# Patient Record
Sex: Female | Born: 1990 | Race: Black or African American | Hispanic: No | Marital: Single | Smoking: Never smoker
Health system: Southern US, Community
[De-identification: ages and names within clinical notes are randomized; demographics above are authoritative.]

## PROBLEM LIST (undated history)

## (undated) ENCOUNTER — Inpatient Hospital Stay (HOSPITAL_COMMUNITY): Payer: Self-pay

## (undated) DIAGNOSIS — Z789 Other specified health status: Secondary | ICD-10-CM

## (undated) DIAGNOSIS — N390 Urinary tract infection, site not specified: Secondary | ICD-10-CM

## (undated) DIAGNOSIS — Z8619 Personal history of other infectious and parasitic diseases: Secondary | ICD-10-CM

## (undated) HISTORY — PX: NO PAST SURGERIES: SHX2092

---
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
## 2011-03-03 LAB — URINALYSIS, ROUTINE W REFLEX MICROSCOPIC
Ketones, ur: NEGATIVE mg/dL
Nitrite: NEGATIVE
Protein, ur: NEGATIVE mg/dL
Urobilinogen, UA: 0.2 mg/dL (ref 0.0–1.0)

## 2011-03-03 LAB — WET PREP, GENITAL

## 2011-03-03 LAB — CBC
HCT: 32.2 % — ABNORMAL LOW (ref 36.0–49.0)
Hemoglobin: 10.6 g/dL — ABNORMAL LOW (ref 12.0–16.0)
RBC: 3.9 MIL/uL (ref 3.80–5.70)
RDW: 13.4 % (ref 11.4–15.5)
WBC: 4.7 10*3/uL (ref 4.5–13.5)

## 2011-03-11 LAB — RAPID STREP SCREEN (MED CTR MEBANE ONLY): Streptococcus, Group A Screen (Direct): NEGATIVE

## 2011-04-02 LAB — URINE CULTURE
Colony Count: NO GROWTH
Culture: NO GROWTH

## 2011-04-02 LAB — URINALYSIS, ROUTINE W REFLEX MICROSCOPIC
Bilirubin Urine: NEGATIVE
Bilirubin Urine: NEGATIVE
Glucose, UA: NEGATIVE mg/dL
Hgb urine dipstick: NEGATIVE
Ketones, ur: NEGATIVE mg/dL
Ketones, ur: NEGATIVE mg/dL
Protein, ur: NEGATIVE mg/dL
Urobilinogen, UA: 0.2 mg/dL (ref 0.0–1.0)
pH: 6 (ref 5.0–8.0)

## 2011-04-02 LAB — WET PREP, GENITAL
Clue Cells Wet Prep HPF POC: NONE SEEN
Trich, Wet Prep: NONE SEEN

## 2011-04-02 LAB — URINE MICROSCOPIC-ADD ON

## 2011-04-02 LAB — POCT PREGNANCY, URINE: Preg Test, Ur: POSITIVE

## 2011-04-07 LAB — WET PREP, GENITAL: Trich, Wet Prep: NONE SEEN

## 2011-04-07 LAB — URINE MICROSCOPIC-ADD ON

## 2011-04-07 LAB — URINALYSIS, ROUTINE W REFLEX MICROSCOPIC
Hgb urine dipstick: NEGATIVE
Ketones, ur: 15 mg/dL — AB
Protein, ur: 100 mg/dL — AB
Urobilinogen, UA: 2 mg/dL — ABNORMAL HIGH (ref 0.0–1.0)

## 2011-04-07 LAB — URINE CULTURE: Colony Count: 7000

## 2011-04-07 LAB — RAPID STREP SCREEN (MED CTR MEBANE ONLY): Streptococcus, Group A Screen (Direct): NEGATIVE

## 2011-04-07 LAB — GC/CHLAMYDIA PROBE AMP, GENITAL: Chlamydia, DNA Probe: POSITIVE — AB

## 2011-07-18 ENCOUNTER — Emergency Department (HOSPITAL_COMMUNITY)
Admission: EM | Admit: 2011-07-18 | Discharge: 2011-07-19 | Disposition: A | Discharge status: Home / Self Care | Payer: Medicaid Other | Attending: Emergency Medicine | Admitting: Emergency Medicine

## 2011-07-18 DIAGNOSIS — R109 Unspecified abdominal pain: Secondary | ICD-10-CM | POA: Insufficient documentation

## 2011-07-18 DIAGNOSIS — R300 Dysuria: Secondary | ICD-10-CM | POA: Insufficient documentation

## 2011-07-18 DIAGNOSIS — N390 Urinary tract infection, site not specified: Secondary | ICD-10-CM | POA: Insufficient documentation

## 2011-07-18 LAB — URINE MICROSCOPIC-ADD ON

## 2011-07-18 LAB — URINALYSIS, ROUTINE W REFLEX MICROSCOPIC
Bilirubin Urine: NEGATIVE
Glucose, UA: NEGATIVE mg/dL
Ketones, ur: 15 mg/dL — AB
Protein, ur: 100 mg/dL — AB
Urobilinogen, UA: 1 mg/dL (ref 0.0–1.0)

## 2011-09-17 ENCOUNTER — Emergency Department (HOSPITAL_COMMUNITY)
Admission: EM | Admit: 2011-09-17 | Discharge: 2011-09-18 | Disposition: A | Discharge status: Home / Self Care | Payer: Medicaid Other | Attending: Emergency Medicine | Admitting: Emergency Medicine

## 2011-09-17 DIAGNOSIS — R109 Unspecified abdominal pain: Secondary | ICD-10-CM | POA: Insufficient documentation

## 2011-09-17 DIAGNOSIS — N390 Urinary tract infection, site not specified: Secondary | ICD-10-CM | POA: Insufficient documentation

## 2011-09-17 DIAGNOSIS — A499 Bacterial infection, unspecified: Secondary | ICD-10-CM | POA: Insufficient documentation

## 2011-09-17 DIAGNOSIS — O219 Vomiting of pregnancy, unspecified: Secondary | ICD-10-CM | POA: Insufficient documentation

## 2011-09-17 DIAGNOSIS — O2390 Unspecified genitourinary tract infection in pregnancy, unspecified trimester: Secondary | ICD-10-CM | POA: Insufficient documentation

## 2011-09-17 DIAGNOSIS — N760 Acute vaginitis: Secondary | ICD-10-CM | POA: Insufficient documentation

## 2011-09-17 DIAGNOSIS — B9689 Other specified bacterial agents as the cause of diseases classified elsewhere: Secondary | ICD-10-CM | POA: Insufficient documentation

## 2011-09-17 LAB — URINALYSIS, ROUTINE W REFLEX MICROSCOPIC
Bilirubin Urine: NEGATIVE
Nitrite: NEGATIVE
Specific Gravity, Urine: 1.011 (ref 1.005–1.030)
Urobilinogen, UA: 1 mg/dL (ref 0.0–1.0)
pH: 6.5 (ref 5.0–8.0)

## 2011-09-17 LAB — URINE MICROSCOPIC-ADD ON

## 2011-09-17 LAB — POCT I-STAT, CHEM 8
BUN: 5 mg/dL — ABNORMAL LOW (ref 6–23)
Chloride: 104 mEq/L (ref 96–112)
HCT: 35 % — ABNORMAL LOW (ref 36.0–46.0)
Potassium: 3.6 mEq/L (ref 3.5–5.1)
Sodium: 137 mEq/L (ref 135–145)

## 2011-09-17 LAB — POCT PREGNANCY, URINE: Preg Test, Ur: POSITIVE

## 2011-09-18 ENCOUNTER — Emergency Department (HOSPITAL_COMMUNITY): Payer: Medicaid Other

## 2011-09-18 LAB — WET PREP, GENITAL

## 2011-09-19 LAB — GC/CHLAMYDIA PROBE AMP, GENITAL
Chlamydia, DNA Probe: NEGATIVE
GC Probe Amp, Genital: NEGATIVE

## 2011-09-20 LAB — URINE CULTURE

## 2011-12-22 NOTE — L&D Delivery Note (Signed)
Delivery Note At 7:42 PM a viable female was delivered via Vaginal, Spontaneous Delivery (Presentation: ; Occiput Anterior).  APGAR: 8, 9; weight .   Placenta status: Intact, Spontaneous.  Cord:  with the following complications: None.  Cord pH: none  Anesthesia: Epidural  Episiotomy: None Lacerations: None Suture Repair: none Est. Blood Loss (mL): 350  Mom to postpartum.  Baby to nursery-stable.  HARPER,CHARLES A 09/02/2012, 7:59 PM

## 2012-02-29 ENCOUNTER — Encounter (HOSPITAL_COMMUNITY): Payer: Self-pay | Admitting: *Deleted

## 2012-02-29 ENCOUNTER — Emergency Department (HOSPITAL_COMMUNITY)
Admission: EM | Admit: 2012-02-29 | Discharge: 2012-03-01 | Disposition: A | Discharge status: Home / Self Care | Payer: Medicaid Other | Attending: Emergency Medicine | Admitting: Emergency Medicine

## 2012-02-29 DIAGNOSIS — O2390 Unspecified genitourinary tract infection in pregnancy, unspecified trimester: Secondary | ICD-10-CM | POA: Insufficient documentation

## 2012-02-29 DIAGNOSIS — O2340 Unspecified infection of urinary tract in pregnancy, unspecified trimester: Secondary | ICD-10-CM

## 2012-02-29 DIAGNOSIS — N390 Urinary tract infection, site not specified: Secondary | ICD-10-CM | POA: Insufficient documentation

## 2012-02-29 NOTE — ED Notes (Signed)
The pt has had vomiting and diarrhea for 2 weeks intermittently with abd pain.  lmp  unknown

## 2012-03-01 LAB — DIFFERENTIAL
Basophils Absolute: 0 10*3/uL (ref 0.0–0.1)
Eosinophils Relative: 1 % (ref 0–5)
Lymphocytes Relative: 27 % (ref 12–46)
Lymphs Abs: 2.6 10*3/uL (ref 0.7–4.0)
Monocytes Absolute: 0.8 10*3/uL (ref 0.1–1.0)
Neutro Abs: 6.1 10*3/uL (ref 1.7–7.7)

## 2012-03-01 LAB — CBC
HCT: 32.4 % — ABNORMAL LOW (ref 36.0–46.0)
MCV: 81 fL (ref 78.0–100.0)
Platelets: 208 10*3/uL (ref 150–400)
RBC: 4 MIL/uL (ref 3.87–5.11)
WBC: 9.7 10*3/uL (ref 4.0–10.5)

## 2012-03-01 LAB — URINALYSIS, ROUTINE W REFLEX MICROSCOPIC
Glucose, UA: NEGATIVE mg/dL
Hgb urine dipstick: NEGATIVE
Specific Gravity, Urine: 1.029 (ref 1.005–1.030)

## 2012-03-01 LAB — URINE MICROSCOPIC-ADD ON

## 2012-03-01 LAB — BASIC METABOLIC PANEL
CO2: 22 mEq/L (ref 19–32)
Calcium: 9.4 mg/dL (ref 8.4–10.5)
Chloride: 102 mEq/L (ref 96–112)
Glucose, Bld: 97 mg/dL (ref 70–99)
Sodium: 135 mEq/L (ref 135–145)

## 2012-03-01 LAB — POCT PREGNANCY, URINE: Preg Test, Ur: POSITIVE — AB

## 2012-03-01 MED ORDER — NITROFURANTOIN MONOHYD MACRO 100 MG PO CAPS: 100.0000 mg | ORAL_CAPSULE | Freq: Two times a day (BID) | ORAL | Status: AC

## 2012-03-01 NOTE — ED Provider Notes (Signed)
History     CSN: [id number]  Arrival date & time 02/29/12  2342   First MD Initiated Contact with Patient 03/01/12 0225      Chief Complaint  Patient presents with  . Abdominal Pain    HPI Patient presents with nausea and vomiting.  She notes her symptoms began gradually couple weeks ago.  Since onset there has been intermittent, with emesis.  She has mild anorexia.  There is intermittent diarrhea as well.  No fevers, no chills, no dysuria.  She notes mild intermittent lower abdominal pain, focally in the suprapubic region, dull.  No attempts at analgesia with OTC medication, no clear exacerbating, provoking, alleviating factors.  The patient notes her last menstrual period was 2 months ago.  She has one prior pregnancy. History reviewed. No pertinent past medical history.  History reviewed. No pertinent past surgical history.  History reviewed. No pertinent family history.  History  Substance Use Topics  . Smoking status: Not on file  . Smokeless tobacco: Not on file  . Alcohol Use: No    OB History    Grav Para Term Preterm Abortions TAB SAB Ect Mult Living                  Review of Systems  All other systems reviewed and are negative.    Allergies  Penicillins  Home Medications  No current outpatient prescriptions on file.  BP 115/62  Pulse 86  Temp(Src) 99.4 F (37.4 C) (Oral)  Resp 18  SpO2 100%  Physical Exam  Nursing note and vitals reviewed. Constitutional: She is oriented to person, place, and time. She appears well-developed and well-nourished. No distress.  HENT:  Head: Normocephalic and atraumatic.  Eyes: Conjunctivae and EOM are normal.  Cardiovascular: Normal rate and regular rhythm.   Pulmonary/Chest: Effort normal and breath sounds normal. No stridor. No respiratory distress.  Abdominal: She exhibits no distension.  Musculoskeletal: She exhibits no edema.  Neurological: She is alert and oriented to person, place, and time. No cranial  nerve deficit.  Skin: Skin is warm and dry.  Psychiatric: She has a normal mood and affect.    ED Course  Procedures (including critical care time)  Labs Reviewed  URINALYSIS, ROUTINE W REFLEX MICROSCOPIC - Abnormal; Notable for the following:    APPearance CLOUDY (*)    Leukocytes, UA SMALL (*)    All other components within normal limits  CBC - Abnormal; Notable for the following:    Hemoglobin 10.9 (*)    HCT 32.4 (*)    All other components within normal limits  POCT PREGNANCY, URINE - Abnormal; Notable for the following:    Preg Test, Ur POSITIVE (*)    All other components within normal limits  URINE MICROSCOPIC-ADD ON - Abnormal; Notable for the following:    Bacteria, UA FEW (*)    All other components within normal limits  DIFFERENTIAL  BASIC METABOLIC PANEL   No results found.   No diagnosis found.  Bedside ultrasound demonstrates IUP with fetus moving freely.  Fetal heart tones 180.  MDM  This generally well-appearing 21 year old female presents with nausea, vomiting, minor abdominal pain.  On exam she is in no distress with unremarkable vital signs.  The patient's labs are notable for a positive urine pregnancy results.  This was discussed with the patient, who notes that she is not surprised.  The patient also has a urinary tract infection.  She was discharged in stable condition with instructions to follow  up at [facility].  She was provided antibiotics.       Gerhard Munch, MD 03/01/12 [phone]

## 2012-03-02 ENCOUNTER — Encounter (HOSPITAL_COMMUNITY): Payer: Self-pay | Admitting: Obstetrics and Gynecology

## 2012-03-02 ENCOUNTER — Inpatient Hospital Stay (HOSPITAL_COMMUNITY): Admission: AD | Admit: 2012-03-02 | Payer: Medicaid Other | Admitting: Obstetrics & Gynecology

## 2012-04-27 ENCOUNTER — Encounter (HOSPITAL_COMMUNITY): Payer: Self-pay | Admitting: *Deleted

## 2012-04-27 ENCOUNTER — Inpatient Hospital Stay (HOSPITAL_COMMUNITY)
Admission: AD | Admit: 2012-04-27 | Discharge: 2012-04-28 | Disposition: A | Discharge status: Home / Self Care | Payer: Medicaid Other | Source: Ambulatory Visit | Attending: Obstetrics & Gynecology | Admitting: Obstetrics & Gynecology

## 2012-04-27 DIAGNOSIS — R300 Dysuria: Secondary | ICD-10-CM | POA: Insufficient documentation

## 2012-04-27 DIAGNOSIS — O99891 Other specified diseases and conditions complicating pregnancy: Secondary | ICD-10-CM | POA: Insufficient documentation

## 2012-04-27 DIAGNOSIS — O9989 Other specified diseases and conditions complicating pregnancy, childbirth and the puerperium: Secondary | ICD-10-CM

## 2012-04-27 DIAGNOSIS — N949 Unspecified condition associated with female genital organs and menstrual cycle: Secondary | ICD-10-CM

## 2012-04-27 DIAGNOSIS — R109 Unspecified abdominal pain: Secondary | ICD-10-CM | POA: Insufficient documentation

## 2012-04-27 DIAGNOSIS — R102 Pelvic and perineal pain: Secondary | ICD-10-CM

## 2012-04-27 HISTORY — DX: Other specified health status: Z78.9

## 2012-04-27 NOTE — MAU Note (Signed)
Pt states she is having back pain that started about 3wks ago. Pt states after she works she feels pressure, because she is working now. Pt states she wants to make sure everything is ok

## 2012-04-27 NOTE — MAU Note (Signed)
Pt presents with complaint of sharp pain off/on x 1 month, has not started care yet. Was seen at [facility] 2 months ago and was told she had a UTI but was unable to get meds filled .

## 2012-04-28 LAB — URINALYSIS, ROUTINE W REFLEX MICROSCOPIC
Bilirubin Urine: NEGATIVE
Ketones, ur: NEGATIVE mg/dL
Leukocytes, UA: NEGATIVE
Nitrite: NEGATIVE
Protein, ur: NEGATIVE mg/dL
Urobilinogen, UA: 0.2 mg/dL (ref 0.0–1.0)
pH: 6 (ref 5.0–8.0)

## 2012-04-28 NOTE — MAU Provider Note (Signed)
  History    CSN: [id number]  Arrival date and time: 04/27/12 2209  Chief Complaint  Patient presents with  . Abdominal Pain   HPI 21 y.o. G2P1001 at [redacted]w[redacted]d who complains of lower abdominal pain and dysuria. No fevers, hematuria, flank pain or other concerns.  Was seen in March 2013 in the ED and given Rx for a UTI even though her UA was negative. She reprots similar symptoms to that episode. Denies vaginal bleeding, discharge or other symptoms. No PNC yet, will contact [facility] soon.   Past Medical History  Diagnosis Date  . No pertinent past medical history     Past Surgical History  Procedure Date  . No past surgeries     No family history on file.  History  Substance Use Topics  . Smoking status: Former Smoker    Quit date: 08/29/2011  . Smokeless tobacco: Not on file  . Alcohol Use: No   Allergies:  Allergies  Allergen Reactions  . Penicillins Other (See Comments)    unknown    Prescriptions prior to admission  Medication Sig Dispense Refill  . Prenatal Vit-Fe Fumarate-FA (PRENATAL MULTIVITAMIN) TABS Take 1 tablet by mouth daily.        ROS Physical Exam   Blood pressure 114/59, pulse 81, temperature 98.4 F (36.9 C), temperature source Oral, resp. rate 18, SpO2 100.00%.  Reassuring FHR on doppler.  Physical Exam  Constitutional: She is oriented to person, place, and time.  GI: Soft. Bowel sounds are normal. She exhibits no distension. There is tenderness.       Moderate suprapubic pain  Genitourinary:       Normal bimanual exam. Cervix long/thick/closed  Neurological: She is alert and oriented to person, place, and time.    MAU Course  Procedures  Results for orders placed during the hospital encounter of 04/27/12 (from the past 24 hour(s))  URINALYSIS, ROUTINE W REFLEX MICROSCOPIC     Status: Normal   Collection Time   04/27/12 10:30 PM      Component Value Range   Color, Urine YELLOW  YELLOW    APPearance CLEAR  CLEAR    Specific Gravity, Urine  1.025  1.005 - 1.030    pH 6.0  5.0 - 8.0    Glucose, UA NEGATIVE  NEGATIVE (mg/dL)   Hgb urine dipstick NEGATIVE  NEGATIVE    Bilirubin Urine NEGATIVE  NEGATIVE    Ketones, ur NEGATIVE  NEGATIVE (mg/dL)   Protein, ur NEGATIVE  NEGATIVE (mg/dL)   Urobilinogen, UA 0.2  0.0 - 1.0 (mg/dL)   Nitrite NEGATIVE  NEGATIVE    Leukocytes, UA NEGATIVE  NEGATIVE     Assessment and Plan  No evidence of UTI or pretem labor.  Precautions removed. Likely round ligament pain Start prenatal care   Keylan Costabile A 04/28/2012, 12:35 AM

## 2012-04-28 NOTE — Discharge Instructions (Signed)

## 2012-06-02 ENCOUNTER — Other Ambulatory Visit: Payer: Self-pay | Admitting: Obstetrics

## 2012-06-02 DIAGNOSIS — Z3480 Encounter for supervision of other normal pregnancy, unspecified trimester: Secondary | ICD-10-CM

## 2012-06-02 LAB — OB RESULTS CONSOLE ABO/RH: RH Type: NEGATIVE

## 2012-06-02 LAB — OB RESULTS CONSOLE RPR: RPR: NONREACTIVE

## 2012-06-02 LAB — OB RESULTS CONSOLE GC/CHLAMYDIA
Chlamydia: NEGATIVE
Gonorrhea: NEGATIVE

## 2012-06-02 LAB — OB RESULTS CONSOLE HEPATITIS B SURFACE ANTIGEN: Hepatitis B Surface Ag: NEGATIVE

## 2012-06-08 ENCOUNTER — Ambulatory Visit (HOSPITAL_COMMUNITY)
Admission: RE | Admit: 2012-06-08 | Discharge: 2012-06-08 | Disposition: A | Discharge status: Home / Self Care | Payer: Medicaid Other | Source: Ambulatory Visit | Attending: Obstetrics | Admitting: Obstetrics

## 2012-06-08 DIAGNOSIS — Z1389 Encounter for screening for other disorder: Secondary | ICD-10-CM | POA: Insufficient documentation

## 2012-06-08 DIAGNOSIS — Z3480 Encounter for supervision of other normal pregnancy, unspecified trimester: Secondary | ICD-10-CM

## 2012-06-08 DIAGNOSIS — O0930 Supervision of pregnancy with insufficient antenatal care, unspecified trimester: Secondary | ICD-10-CM | POA: Insufficient documentation

## 2012-06-08 DIAGNOSIS — O358XX0 Maternal care for other (suspected) fetal abnormality and damage, not applicable or unspecified: Secondary | ICD-10-CM | POA: Insufficient documentation

## 2012-06-08 DIAGNOSIS — Z363 Encounter for antenatal screening for malformations: Secondary | ICD-10-CM | POA: Insufficient documentation

## 2012-06-25 ENCOUNTER — Inpatient Hospital Stay (HOSPITAL_COMMUNITY)
Admission: AD | Admit: 2012-06-25 | Discharge: 2012-06-25 | Disposition: A | Discharge status: Home / Self Care | Payer: Medicaid Other | Source: Ambulatory Visit | Attending: Obstetrics | Admitting: Obstetrics

## 2012-06-25 DIAGNOSIS — Z3480 Encounter for supervision of other normal pregnancy, unspecified trimester: Secondary | ICD-10-CM | POA: Insufficient documentation

## 2012-06-25 DIAGNOSIS — Z298 Encounter for other specified prophylactic measures: Secondary | ICD-10-CM | POA: Insufficient documentation

## 2012-06-25 DIAGNOSIS — Z2989 Encounter for other specified prophylactic measures: Secondary | ICD-10-CM | POA: Insufficient documentation

## 2012-06-25 MED ORDER — RHO D IMMUNE GLOBULIN 1500 UNIT/2ML IJ SOLN
300.0000 ug | Freq: Once | INTRAMUSCULAR | Status: AC
  Administered 2012-06-25: 300 ug via INTRAMUSCULAR
  Filled 2012-06-25: qty 2

## 2012-06-26 LAB — RH IG WORKUP (INCLUDES ABO/RH)

## 2012-08-07 LAB — OB RESULTS CONSOLE GBS: GBS: NEGATIVE

## 2012-08-23 ENCOUNTER — Encounter (HOSPITAL_COMMUNITY): Payer: Self-pay | Admitting: *Deleted

## 2012-08-23 ENCOUNTER — Inpatient Hospital Stay (HOSPITAL_COMMUNITY)
Admission: AD | Admit: 2012-08-23 | Discharge: 2012-08-24 | Disposition: A | Discharge status: Home / Self Care | Payer: Medicaid Other | Source: Ambulatory Visit | Attending: Obstetrics | Admitting: Obstetrics

## 2012-08-23 DIAGNOSIS — O479 False labor, unspecified: Secondary | ICD-10-CM | POA: Insufficient documentation

## 2012-08-23 NOTE — MAU Note (Signed)
Pt G2 P1 at 39.3wks reports leaking fluid x 1 hr and having contractions every 25 min.

## 2012-08-31 ENCOUNTER — Encounter (HOSPITAL_COMMUNITY): Payer: Self-pay

## 2012-08-31 ENCOUNTER — Inpatient Hospital Stay (HOSPITAL_COMMUNITY)
Admission: RE | Admit: 2012-08-31 | Discharge: 2012-09-04 | DRG: 775 | Disposition: A | Discharge status: Home / Self Care | Payer: Medicaid Other | Source: Ambulatory Visit | Attending: Obstetrics & Gynecology | Admitting: Obstetrics & Gynecology

## 2012-08-31 ENCOUNTER — Other Ambulatory Visit: Payer: Self-pay | Admitting: Obstetrics

## 2012-08-31 ENCOUNTER — Telehealth (HOSPITAL_COMMUNITY): Payer: Self-pay | Admitting: *Deleted

## 2012-08-31 ENCOUNTER — Encounter (HOSPITAL_COMMUNITY): Payer: Self-pay | Admitting: *Deleted

## 2012-08-31 DIAGNOSIS — O480 Post-term pregnancy: Principal | ICD-10-CM | POA: Diagnosis present

## 2012-08-31 LAB — CBC
HCT: 31.4 % — ABNORMAL LOW (ref 36.0–46.0)
MCHC: 32.8 g/dL (ref 30.0–36.0)
MCV: 87.5 fL (ref 78.0–100.0)
RDW: 14.1 % (ref 11.5–15.5)

## 2012-08-31 MED ORDER — LACTATED RINGERS IV SOLN: 500.0000 mL | INTRAVENOUS | Status: DC | PRN

## 2012-08-31 MED ORDER — MISOPROSTOL 25 MCG QUARTER TABLET
25.0000 ug | ORAL_TABLET | ORAL | Status: DC | PRN
  Administered 2012-08-31: 25 ug via VAGINAL
  Filled 2012-08-31: qty 1
  Filled 2012-08-31: qty 0.25

## 2012-08-31 MED ORDER — IBUPROFEN 600 MG PO TABS
600.0000 mg | ORAL_TABLET | Freq: Four times a day (QID) | ORAL | Status: DC | PRN
  Administered 2012-09-02: 600 mg via ORAL
  Filled 2012-08-31: qty 1

## 2012-08-31 MED ORDER — ONDANSETRON HCL 4 MG/2ML IJ SOLN: 4.0000 mg | Freq: Four times a day (QID) | INTRAMUSCULAR | Status: DC | PRN

## 2012-08-31 MED ORDER — CITRIC ACID-SODIUM CITRATE 334-500 MG/5ML PO SOLN: 30.0000 mL | ORAL | Status: DC | PRN

## 2012-08-31 MED ORDER — ZOLPIDEM TARTRATE 5 MG PO TABS
5.0000 mg | ORAL_TABLET | Freq: Every evening | ORAL | Status: DC | PRN
  Administered 2012-08-31: 5 mg via ORAL
  Filled 2012-08-31: qty 1

## 2012-08-31 MED ORDER — TERBUTALINE SULFATE 1 MG/ML IJ SOLN: 0.2500 mg | Freq: Once | INTRAMUSCULAR | Status: AC | PRN

## 2012-08-31 MED ORDER — OXYCODONE-ACETAMINOPHEN 5-325 MG PO TABS: 1.0000 | ORAL_TABLET | ORAL | Status: DC | PRN

## 2012-08-31 MED ORDER — OXYTOCIN BOLUS FROM INFUSION
500.0000 mL | Freq: Once | INTRAVENOUS | Status: DC
  Filled 2012-08-31: qty 500

## 2012-08-31 MED ORDER — LIDOCAINE HCL (PF) 1 % IJ SOLN
30.0000 mL | INTRAMUSCULAR | Status: AC | PRN
  Administered 2012-08-31: 30 mL via SUBCUTANEOUS
  Filled 2012-08-31 (×2): qty 30

## 2012-08-31 MED ORDER — ACETAMINOPHEN 325 MG PO TABS: 650.0000 mg | ORAL_TABLET | ORAL | Status: DC | PRN

## 2012-08-31 MED ORDER — OXYTOCIN 40 UNITS IN LACTATED RINGERS INFUSION - SIMPLE MED: 62.5000 mL/h | Freq: Once | INTRAVENOUS | Status: DC

## 2012-08-31 MED ORDER — LACTATED RINGERS IV SOLN
INTRAVENOUS | Status: DC
  Administered 2012-08-31 – 2012-09-02 (×3): via INTRAVENOUS

## 2012-08-31 NOTE — Telephone Encounter (Signed)
Preadmission screen  

## 2012-09-01 ENCOUNTER — Encounter (HOSPITAL_COMMUNITY): Payer: Self-pay

## 2012-09-01 LAB — TYPE AND SCREEN
ABO/RH(D): A NEG
Antibody Screen: POSITIVE
DAT, IgG: NEGATIVE

## 2012-09-01 MED ORDER — BUTORPHANOL TARTRATE 2 MG/ML IJ SOLN
2.0000 mg | INTRAMUSCULAR | Status: DC | PRN
  Administered 2012-09-01 – 2012-09-02 (×7): 2 mg via INTRAVENOUS
  Filled 2012-09-01 (×2): qty 2
  Filled 2012-09-01: qty 1
  Filled 2012-09-01: qty 2
  Filled 2012-09-01: qty 1
  Filled 2012-09-01 (×3): qty 2
  Filled 2012-09-01: qty 1

## 2012-09-01 NOTE — H&P (Signed)
Krystal Murray is a 21 y.o. female presenting for IOL. Maternal Medical History:  Reason for admission: 21 yo G3 P1.  EDC 08-27-12.  Presents for IOL for postdates.  Fetal activity: Perceived fetal activity is normal.   Last perceived fetal movement was within the past hour.    Prenatal complications: no prenatal complications   OB History    Grav Para Term Preterm Abortions TAB SAB Ect Mult Living   3 1 1  0 1 1 0 0 0 1     Past Medical History  Diagnosis Date  . No pertinent past medical history    Past Surgical History  Procedure Date  . No past surgeries    Family History: family history includes Cancer in her cousin, maternal aunt, and maternal grandmother; Diabetes in her maternal aunt; and Hypertension in her maternal aunt. Social History:  reports that she quit smoking about a year ago. She has never used smokeless tobacco. She reports that she does not drink alcohol or use illicit drugs.   Prenatal Transfer Tool  Maternal Diabetes: No Genetic Screening: Normal Maternal Ultrasounds/Referrals: Normal Fetal Ultrasounds or other Referrals:  None Maternal Substance Abuse:  No Significant Maternal Medications:  Meds include: Other:  PNV Significant Maternal Lab Results:  Lab values include: Group B Strep negative Other Comments:  None  Review of Systems  All other systems reviewed and are negative.    Dilation: Fingertip Effacement (%): 60 Station: -3 Exam by:: H.Norton, RN  Blood pressure 105/51, pulse 84, temperature 97.8 F (36.6 C), temperature source Oral, resp. rate 20, height 5\' 6"  (1.676 m), weight 102.513 kg (226 lb). Maternal Exam:  Abdomen: Patient reports no abdominal tenderness. Fetal presentation: vertex  Introitus: Normal vulva. Normal vagina.    Physical Exam  Nursing note and vitals reviewed. Constitutional: She is oriented to person, place, and time. She appears well-developed and well-nourished.  HENT:  Head: Normocephalic and atraumatic.   Eyes: Conjunctivae normal are normal. Pupils are equal, round, and reactive to light.  Neck: Normal range of motion. Neck supple.  Cardiovascular: Normal rate and regular rhythm.   Respiratory: Effort normal.  GI: Soft.  Genitourinary: Vagina normal and uterus normal.  Musculoskeletal: Normal range of motion.  Neurological: She is alert and oriented to person, place, and time.  Skin: Skin is warm and dry.  Psychiatric: She has a normal mood and affect. Her behavior is normal. Judgment and thought content normal.    Prenatal labs: ABO, Rh: --/--/A NEG (09/12 0400) Antibody: POS (09/12 0400) Rubella: Immune (06/13 0000) RPR: NON REACTIVE (09/11 2115)  HBsAg: Negative (06/13 0000)  HIV: Non-reactive (06/13 0000)  GBS: Negative (08/18 0000)   Assessment/Plan: 40.5 weeks.  2 stage IOL.   Krystal Murray A 09/01/2012, 8:30 AM

## 2012-09-02 ENCOUNTER — Encounter (HOSPITAL_COMMUNITY): Payer: Self-pay | Admitting: Anesthesiology

## 2012-09-02 ENCOUNTER — Inpatient Hospital Stay (HOSPITAL_COMMUNITY): Payer: Medicaid Other | Admitting: Anesthesiology

## 2012-09-02 ENCOUNTER — Encounter (HOSPITAL_COMMUNITY): Payer: Self-pay

## 2012-09-02 MED ORDER — OXYTOCIN 40 UNITS IN LACTATED RINGERS INFUSION - SIMPLE MED
1.0000 m[IU]/min | INTRAVENOUS | Status: DC
  Administered 2012-09-02: 1 m[IU]/min via INTRAVENOUS
  Filled 2012-09-02: qty 1000

## 2012-09-02 MED ORDER — BENZOCAINE-MENTHOL 20-0.5 % EX AERO
1.0000 "application " | INHALATION_SPRAY | CUTANEOUS | Status: DC | PRN
  Filled 2012-09-02: qty 56

## 2012-09-02 MED ORDER — EPHEDRINE 5 MG/ML INJ
10.0000 mg | INTRAVENOUS | Status: DC | PRN
  Filled 2012-09-02: qty 4

## 2012-09-02 MED ORDER — ZOLPIDEM TARTRATE 5 MG PO TABS: 5.0000 mg | ORAL_TABLET | Freq: Every evening | ORAL | Status: DC | PRN

## 2012-09-02 MED ORDER — DIBUCAINE 1 % RE OINT: 1.0000 "application " | TOPICAL_OINTMENT | RECTAL | Status: DC | PRN

## 2012-09-02 MED ORDER — DIPHENHYDRAMINE HCL 25 MG PO CAPS: 25.0000 mg | ORAL_CAPSULE | Freq: Four times a day (QID) | ORAL | Status: DC | PRN

## 2012-09-02 MED ORDER — TETANUS-DIPHTH-ACELL PERTUSSIS 5-2.5-18.5 LF-MCG/0.5 IM SUSP: 0.5000 mL | Freq: Once | INTRAMUSCULAR | Status: DC

## 2012-09-02 MED ORDER — PRENATAL MULTIVITAMIN CH
1.0000 | ORAL_TABLET | Freq: Every day | ORAL | Status: DC
  Administered 2012-09-03 – 2012-09-04 (×2): 1 via ORAL
  Filled 2012-09-02 (×2): qty 1

## 2012-09-02 MED ORDER — FENTANYL 2.5 MCG/ML BUPIVACAINE 1/10 % EPIDURAL INFUSION (WH - ANES)
14.0000 mL/h | INTRAMUSCULAR | Status: DC
  Administered 2012-09-02: 14 mL/h via EPIDURAL
  Filled 2012-09-02 (×3): qty 60

## 2012-09-02 MED ORDER — ONDANSETRON HCL 4 MG PO TABS: 4.0000 mg | ORAL_TABLET | ORAL | Status: DC | PRN

## 2012-09-02 MED ORDER — SIMETHICONE 80 MG PO CHEW: 80.0000 mg | CHEWABLE_TABLET | ORAL | Status: DC | PRN

## 2012-09-02 MED ORDER — LANOLIN HYDROUS EX OINT: TOPICAL_OINTMENT | CUTANEOUS | Status: DC | PRN

## 2012-09-02 MED ORDER — DIPHENHYDRAMINE HCL 50 MG/ML IJ SOLN: 12.5000 mg | INTRAMUSCULAR | Status: DC | PRN

## 2012-09-02 MED ORDER — LACTATED RINGERS IV SOLN: 500.0000 mL | Freq: Once | INTRAVENOUS | Status: DC

## 2012-09-02 MED ORDER — LIDOCAINE HCL (PF) 1 % IJ SOLN
INTRAMUSCULAR | Status: DC | PRN
  Administered 2012-09-02 (×2): 4 mL

## 2012-09-02 MED ORDER — IBUPROFEN 600 MG PO TABS
600.0000 mg | ORAL_TABLET | Freq: Four times a day (QID) | ORAL | Status: DC
  Administered 2012-09-03 – 2012-09-04 (×6): 600 mg via ORAL
  Filled 2012-09-02 (×6): qty 1

## 2012-09-02 MED ORDER — ONDANSETRON HCL 4 MG/2ML IJ SOLN: 4.0000 mg | INTRAMUSCULAR | Status: DC | PRN

## 2012-09-02 MED ORDER — PHENYLEPHRINE 40 MCG/ML (10ML) SYRINGE FOR IV PUSH (FOR BLOOD PRESSURE SUPPORT)
80.0000 ug | PREFILLED_SYRINGE | INTRAVENOUS | Status: DC | PRN
  Filled 2012-09-02: qty 5

## 2012-09-02 MED ORDER — MEDROXYPROGESTERONE ACETATE 150 MG/ML IM SUSP: 150.0000 mg | INTRAMUSCULAR | Status: DC | PRN

## 2012-09-02 MED ORDER — PHENYLEPHRINE 40 MCG/ML (10ML) SYRINGE FOR IV PUSH (FOR BLOOD PRESSURE SUPPORT): 80.0000 ug | PREFILLED_SYRINGE | INTRAVENOUS | Status: DC | PRN

## 2012-09-02 MED ORDER — FENTANYL 2.5 MCG/ML BUPIVACAINE 1/10 % EPIDURAL INFUSION (WH - ANES)
INTRAMUSCULAR | Status: DC | PRN
  Administered 2012-09-02: 14 mL/h via EPIDURAL

## 2012-09-02 MED ORDER — OXYTOCIN 40 UNITS IN LACTATED RINGERS INFUSION - SIMPLE MED: 62.5000 mL/h | INTRAVENOUS | Status: DC | PRN

## 2012-09-02 MED ORDER — WITCH HAZEL-GLYCERIN EX PADS: 1.0000 "application " | MEDICATED_PAD | CUTANEOUS | Status: DC | PRN

## 2012-09-02 MED ORDER — SENNOSIDES-DOCUSATE SODIUM 8.6-50 MG PO TABS
2.0000 | ORAL_TABLET | Freq: Every day | ORAL | Status: DC
  Administered 2012-09-03: 2 via ORAL

## 2012-09-02 MED ORDER — OXYCODONE-ACETAMINOPHEN 5-325 MG PO TABS
1.0000 | ORAL_TABLET | ORAL | Status: DC | PRN
  Administered 2012-09-03: 1 via ORAL
  Filled 2012-09-02: qty 1

## 2012-09-02 MED ORDER — EPHEDRINE 5 MG/ML INJ: 10.0000 mg | INTRAVENOUS | Status: DC | PRN

## 2012-09-02 NOTE — Anesthesia Procedure Notes (Signed)
Epidural Patient location during procedure: OB Start time: 09/02/2012 11:31 AM  Staffing Anesthesiologist: Reylene Stauder A. Performed by: anesthesiologist   Preanesthetic Checklist Completed: patient identified, site marked, surgical consent, pre-op evaluation, timeout performed, IV checked, risks and benefits discussed and monitors and equipment checked  Epidural Patient position: sitting Prep: site prepped and draped and DuraPrep Patient monitoring: continuous pulse ox and blood pressure Approach: midline Injection technique: LOR air  Needle:  Needle type: Tuohy  Needle gauge: 17 G Needle length: 9 cm and 9 Needle insertion depth: 6 cm Catheter type: closed end flexible Catheter size: 19 Gauge Catheter at skin depth: 11 cm Test dose: negative and Other  Assessment Events: blood not aspirated, injection not painful, no injection resistance, negative IV test and no paresthesia  Additional Notes Patient identified. Risks and benefits discussed including failed block, incomplete  Pain control, post dural puncture headache, nerve damage, paralysis, blood pressure Changes, nausea, vomiting, reactions to medications-both toxic and allergic and post Partum back pain. All questions were answered. Patient expressed understanding and wished to proceed. Sterile technique was used throughout procedure. Epidural site was Dressed with sterile barrier dressing. No paresthesias, signs of intravascular injection Or signs of intrathecal spread were encountered.  Patient was more comfortable after the epidural was dosed. Please see RN's note for documentation of vital signs and FHR which are stable.

## 2012-09-02 NOTE — Progress Notes (Signed)
Krystal Murray is a 21 y.o. G3P1011 at [redacted]w[redacted]d by LMP admitted for induction of labor due to Post dates. Due date 08-27-12.  Subjective:   Objective: BP 101/88  Pulse 87  Temp 98.2 F (36.8 C) (Oral)  Resp 20  Ht 5\' 6"  (1.676 m)  Wt 102.513 kg (226 lb)  BMI 36.48 kg/m2  SpO2 98%      FHT:  FHR: 150 bpm, variability: moderate,  accelerations:  Present,  decelerations:  Absent UC:   regular, every 2-3 minutes SVE:   Dilation: 5 Effacement (%): 80 Station: -2 Exam by:: Erline Hau RNC  Labs: Lab Results  Component Value Date   WBC 12.3* 08/31/2012   HGB 10.3* 08/31/2012   HCT 31.4* 08/31/2012   MCV 87.5 08/31/2012   PLT 179 08/31/2012    Assessment / Plan: Induction of labor due to postterm,  progressing well on pitocin  Labor: Progressing on Pitocin, will continue to increase then AROM Preeclampsia:  n/a Fetal Wellbeing:  Category I Pain Control:  Epidural I/D:  n/a Anticipated MOD:  NSVD  Gracin Mcpartland A 09/02/2012, 12:26 PM

## 2012-09-02 NOTE — Anesthesia Preprocedure Evaluation (Signed)
Anesthesia Evaluation  Patient identified by MRN, date of birth, ID band Patient awake    Reviewed: Allergy & Precautions, H&P , Patient's Chart, lab work & pertinent test results  Airway Mallampati: III TM Distance: >3 FB Neck ROM: full    Dental No notable dental hx. (+) Teeth Intact   Pulmonary neg pulmonary ROS,  breath sounds clear to auscultation  Pulmonary exam normal       Cardiovascular negative cardio ROS  Rhythm:regular Rate:Normal     Neuro/Psych negative neurological ROS  negative psych ROS   GI/Hepatic negative GI ROS, Neg liver ROS,   Endo/Other  Obesity  Renal/GU negative Renal ROS  negative genitourinary   Musculoskeletal   Abdominal   Peds  Hematology negative hematology ROS (+)   Anesthesia Other Findings   Reproductive/Obstetrics (+) Pregnancy                           Anesthesia Physical Anesthesia Plan  ASA: II  Anesthesia Plan: Epidural   Post-op Pain Management:    Induction:   Airway Management Planned:   Additional Equipment:   Intra-op Plan:   Post-operative Plan:   Informed Consent: I have reviewed the patients History and Physical, chart, labs and discussed the procedure including the risks, benefits and alternatives for the proposed anesthesia with the patient or authorized representative who has indicated his/her understanding and acceptance.     Plan Discussed with: Anesthesiologist  Anesthesia Plan Comments:         Anesthesia Quick Evaluation

## 2012-09-03 ENCOUNTER — Encounter (HOSPITAL_COMMUNITY): Payer: Self-pay

## 2012-09-03 LAB — CBC
HCT: 29.6 % — ABNORMAL LOW (ref 36.0–46.0)
MCH: 28.2 pg (ref 26.0–34.0)
MCV: 87.1 fL (ref 78.0–100.0)
Platelets: 165 10*3/uL (ref 150–400)
RBC: 3.4 MIL/uL — ABNORMAL LOW (ref 3.87–5.11)

## 2012-09-03 MED ORDER — RHO D IMMUNE GLOBULIN 1500 UNIT/2ML IJ SOLN
300.0000 ug | Freq: Once | INTRAMUSCULAR | Status: AC
  Administered 2012-09-03: 300 ug via INTRAMUSCULAR
  Filled 2012-09-03: qty 2

## 2012-09-03 NOTE — Anesthesia Postprocedure Evaluation (Signed)
  Anesthesia Post-op Note  Patient: Krystal Murray  Procedure(s) Performed: * No procedures listed *  Patient Location: Mother/Baby  Anesthesia Type: Epidural  Level of Consciousness: awake, alert  and oriented  Airway and Oxygen Therapy: Patient Spontanous Breathing  Post-op Pain: mild  Post-op Assessment: Patient's Cardiovascular Status Stable and Respiratory Function Stable  Post-op Vital Signs: stable  Complications: No apparent anesthesia complications

## 2012-09-03 NOTE — Progress Notes (Signed)
Post Partum Day 1 Subjective: no complaints, up ad lib, voiding and tolerating PO  Objective: Blood pressure 103/62, pulse 82, temperature 98 F (36.7 C), temperature source Axillary, resp. rate 18, height 5\' 6"  (1.676 m), weight 102.513 kg (226 lb), SpO2 98.00%, unknown if currently breastfeeding.  Physical Exam:  General: alert and no distress Lochia: appropriate Uterine Fundus: firm Incision: healing well DVT Evaluation: No evidence of DVT seen on physical exam.   Basename 09/03/12 0550 08/31/12 2115  HGB 9.6* 10.3*  HCT 29.6* 31.4*    Assessment/Plan: Plan for discharge tomorrow   LOS: 3 days   HARPER,CHARLES A 09/03/2012, 10:31 AM

## 2012-09-03 NOTE — Clinical Social Work Note (Signed)
Clinical Social Work Department PSYCHOSOCIAL ASSESSMENT - MATERNAL/CHILD Name:  Krystal Murray Age: 20 day Referral Date:  09/02/12 Referral source:  MD Referral reason: [employer]  I. FAMILY/HOME ENVIRONMENT  Child's legal guardian: Parent(s)  Krystal Murray parent    DSS  Name:  Krystal Murray Age:  21 y/o Address: [address], [place], [place] [address] Name:  FOB is incarcerated  Other Household Members/Support Persons Name  Krystal Murray Relationship  Aunt DOB  45 y/o        Name  Krystal Murray                   Relationship  Dtr                   DOB  21 y/o  C.   Other Support   II. PSYCHOSOCIAL DATA A. Information Source  Patient Interview X  Family Interview           Other B. Event organiser Employment   [employer]  Yes   [employer]                                  Self Pay  Food Stamps     Yes WIC  Yes Work Scientist, physiological Housing      Section 8    Maternity Care Coordination/Child Service Coordination/Early Intervention   School Grade      Other Cultural and Environment Information Cultural Issues Impacting Care  III. STRENGTHS  Supportive family/friends Yes  Adequate Resources   Yes Compliance with medical plan  Home prepared for Child (including basic supplies)  Yes Understanding of illness           Other  IV. RISK FACTORS AND CURRENT PROBLEMS V. No Problems Noted VI. Substance Abuse                                           Pt Family VII. Mental Illness     Pt Family               Family/Relationship Issues   Pt Family      Abuse/Neglect/Domestic Violence   Pt Family   Financial Resources     Pt Family  Transportation     Pt Family  DSS Involvement    Pt Family  Adjustment to Illness    Pt Family   Knowledge/Cognitive Deficit   Pt Family   Compliance with Treatment   Pt Family   Basic Needs (food, housing, etc)  Pt Family  Housing Concerns    Pt Family  Other             VIII. SOCIAL  WORK ASSESSMENT SW received referral due to limited prenatal care.  Pt stated she had to wait 45 days after she found out she was pregnant to get Medicaid and receive PNC.  She stated she began taking prenatal vitamins when she found out she was pregnant.  Pt lives with aunt and two year old dtr.  The FOB is currently incarcerated.  Pt answered questions, but did not expound on any of her answers.  She displayed an appropriate affect.  Pt had the car seat in the room and stated she  has the supplies she needs at home.  Pt also reported having friends and family to help her.  SW informed pt of drug testing policy and she stated she understood.  Told her the baby's UDS was negative and the [facility] was pending.  Pt expressed no other needs or questions.  IX. SOCIAL WORK PLAN (In [place]) No Further Intervention Required/No Barriers to Discharge Psychosocial Support and Ongoing Assessment of Needs Patient/Family  Education Child Protective Services Report  [place]        Date Information/Referral to [facility]   Other

## 2012-09-04 LAB — RH IG WORKUP (INCLUDES ABO/RH): Unit division: 0

## 2012-09-04 MED ORDER — IBUPROFEN 600 MG PO TABS: 600.0000 mg | ORAL_TABLET | Freq: Four times a day (QID) | ORAL | Status: AC

## 2012-09-04 MED ORDER — OXYCODONE-ACETAMINOPHEN 5-325 MG PO TABS: 1.0000 | ORAL_TABLET | ORAL | Status: AC | PRN

## 2012-09-04 NOTE — Progress Notes (Signed)
Post Partum Day 2 Subjective: no complaints  Objective: Blood pressure 117/63, pulse 79, temperature 97.6 F (36.4 C), temperature source Oral, resp. rate 18, height 5\' 6"  (1.676 m), weight 102.513 kg (226 lb), SpO2 98.00%, unknown if currently breastfeeding.  Physical Exam:  General: alert and no distress Lochia: appropriate Uterine Fundus: firm Incision: healing well DVT Evaluation: No evidence of DVT seen on physical exam.   Basename 09/03/12 0550  HGB 9.6*  HCT 29.6*    Assessment/Plan: Discharge home   LOS: 4 days   HARPER,CHARLES A 09/04/2012, 7:20 AM

## 2012-09-04 NOTE — Discharge Summary (Signed)
Obstetric Discharge Summary Reason for Admission: induction of labor Prenatal Procedures: ultrasound Intrapartum Procedures: spontaneous vaginal delivery Postpartum Procedures: none Complications-Operative and Postpartum: none Hemoglobin  Date Value Range Status  09/03/2012 9.6* 12.0 - 15.0 g/dL Final     HCT  Date Value Range Status  09/03/2012 29.6* 36.0 - 46.0 % Final    Physical Exam:  General: alert and no distress Lochia: appropriate Uterine Fundus: firm Incision: none DVT Evaluation: No evidence of DVT seen on physical exam.  Discharge Diagnoses: Term Pregnancy-delivered  Discharge Information: Date: 09/04/2012 Activity: pelvic rest Diet: routine Medications: PNV, Ibuprofen, Colace and Iron Condition: stable Instructions: refer to practice specific booklet Discharge to: home Follow-up Information    Follow up with HARPER,CHARLES A, MD. Schedule an appointment as soon as possible for a visit in 6 weeks.   Contact information:   [address] [place] [address] [phone]          Newborn Data: Live born female  Birth Weight: 6 lb 0.5 oz (2735 g) APGAR: 8, 9  Home with mother.  HARPER,CHARLES A 09/04/2012, 7:28 AM

## 2012-09-05 NOTE — Progress Notes (Signed)
Post discharge chart review completed.  

## 2013-04-20 ENCOUNTER — Emergency Department (HOSPITAL_COMMUNITY)
Admission: EM | Admit: 2013-04-20 | Discharge: 2013-04-21 | Disposition: A | Discharge status: Home / Self Care | Payer: Self-pay | Attending: Emergency Medicine | Admitting: Emergency Medicine

## 2013-04-20 ENCOUNTER — Encounter (HOSPITAL_COMMUNITY): Payer: Self-pay | Admitting: Emergency Medicine

## 2013-04-20 DIAGNOSIS — R509 Fever, unspecified: Secondary | ICD-10-CM | POA: Insufficient documentation

## 2013-04-20 DIAGNOSIS — K029 Dental caries, unspecified: Secondary | ICD-10-CM | POA: Insufficient documentation

## 2013-04-20 DIAGNOSIS — R51 Headache: Secondary | ICD-10-CM | POA: Insufficient documentation

## 2013-04-20 DIAGNOSIS — R1310 Dysphagia, unspecified: Secondary | ICD-10-CM | POA: Insufficient documentation

## 2013-04-20 DIAGNOSIS — R059 Cough, unspecified: Secondary | ICD-10-CM | POA: Insufficient documentation

## 2013-04-20 DIAGNOSIS — Z87891 Personal history of nicotine dependence: Secondary | ICD-10-CM | POA: Insufficient documentation

## 2013-04-20 DIAGNOSIS — J020 Streptococcal pharyngitis: Secondary | ICD-10-CM | POA: Insufficient documentation

## 2013-04-20 DIAGNOSIS — R05 Cough: Secondary | ICD-10-CM | POA: Insufficient documentation

## 2013-04-20 LAB — RAPID STREP SCREEN (MED CTR MEBANE ONLY): Streptococcus, Group A Screen (Direct): POSITIVE — AB

## 2013-04-20 MED ORDER — CLINDAMYCIN HCL 150 MG PO CAPS: 150.0000 mg | ORAL_CAPSULE | Freq: Four times a day (QID) | ORAL | Status: DC

## 2013-04-20 MED ORDER — CLINDAMYCIN HCL 150 MG PO CAPS
300.0000 mg | ORAL_CAPSULE | Freq: Once | ORAL | Status: AC
  Administered 2013-04-20: 300 mg via ORAL
  Filled 2013-04-20: qty 2

## 2013-04-20 MED ORDER — HYDROCODONE-ACETAMINOPHEN 7.5-325 MG/15ML PO SOLN
10.0000 mL | Freq: Once | ORAL | Status: AC
  Administered 2013-04-20: 10 mL via ORAL
  Filled 2013-04-20: qty 15

## 2013-04-20 NOTE — ED Provider Notes (Signed)
History    This chart was scribed for non-physician practitioner working with Doug Sou, MD by Sofie Rower, ED Scribe. This patient was seen in room TR11C/TR11C and the patient's care was started at 10:35PM.   CSN: [id number]  Arrival date & time 04/20/13  2101   First MD Initiated Contact with Patient 04/20/13 2235      Chief Complaint  Patient presents with  . Sore Throat    (Consider location/radiation/quality/duration/timing/severity/associated sxs/prior treatment) The history is provided by the patient. No language interpreter was used.   Krystal Murray is a 22 y.o. female , with a hx of strep throat (1-2 times per year at baseline), who presents to the Emergency Department complaining of gradual, progressively worsening, sore throat, onset five days ago (04/15/13).  Associated symptoms include productive cough, difficulty swallowing and diffuse headache. The pt reports she has been experiencing a headache and sore throat for the past five days, which has become progressively worse, prompting her concern and desire to seek medical evaluation at [facility] this evening (04/20/13).  The pt denies experiencing any episodes of fever.   The pt does not smoke (quit on 08/29/2011) nor drink alcohol.   Pt does not have a PCP.    Past Medical History  Diagnosis Date  . No pertinent past medical history     Past Surgical History  Procedure Laterality Date  . No past surgeries      Family History  Problem Relation Age of Onset  . Cancer Maternal Aunt     uterine  . Diabetes Maternal Aunt   . Hypertension Maternal Aunt   . Cancer Maternal Grandmother     breastt  . Cancer Cousin     leaukemia    History  Substance Use Topics  . Smoking status: Former Smoker    Quit date: 08/29/2011  . Smokeless tobacco: Never Used  . Alcohol Use: No    OB History   Grav Para Term Preterm Abortions TAB SAB Ect Mult Living   3 2 2  0 1 1 0 0 0 2      Review of Systems  Constitutional:  Negative for fever.  HENT: Positive for sore throat and trouble swallowing.   Respiratory: Positive for cough.   Neurological: Positive for headaches.  All other systems reviewed and are negative.    Allergies  Penicillins  Home Medications   Current Outpatient Rx  Name  Route  Sig  Dispense  Refill  . OVER THE COUNTER MEDICATION      OTC fever reducer         . clindamycin (CLEOCIN) 150 MG capsule   Oral   Take 1 capsule (150 mg total) by mouth every 6 (six) hours.   28 capsule   0     BP 115/71  Pulse 82  Temp(Src) 99.5 F (37.5 C) (Oral)  Resp 16  SpO2 100%  LMP 03/24/2013  Physical Exam  Nursing note and vitals reviewed. Constitutional: She is oriented to person, place, and time. She appears well-developed and well-nourished. No distress.  HENT:  Head: Normocephalic and atraumatic.  Mouth/Throat: Uvula is midline, oropharynx is clear and moist and mucous membranes are normal. Normal dentition. Dental caries (Pts tooth shows no obvious abscess but moderate to severe tenderness to palpation of marked tooth) present. No edematous.  Eyes: EOM are normal. Pupils are equal, round, and reactive to light.  Neck: Trachea normal, normal range of motion and full passive range of motion without pain. Neck  supple. No tracheal deviation present.  Cardiovascular: Normal rate, regular rhythm, normal heart sounds and normal pulses.   Pulmonary/Chest: Effort normal and breath sounds normal. No respiratory distress. Chest wall is not dull to percussion. She exhibits no tenderness, no crepitus, no edema, no deformity and no retraction.  Abdominal: Normal appearance.  Musculoskeletal: Normal range of motion.  Neurological: She is alert and oriented to person, place, and time. She has normal strength.  Skin: Skin is warm, dry and intact. She is not diaphoretic.  Psychiatric: She has a normal mood and affect. Her speech is normal and behavior is normal. Cognition and memory are  normal.    ED Course  Procedures (including critical care time)  DIAGNOSTIC STUDIES: Oxygen Saturation is 100% on room air, normal by my interpretation.    COORDINATION OF CARE:  11:02 PM- Treatment plan discussed with patient. Pt agrees with treatment.      Results for orders placed during the hospital encounter of 04/20/13  RAPID STREP SCREEN      Result Value Range   Streptococcus, Group A Screen (Direct) POSITIVE (*) NEGATIVE    clindamycin (CLEOCIN) 150 MG capsule Take 1 capsule (150 mg total) by mouth every 6 (six) hours. 28 capsule Dorthula Matas, PA-C    No results found.   1. Strep pharyngitis       MDM  Pt has been advised of the symptoms that warrant their return to the ED. Patient has voiced understanding and has agreed to follow-up with the PCP or specialist.  I personally performed the services described in this documentation, which was scribed in my presence. The recorded information has been reviewed and is accurate.    Dorthula Matas, PA-C 04/20/13 2320

## 2013-04-20 NOTE — ED Notes (Signed)
PT. REPORTS SORE THROAT / HARD TO SWALLOW FOR 4 DAYS  WITH OCCASIONAL PRODUCTIVE COUGH . RESPIRATIONS UNLABORED / AIRWAY INTACT.

## 2013-04-20 NOTE — ED Notes (Signed)
Pt discharged.Vital signs stable and GCS 15 

## 2013-04-21 NOTE — ED Provider Notes (Signed)
Medical screening examination/treatment/procedure(s) were performed by non-physician practitioner and as supervising physician I was immediately available for consultation/collaboration.  Doug Sou, MD 04/21/13 1610

## 2013-11-02 ENCOUNTER — Encounter (HOSPITAL_COMMUNITY): Payer: Self-pay | Admitting: Emergency Medicine

## 2013-11-02 ENCOUNTER — Emergency Department (HOSPITAL_COMMUNITY): Payer: Medicaid Other

## 2013-11-02 ENCOUNTER — Emergency Department (HOSPITAL_COMMUNITY)
Admission: EM | Admit: 2013-11-02 | Discharge: 2013-11-03 | Disposition: A | Discharge status: Home / Self Care | Payer: Medicaid Other | Attending: Emergency Medicine | Admitting: Emergency Medicine

## 2013-11-02 ENCOUNTER — Emergency Department (INDEPENDENT_AMBULATORY_CARE_PROVIDER_SITE_OTHER)
Admission: EM | Admit: 2013-11-02 | Discharge: 2013-11-02 | Disposition: A | Discharge status: Home / Self Care | Payer: Self-pay | Source: Home / Self Care | Attending: Family Medicine | Admitting: Family Medicine

## 2013-11-02 DIAGNOSIS — A5901 Trichomonal vulvovaginitis: Secondary | ICD-10-CM | POA: Insufficient documentation

## 2013-11-02 DIAGNOSIS — R102 Pelvic and perineal pain: Secondary | ICD-10-CM

## 2013-11-02 DIAGNOSIS — Z87891 Personal history of nicotine dependence: Secondary | ICD-10-CM | POA: Insufficient documentation

## 2013-11-02 DIAGNOSIS — R109 Unspecified abdominal pain: Secondary | ICD-10-CM

## 2013-11-02 DIAGNOSIS — Z880 Allergy status to penicillin: Secondary | ICD-10-CM | POA: Insufficient documentation

## 2013-11-02 DIAGNOSIS — A599 Trichomoniasis, unspecified: Secondary | ICD-10-CM

## 2013-11-02 DIAGNOSIS — N949 Unspecified condition associated with female genital organs and menstrual cycle: Secondary | ICD-10-CM | POA: Insufficient documentation

## 2013-11-02 LAB — CBC WITH DIFFERENTIAL/PLATELET
Basophils Relative: 0 % (ref 0–1)
HCT: 35.6 % — ABNORMAL LOW (ref 36.0–46.0)
Hemoglobin: 11.9 g/dL — ABNORMAL LOW (ref 12.0–15.0)
Lymphs Abs: 1.7 10*3/uL (ref 0.7–4.0)
MCH: 27.5 pg (ref 26.0–34.0)
MCHC: 33.4 g/dL (ref 30.0–36.0)
Monocytes Absolute: 1.4 10*3/uL — ABNORMAL HIGH (ref 0.1–1.0)
Monocytes Relative: 13 % — ABNORMAL HIGH (ref 3–12)
Neutro Abs: 7.9 10*3/uL — ABNORMAL HIGH (ref 1.7–7.7)
Neutrophils Relative %: 71 % (ref 43–77)
RBC: 4.32 MIL/uL (ref 3.87–5.11)

## 2013-11-02 LAB — WET PREP, GENITAL
WBC, Wet Prep HPF POC: NONE SEEN
Yeast Wet Prep HPF POC: NONE SEEN

## 2013-11-02 LAB — BASIC METABOLIC PANEL
BUN: 9 mg/dL (ref 6–23)
Chloride: 102 mEq/L (ref 96–112)
Creatinine, Ser: 0.76 mg/dL (ref 0.50–1.10)
Glucose, Bld: 96 mg/dL (ref 70–99)
Potassium: 3.4 mEq/L — ABNORMAL LOW (ref 3.5–5.1)

## 2013-11-02 LAB — POCT PREGNANCY, URINE: Preg Test, Ur: NEGATIVE

## 2013-11-02 LAB — URINALYSIS, ROUTINE W REFLEX MICROSCOPIC
Glucose, UA: NEGATIVE mg/dL
Ketones, ur: 15 mg/dL — AB
Specific Gravity, Urine: 1.026 (ref 1.005–1.030)
pH: 6 (ref 5.0–8.0)

## 2013-11-02 LAB — URINE MICROSCOPIC-ADD ON

## 2013-11-02 MED ORDER — IOHEXOL 300 MG/ML  SOLN
25.0000 mL | INTRAMUSCULAR | Status: AC
  Administered 2013-11-02: 25 mL via ORAL

## 2013-11-02 MED ORDER — HYDROMORPHONE HCL PF 1 MG/ML IJ SOLN
1.0000 mg | Freq: Once | INTRAMUSCULAR | Status: AC
  Administered 2013-11-02: 1 mg via INTRAVENOUS
  Filled 2013-11-02: qty 1

## 2013-11-02 MED ORDER — SODIUM CHLORIDE 0.9 % IV BOLUS (SEPSIS)
1000.0000 mL | Freq: Once | INTRAVENOUS | Status: AC
  Administered 2013-11-02: 1000 mL via INTRAVENOUS

## 2013-11-02 MED ORDER — IOHEXOL 300 MG/ML  SOLN
100.0000 mL | Freq: Once | INTRAMUSCULAR | Status: AC | PRN
  Administered 2013-11-02: 100 mL via INTRAVENOUS

## 2013-11-02 MED ORDER — ONDANSETRON HCL 4 MG/2ML IJ SOLN
4.0000 mg | Freq: Once | INTRAMUSCULAR | Status: AC
  Administered 2013-11-02: 4 mg via INTRAVENOUS
  Filled 2013-11-02: qty 2

## 2013-11-02 NOTE — ED Provider Notes (Signed)
CSN: [id number]     Arrival date & time 11/02/13  1954 History   First MD Initiated Contact with Patient 11/02/13 2036     Chief Complaint  Patient presents with  . Abdominal Pain   (Consider location/radiation/quality/duration/timing/severity/associated sxs/prior Treatment) Patient is a 22 y.o. female presenting with abdominal pain.  Abdominal Pain  Pt reports onset of severe sharp/cramping bilateral lower abdominal pain 4 days ago, initially improved some but then worsened 2 days ago after her daughter jumped on her stomach. Pain worse with movement, riding in car. Associated with nausea, but no vomiting or fever. She reports some mild vaginal spotting since 2 days ago, LMP was end of last month. No dysuria or hematuria, no history of similar. Seen at [facility], preg neg, sent to the ED for further evaluation.   Past Medical History  Diagnosis Date  . No pertinent past medical history    Past Surgical History  Procedure Laterality Date  . No past surgeries     Family History  Problem Relation Age of Onset  . Cancer Maternal Aunt     uterine  . Diabetes Maternal Aunt   . Hypertension Maternal Aunt   . Cancer Maternal Grandmother     breastt  . Cancer Cousin     leaukemia   History  Substance Use Topics  . Smoking status: Former Smoker    Quit date: 08/29/2011  . Smokeless tobacco: Never Used  . Alcohol Use: No   OB History   Grav Para Term Preterm Abortions TAB SAB Ect Mult Living   3 2 2  0 1 1 0 0 0 2     Review of Systems  Gastrointestinal: Positive for abdominal pain.   All other systems reviewed and are negative except as noted in HPI.   Allergies  Penicillins  Home Medications   Current Outpatient Rx  Name  Route  Sig  Dispense  Refill  . ibuprofen (ADVIL,MOTRIN) 200 MG tablet   Oral   Take 600 mg by mouth every 6 (six) hours as needed for fever, headache, mild pain, moderate pain or cramping.          BP 90/45  Pulse 88  Temp(Src) 98.5 F (36.9 C)  (Oral)  Resp 22  SpO2 99%  LMP 10/17/2013  Breastfeeding? No Physical Exam  Nursing note and vitals reviewed. Constitutional: She is oriented to person, place, and time. She appears well-developed and well-nourished.  HENT:  Head: Normocephalic and atraumatic.  Eyes: EOM are normal. Pupils are equal, round, and reactive to light.  Neck: Normal range of motion. Neck supple.  Cardiovascular: Normal rate, normal heart sounds and intact distal pulses.   Pulmonary/Chest: Effort normal and breath sounds normal.  Abdominal: Bowel sounds are normal. She exhibits no distension. There is tenderness (bilateral lower abdominal tenderness, worse on the right). There is guarding. There is no rebound.  Genitourinary:  Vaginal bleeding obscures cervix, no discharge, diffuse tenderness, ?CMT, no adnexal masses, bilateral adnexal tenderness  Musculoskeletal: Normal range of motion. She exhibits no edema and no tenderness.  Neurological: She is alert and oriented to person, place, and time. She has normal strength. No cranial nerve deficit or sensory deficit.  Skin: Skin is warm and dry. No rash noted.  Psychiatric: She has a normal mood and affect.    ED Course  Procedures (including critical care time) Labs Review Labs Reviewed  CBC WITH DIFFERENTIAL - Abnormal; Notable for the following:    WBC 11.2 (*)  Hemoglobin 11.9 (*)    HCT 35.6 (*)    Neutro Abs 7.9 (*)    Monocytes Relative 13 (*)    Monocytes Absolute 1.4 (*)    All other components within normal limits  BASIC METABOLIC PANEL - Abnormal; Notable for the following:    Potassium 3.4 (*)    All other components within normal limits  URINALYSIS, ROUTINE W REFLEX MICROSCOPIC - Abnormal; Notable for the following:    APPearance CLOUDY (*)    Hgb urine dipstick LARGE (*)    Bilirubin Urine SMALL (*)    Ketones, ur 15 (*)    Protein, ur 30 (*)    Urobilinogen, UA 2.0 (*)    Leukocytes, UA SMALL (*)    All other components within  normal limits  URINE MICROSCOPIC-ADD ON - Abnormal; Notable for the following:    Squamous Epithelial / LPF FEW (*)    All other components within normal limits  GC/CHLAMYDIA PROBE AMP  WET PREP, GENITAL  URINE CULTURE   Imaging Review No results found.  EKG Interpretation   None       MDM  No diagnosis found.  Pelvic pain for 4 days, bilateral doubt this is ovarian torsion but could be appendicitis as pain is more pronounced on the R. Suspect PID if CT is neg. Pain meds. Care signed out at the change of shift.     Charles B. Bernette Mayers, MD 11/02/13 [phone]

## 2013-11-02 NOTE — ED Notes (Addendum)
Presents with lower abdominal pain described as constant and described as "severe pain" radiation down right leg and into lower back. Pain began 4-5 hours ago associated with spotting from vagina. Pain began after daughter jumped on her and she felt a  "pop" and " spotting blood started coming out of my vagina" pt is tearful and unable to sit still.  Denies nausea.  Urine preg at [facility] negative

## 2013-11-02 NOTE — ED Notes (Signed)
Pt c/o pain in abdomen and back, was at urgent care,

## 2013-11-02 NOTE — ED Provider Notes (Signed)
Krystal Murray is a 22 y.o. female who presents to Urgent Care today for severe lower abdominal pain. Patient has had 4-5 days of moderate to worsening abdominal pain. She notes the pain became particularly intense today. She states, "I feel like something popped in my stomach". She denies any injury nausea vomiting or diarrhea. She notes that she is currently having a menstrual cycle which started yesterday. Her last menstrual cycle before this was at the end of October. She's tried over-the-counter pain medications which has not helped. The pain radiates to her low back. She notes particular pain with walking and with riding in the car. She states this is the worse pain she has ever experienced and is similar to labor.    Past Medical History  Diagnosis Date  . No pertinent past medical history    History  Substance Use Topics  . Smoking status: Former Smoker    Quit date: 08/29/2011  . Smokeless tobacco: Never Used  . Alcohol Use: No   ROS as above Medications reviewed. No current facility-administered medications for this encounter.   Current Outpatient Prescriptions  Medication Sig Dispense Refill  . clindamycin (CLEOCIN) 150 MG capsule Take 1 capsule (150 mg total) by mouth every 6 (six) hours.  28 capsule  0  . OVER THE COUNTER MEDICATION OTC fever reducer        Exam:  BP 118/79  Pulse 114  Temp(Src) 99 F (37.2 C) (Oral)  SpO2 99%  LMP 11/01/2013 Gen: Crying and in pain appearing HEENT: EOMI,  MMM Lungs: CTABL Nl WOB Heart: RRR no MRG Abd: Hypoactive bowel sounds. Nondistended appearing. Tender to palpation bilateral lower abdomen. Patient displays voluntary and involuntary guarding, and rebound tenderness. She is quite tender over the right lower quadrant.  Exts: Non edematous BL  LE, warm and well perfused.   Results for orders placed during the hospital encounter of 11/02/13 (from the past 24 hour(s))  POCT PREGNANCY, URINE     Status: None   Collection Time   11/02/13  7:39 PM      Result Value Range   Preg Test, Ur NEGATIVE  NEGATIVE   No results found.  Assessment and Plan: 22 y.o. female with severe abdominal pain.  This is obviously concerning for appendicitis or ovarian torsion or pelvic inflammatory disease.  I feel at this point patient will likely need advanced imaging and do not want to delay her diagnosis by initiated a workup at [facility] urgent care. We'll transfer him immediately to the [facility] emergency room for further evaluation and management.      Rodolph Bong, MD 11/02/13 [phone]

## 2013-11-02 NOTE — ED Notes (Signed)
C/o abd pain which started four five days ago. Patient states it feels as something popped in her stomach. States the pain does radiate to her back Pain medication has been taking but no relief.   Never had this pain before.

## 2013-11-03 LAB — URINE CULTURE

## 2013-11-03 MED ORDER — OXYCODONE-ACETAMINOPHEN 5-325 MG PO TABS
1.0000 | ORAL_TABLET | Freq: Once | ORAL | Status: AC
  Administered 2013-11-03: 1 via ORAL
  Filled 2013-11-03: qty 1

## 2013-11-03 MED ORDER — TRAMADOL HCL 50 MG PO TABS: 50.0000 mg | ORAL_TABLET | Freq: Four times a day (QID) | ORAL | Status: DC | PRN

## 2013-11-03 MED ORDER — AZITHROMYCIN 250 MG PO TABS
1000.0000 mg | ORAL_TABLET | Freq: Once | ORAL | Status: AC
  Administered 2013-11-03: 1000 mg via ORAL
  Filled 2013-11-03: qty 4

## 2013-11-03 MED ORDER — LIDOCAINE HCL (PF) 1 % IJ SOLN
INTRAMUSCULAR | Status: AC
  Administered 2013-11-03: 5 mL
  Filled 2013-11-03: qty 5

## 2013-11-03 MED ORDER — CEFTRIAXONE SODIUM 250 MG IJ SOLR
250.0000 mg | Freq: Once | INTRAMUSCULAR | Status: AC
  Administered 2013-11-03: 250 mg via INTRAMUSCULAR
  Filled 2013-11-03: qty 250

## 2013-11-03 MED ORDER — DOXYCYCLINE HYCLATE 100 MG PO CAPS: 100.0000 mg | ORAL_CAPSULE | Freq: Two times a day (BID) | ORAL | Status: DC

## 2013-11-03 MED ORDER — METRONIDAZOLE 500 MG PO TABS
2000.0000 mg | ORAL_TABLET | Freq: Once | ORAL | Status: AC
  Administered 2013-11-03: 2000 mg via ORAL
  Filled 2013-11-03: qty 4

## 2013-11-03 NOTE — ED Provider Notes (Signed)
Pt received in sign out from Dr. Bernette Mayers.  Pt with lower abdominal pain x 4 days, worsened past 2 days after daughter jumped on abdomen.  Has also had some vaginal spotting.  Labs and pelvic obtained-- u/a with some trich, suspected PID.  Plan:  CT pending to r/o appendicitis.  If negative, tx for PID and d/c home.  Results for orders placed during the hospital encounter of 11/02/13  WET PREP, GENITAL      Result Value Range   Yeast Wet Prep HPF POC NONE SEEN  NONE SEEN   Trich, Wet Prep FEW (*) NONE SEEN   Clue Cells Wet Prep HPF POC FEW (*) NONE SEEN   WBC, Wet Prep HPF POC NONE SEEN  NONE SEEN  CBC WITH DIFFERENTIAL      Result Value Range   WBC 11.2 (*) 4.0 - 10.5 K/uL   RBC 4.32  3.87 - 5.11 MIL/uL   Hemoglobin 11.9 (*) 12.0 - 15.0 g/dL   HCT 47.8 (*) 29.5 - 62.1 %   MCV 82.4  78.0 - 100.0 fL   MCH 27.5  26.0 - 34.0 pg   MCHC 33.4  30.0 - 36.0 g/dL   RDW 30.8  65.7 - 84.6 %   Platelets 218  150 - 400 K/uL   Neutrophils Relative % 71  43 - 77 %   Neutro Abs 7.9 (*) 1.7 - 7.7 K/uL   Lymphocytes Relative 15  12 - 46 %   Lymphs Abs 1.7  0.7 - 4.0 K/uL   Monocytes Relative 13 (*) 3 - 12 %   Monocytes Absolute 1.4 (*) 0.1 - 1.0 K/uL   Eosinophils Relative 1  0 - 5 %   Eosinophils Absolute 0.1  0.0 - 0.7 K/uL   Basophils Relative 0  0 - 1 %   Basophils Absolute 0.0  0.0 - 0.1 K/uL  BASIC METABOLIC PANEL      Result Value Range   Sodium 138  135 - 145 mEq/L   Potassium 3.4 (*) 3.5 - 5.1 mEq/L   Chloride 102  96 - 112 mEq/L   CO2 24  19 - 32 mEq/L   Glucose, Bld 96  70 - 99 mg/dL   BUN 9  6 - 23 mg/dL   Creatinine, Ser 9.62  0.50 - 1.10 mg/dL   Calcium 9.5  8.4 - 95.2 mg/dL   GFR calc non Af Amer >90  >90 mL/min   GFR calc Af Amer >90  >90 mL/min  URINALYSIS, ROUTINE W REFLEX MICROSCOPIC      Result Value Range   Color, Urine YELLOW  YELLOW   APPearance CLOUDY (*) CLEAR   Specific Gravity, Urine 1.026  1.005 - 1.030   pH 6.0  5.0 - 8.0   Glucose, UA NEGATIVE  NEGATIVE  mg/dL   Hgb urine dipstick LARGE (*) NEGATIVE   Bilirubin Urine SMALL (*) NEGATIVE   Ketones, ur 15 (*) NEGATIVE mg/dL   Protein, ur 30 (*) NEGATIVE mg/dL   Urobilinogen, UA 2.0 (*) 0.0 - 1.0 mg/dL   Nitrite NEGATIVE  NEGATIVE   Leukocytes, UA SMALL (*) NEGATIVE  URINE MICROSCOPIC-ADD ON      Result Value Range   Squamous Epithelial / LPF FEW (*) RARE   WBC, UA 3-6  <3 WBC/hpf   RBC / HPF 3-6  <3 RBC/hpf   Bacteria, UA RARE  RARE   Ct Abdomen Pelvis W Contrast  11/03/2013   CLINICAL DATA:  Right lower quadrant  abdomen pain and low back pain. Clinical concern for appendicitis.  EXAM: CT ABDOMEN AND PELVIS WITH CONTRAST  TECHNIQUE: Multidetector CT imaging of the abdomen and pelvis was performed using the standard protocol following bolus administration of intravenous contrast.  CONTRAST:  OMNIPAQUE IOHEXOL 300 MG/ML  SOLN  COMPARISON:  None.  FINDINGS: Normal appearing liver, spleen, pancreas, gallbladder, adrenal glands, kidneys, urinary bladder, uterus and right ovary. Mild soft tissue stranding and fluid at the superior aspect of the left ovary. The left ovary is minimally larger than the right ovary. No gastrointestinal abnormalities or enlarged lymph nodes. Normal appearing appendix. Clear lung bases. Normal appearing bones.  IMPRESSION: 1. The left ovary appears minimally larger than the right ovary and there is a small amount of edema at the superior aspect of the left ovary. This could be due to recent cyst rupture. Ovarian torsion and mild pelvic inflammatory disease are less likely considerations, especially since the patient's symptoms are on the right. 2. Normal appearing right ovary and appendix.   Electronically Signed   By: Gordan Payment M.D.   On: 11/03/2013 00:18    CT negative for acute findings, specifically normal appearance of appendix.  Pt tx with azithromycin, rocephin, and flagyl in the ED.  Will start on doxycycline and tramadol for pain. [facility]U [facility] if  no improvement in the next few days.  Discussed plan with pt, she agreed.  Return precautions advised.  Garlon Hatchet, PA-C 11/03/13 [phone]

## 2013-11-03 NOTE — Progress Notes (Signed)
   CARE MANAGEMENT ED NOTE 11/03/2013  Patient:  Krystal Murray, Krystal Murray   Account Number:  [id number]  Date Initiated:  11/03/2013  Documentation initiated by:  Edd Arbour  Subjective/Objective Assessment:   22 yr old female self pay pt seen at [facility] on 11/02/13 to 13/14/14 d/c with ultram and doxycycline Pt has f/u appt with [facility] on 11/27/13 at 3:15 pm Self pay [facility] resident     Subjective/Objective Assessment Detail:   Reports informed by EDP to go to [employer] for free antibotic promotion.  Pt's contact # [phone]     Action/Plan:   CM received voice message from pt, reviewed epic notes d/c instructions Spoke with pt's aunt, Lamar Laundry Discussed differences in [facility] & [employer] (no longer has free antibiotics promotion) Discussed $4 med list Referred to [facility]   Action/Plan Detail:   Encouraged to call CM back if continued with issues with getting meds Left CM office number Cm called pt back for f/u at 1800 she has obtained medicines but had emesis Cm discussed OTC nausea med & take the d/c meds only after she has eaten   Anticipated DC Date:  11/02/2013     Status Recommendation to Physician:   Result of Recommendation:    Other ED Services  Consult Working Plan    DC Planning Services  PCP issues  Other  Outpatient Services - Pt will follow up  Medication Assistance    Choice offered to / List presented to:            Status of service:  Completed, signed off  ED Comments:   ED Comments Detail:  Answered all pt's questions about her meds Encouraged to seek assist at urgent care or emergency room if issues persists Encouraged pcp services also Pt voiced understanding and appreciation of information/resources discussed

## 2013-11-04 LAB — GC/CHLAMYDIA PROBE AMP: GC Probe RNA: POSITIVE — AB

## 2013-11-05 ENCOUNTER — Telehealth (HOSPITAL_COMMUNITY): Payer: Self-pay | Admitting: Emergency Medicine

## 2013-11-05 NOTE — ED Notes (Signed)
+  Chlamydia. +Gonnorrhea. Patient treated with Rocephin and Zithromax. [employer] faxed.

## 2013-11-05 NOTE — ED Notes (Signed)
Patient has +Chlamydia & +Gonorrhea. °

## 2013-11-07 NOTE — ED Provider Notes (Signed)
Medical screening examination/treatment/procedure(s) were performed by non-physician practitioner and as supervising physician I was immediately available for consultation/collaboration.  EKG Interpretation   None         Charles B. Sheldon, MD 11/07/13 0700 

## 2013-11-07 NOTE — ED Notes (Signed)
Letter sent to [employer] address 

## 2013-11-27 ENCOUNTER — Encounter: Payer: Self-pay | Admitting: Obstetrics and Gynecology

## 2013-12-07 IMAGING — US US OB DETAIL+14 WK
1 series · 12 of 28 positions shown · non-contrast
Comparison: none

[Series 1: us ob detail +14 wk · 86 acquisitions, 12 frames shown]
[im 4/86]
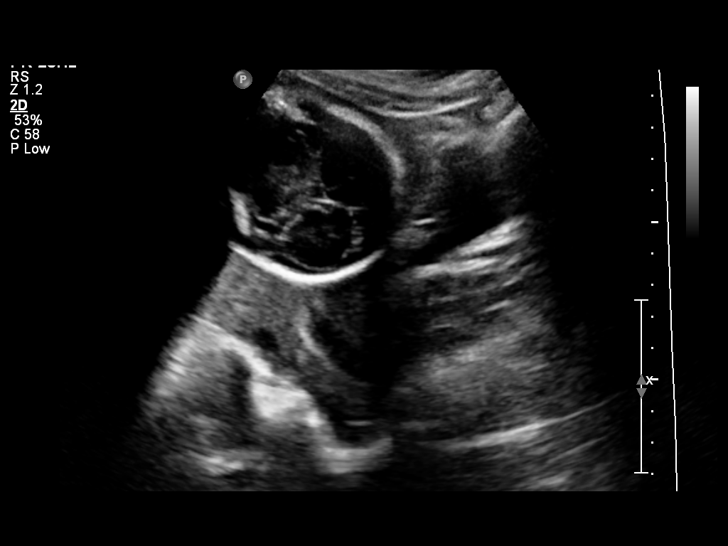
[im 10/86]
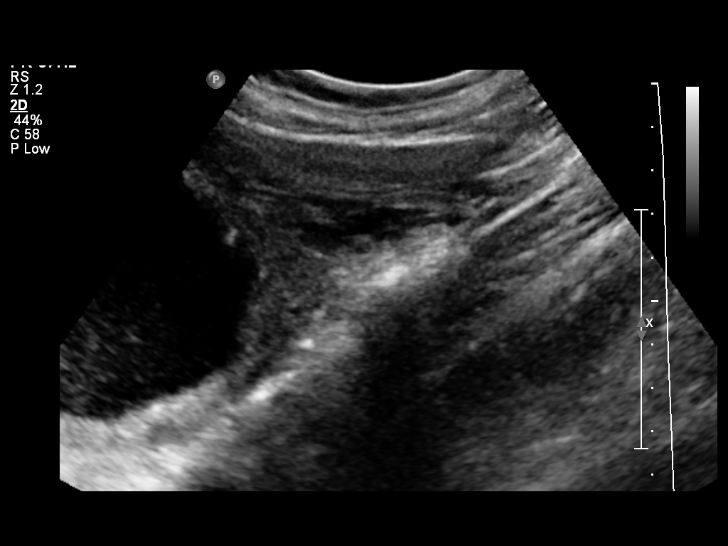
[im 16/86]
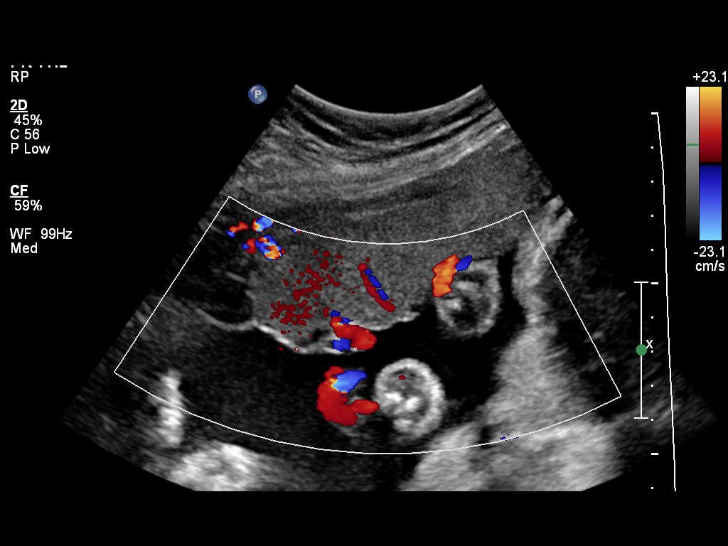
[im 26/86]
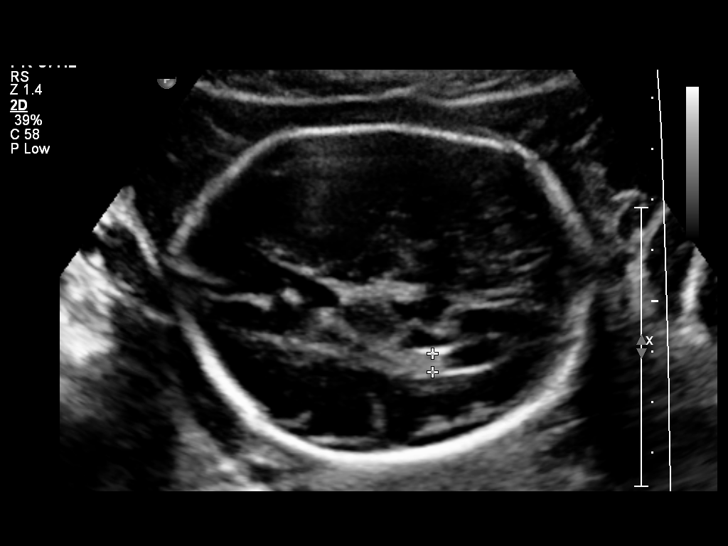
[im 32/86]
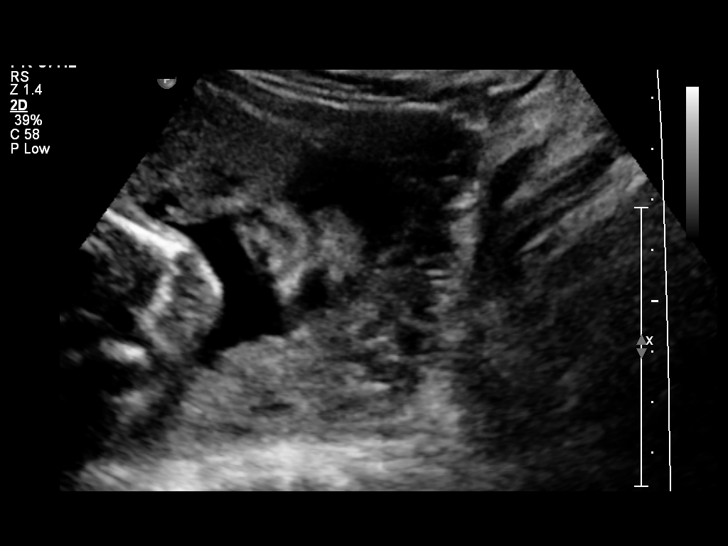
[im 38/86]
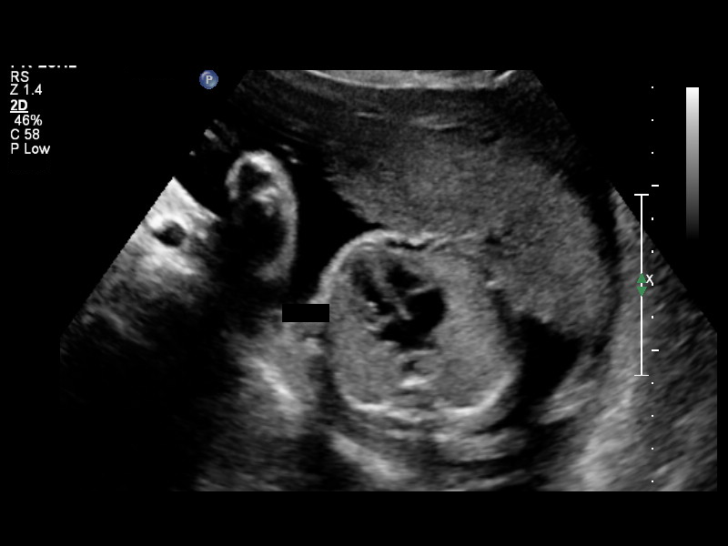
[im 48/86]
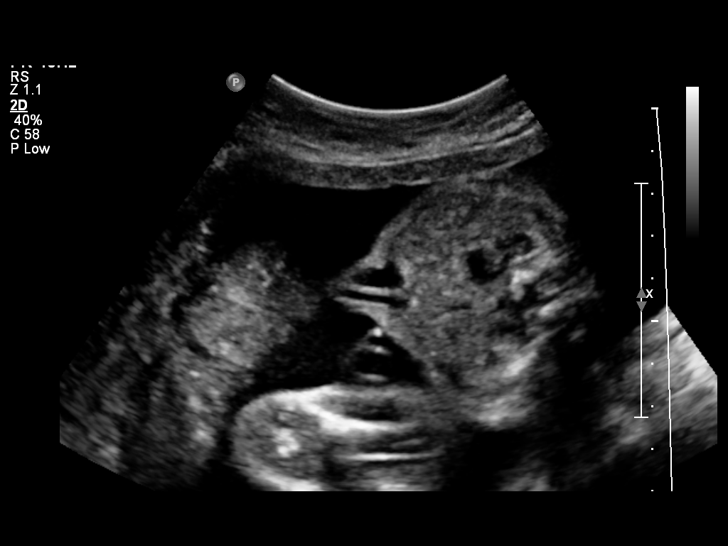
[im 54/86]
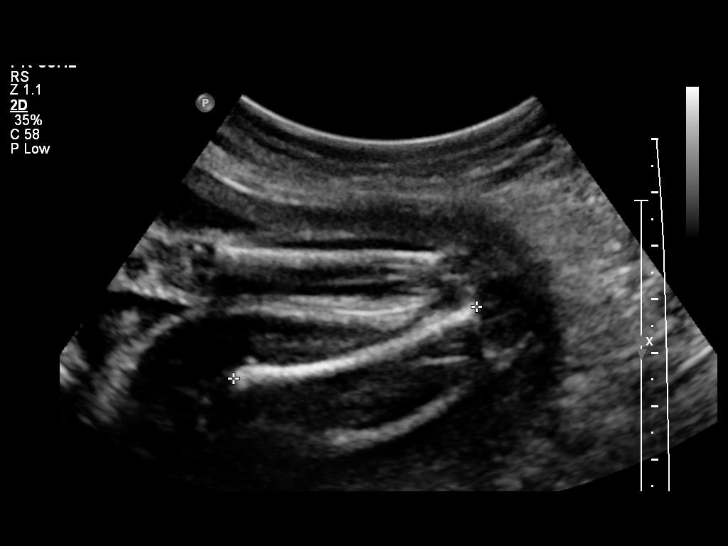
[im 60/86]
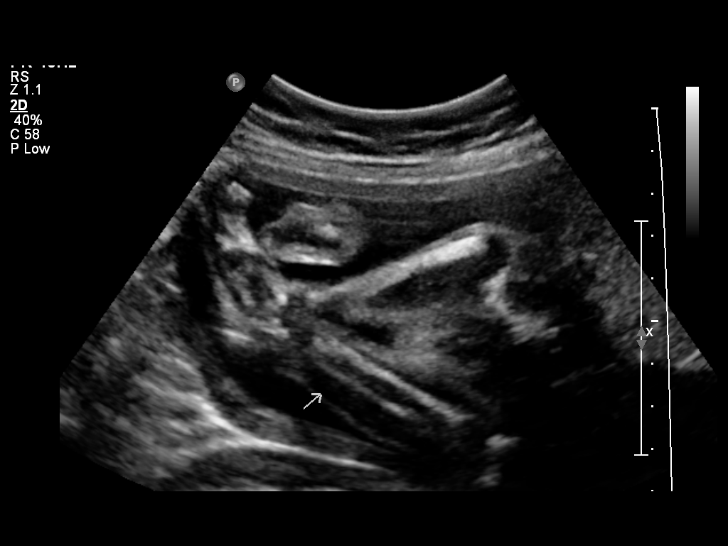
[im 70/86]
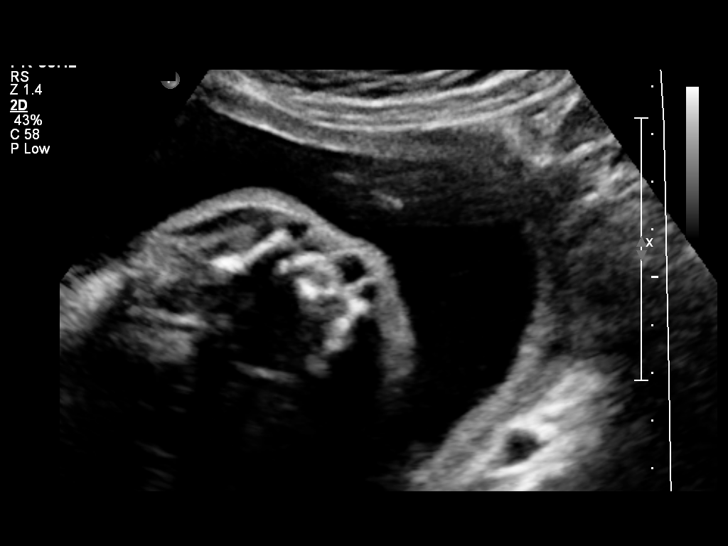
[im 76/86]
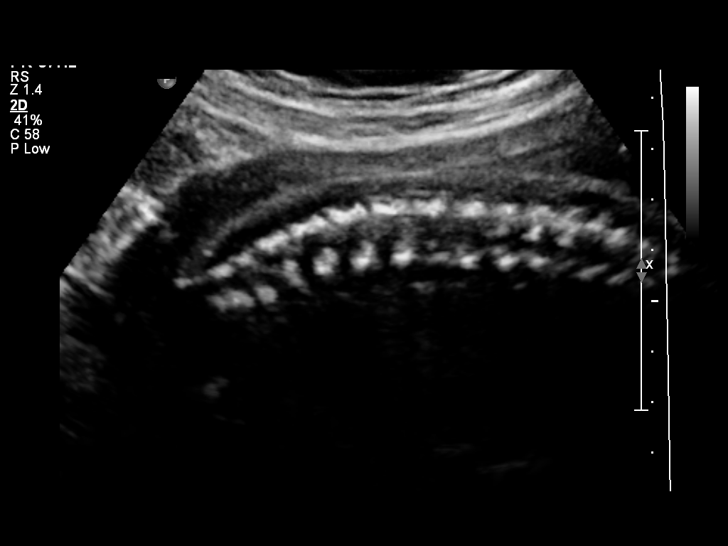
[im 82/86]
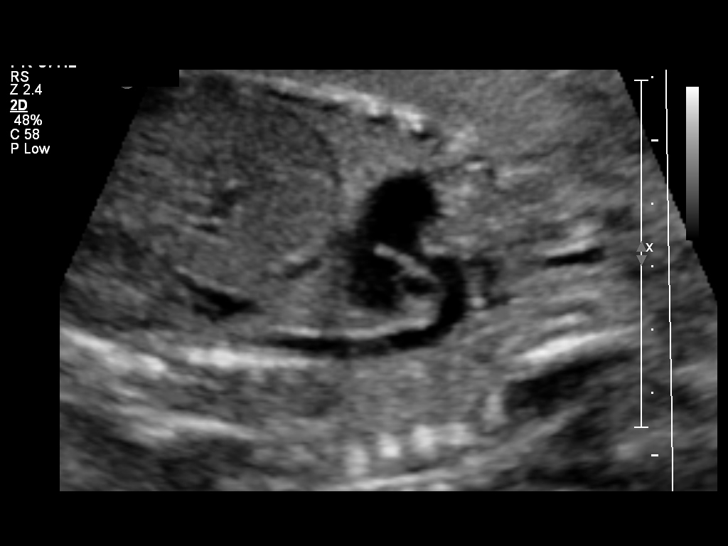

[12 of 28 positions shown; findings below may reference images not displayed]

OBSTETRICS REPORT
                      (Signed Final 06/08/2012 [DATE])

 Order#:         [id number]
Procedures

 US OB DETAIL + 14 WK                                  76811.0
Indications

 Detailed fetal anatomic survey
 No or Little Prenatal Care
Fetal Evaluation

 Fetal Heart Rate:  143                         bpm
 Cardiac Activity:  Observed
 Presentation:      Cephalic
 Placenta:          Anterior, above cervical os
 P. Cord            Visualized
 Insertion:

 Amniotic Fluid
 AFI FV:      Subjectively within normal limits
                                             Larg Pckt:     5.0  cm
Biometry

 BPD:     63.3  mm    G. Age:   25w 4d                CI:        69.29   70 - 86
                                                      FL/HC:      19.9   18.6 -

 HC:     242.8  mm    G. Age:   26w 3d       33  %    HC/AC:      1.12   1.04 -

 AC:     217.4  mm    G. Age:   26w 2d       44  %    FL/BPD:     76.5   71 - 87
 FL:      48.4  mm    G. Age:   26w 2d       39  %    FL/AC:      22.3   20 - 24
 HUM:     44.9  mm    G. Age:   26w 4d       57  %
 CER:     31.2  mm    G. Age:   27w 1d       72  %

 Est. FW:     905  gm          2 lb      55  %
Gestational Age

 LMP:           28w 5d       Date:   11/20/11                 EDD:   08/26/12
 U/S Today:     26w 1d                                        EDD:   09/13/12
 Best:          26w 1d    Det. By:   U/S (06/08/12)           EDD:   09/13/12
Anatomy

 Cranium:           Appears normal      Aortic Arch:       Appears normal
 Fetal Cavum:       Appears normal      Ductal Arch:       Appears normal
 Ventricles:        Appears normal      Diaphragm:         Appears normal
 Choroid Plexus:    Appears normal      Stomach:           Appears normal
 Cerebellum:        Appears normal      Abdomen:           Appears normal
 Posterior Fossa:   Appears normal      Abdominal Wall:    Appears nml
                                                           (cord insert,
                                                           abd wall)
 Nuchal Fold:       Not applicable      Cord Vessels:      Appears normal
                    (>20 wks GA)                           (3 vessel cord)
 Face:              Appears normal      Kidneys:           Appear normal
                    (lips/profile/orbit
                    s)
 Heart:             Appears normal      Bladder:           Appears normal
                    (4 chamber &
                    axis)
 RVOT:              Appears normal      Spine:             Appears normal
 LVOT:              Appears normal      Limbs:             Four extremities
                                                           seen

 Other:     Heels and 5th digit appears normal. Fetus appears to be
            a female. Nasal bone visualized.
Cervix Uterus Adnexa

 Cervical Length:   3.66      cm

 Cervix:       Normal appearance by transabdominal scan.
 Uterus:       No abnormality visualized.
 Cul De Sac:   No free fluid seen.

 Left Ovary:   Not visualized.
 Right Ovary:  Size(cm) L: 3.53 x W: 1.66 x H: 2.1  Volume(cc):
               Within normal limits.

 Adnexa:     No abnormality visualized.
Impression

 Single live IUP in cephalic presentation.  Measuring 2 weeks
 behind dating by LMP, which may suggest inaccuracy of the
 LMP. Follow up to assess appropriate growth is
 recommended.
 No anatomic abnormality seen with a good quality survey
 possible.

## 2014-01-10 ENCOUNTER — Encounter (HOSPITAL_COMMUNITY): Payer: Self-pay | Admitting: Emergency Medicine

## 2014-01-10 ENCOUNTER — Emergency Department (HOSPITAL_COMMUNITY)
Admission: EM | Admit: 2014-01-10 | Discharge: 2014-01-10 | Disposition: A | Discharge status: Home / Self Care | Payer: Medicaid Other | Attending: Emergency Medicine | Admitting: Emergency Medicine

## 2014-01-10 DIAGNOSIS — L258 Unspecified contact dermatitis due to other agents: Secondary | ICD-10-CM | POA: Insufficient documentation

## 2014-01-10 DIAGNOSIS — N730 Acute parametritis and pelvic cellulitis: Secondary | ICD-10-CM

## 2014-01-10 DIAGNOSIS — N739 Female pelvic inflammatory disease, unspecified: Secondary | ICD-10-CM | POA: Insufficient documentation

## 2014-01-10 DIAGNOSIS — Z87891 Personal history of nicotine dependence: Secondary | ICD-10-CM | POA: Insufficient documentation

## 2014-01-10 DIAGNOSIS — A5901 Trichomonal vulvovaginitis: Secondary | ICD-10-CM | POA: Insufficient documentation

## 2014-01-10 DIAGNOSIS — L309 Dermatitis, unspecified: Secondary | ICD-10-CM

## 2014-01-10 DIAGNOSIS — Z880 Allergy status to penicillin: Secondary | ICD-10-CM | POA: Insufficient documentation

## 2014-01-10 DIAGNOSIS — Z3202 Encounter for pregnancy test, result negative: Secondary | ICD-10-CM | POA: Insufficient documentation

## 2014-01-10 DIAGNOSIS — A599 Trichomoniasis, unspecified: Secondary | ICD-10-CM

## 2014-01-10 LAB — URINALYSIS, ROUTINE W REFLEX MICROSCOPIC
Bilirubin Urine: NEGATIVE
Glucose, UA: NEGATIVE mg/dL
Ketones, ur: NEGATIVE mg/dL
NITRITE: NEGATIVE
PH: 6 (ref 5.0–8.0)
Protein, ur: NEGATIVE mg/dL
Specific Gravity, Urine: 1.028 (ref 1.005–1.030)
UROBILINOGEN UA: 0.2 mg/dL (ref 0.0–1.0)

## 2014-01-10 LAB — POCT PREGNANCY, URINE: Preg Test, Ur: NEGATIVE

## 2014-01-10 LAB — WET PREP, GENITAL
CLUE CELLS WET PREP: NONE SEEN
Yeast Wet Prep HPF POC: NONE SEEN

## 2014-01-10 LAB — URINE MICROSCOPIC-ADD ON

## 2014-01-10 MED ORDER — METRONIDAZOLE 500 MG PO TABS
2000.0000 mg | ORAL_TABLET | Freq: Once | ORAL | Status: AC
  Administered 2014-01-10: 2000 mg via ORAL
  Filled 2014-01-10: qty 4

## 2014-01-10 MED ORDER — AZITHROMYCIN 1 G PO PACK
1.0000 g | PACK | Freq: Once | ORAL | Status: AC
  Administered 2014-01-10: 1 g via ORAL
  Filled 2014-01-10: qty 1

## 2014-01-10 MED ORDER — LIDOCAINE HCL (PF) 1 % IJ SOLN
INTRAMUSCULAR | Status: AC
  Administered 2014-01-10: 5 mL
  Filled 2014-01-10: qty 5

## 2014-01-10 MED ORDER — HYDROCORTISONE 1 % EX CREA
TOPICAL_CREAM | Freq: Four times a day (QID) | CUTANEOUS | Status: DC
  Administered 2014-01-10 (×2): via TOPICAL
  Filled 2014-01-10: qty 28

## 2014-01-10 MED ORDER — CEFTRIAXONE SODIUM 250 MG IJ SOLR
250.0000 mg | Freq: Once | INTRAMUSCULAR | Status: AC
  Administered 2014-01-10: 250 mg via INTRAMUSCULAR
  Filled 2014-01-10: qty 250

## 2014-01-10 NOTE — ED Notes (Signed)
Pt c/o bug bites, (face) (arm) (buttocks) (legs) (toes), pt states that they equally hurt and itch,

## 2014-01-10 NOTE — Progress Notes (Signed)
Met Patient at bedside.Role of Case Manager explained.Patient reports today's ED visit is secondary to a body rash.Patient provided with a resource sheet for the [facility] and wellness clinic.Patient will call to set up her PCP appointment.Patient educated once established with the clinic she can get her urgent prescriptions  Filled for free at the clinic.No further CM needs identified today.

## 2014-01-10 NOTE — ED Notes (Signed)
Pt. reports itchy insect bites at left hand and face , headache and dizziness .

## 2014-01-10 NOTE — ED Provider Notes (Signed)
CSN: [id number]     Arrival date & time 01/10/14  0600 History   First MD Initiated Contact with Patient 01/10/14 [phone]     Chief Complaint  Patient presents with  . Insect Bite   (Consider location/radiation/quality/duration/timing/severity/associated sxs/prior Treatment) HPI Comments: Patient is otherwise healthy 23 year old female who presents to the ED with intially a complaint of itchy rash to bilateral arms and legs.  She reports that she thinks she has been bit by an insect.  She reports no other family members with same rash.  She also notes during this visit that she has been having lower abdominal pain similar to the last time she had PID.  She states that she was placed on antibiotics but could not afford them so she did not take them.  She denies fever, chills, nausea, vomiting, but does report vaginal discharge but no bleeding.  Patient is a 23 y.o. female presenting with rash and abdominal pain. The history is provided by the patient. No language interpreter was used.  Rash Location:  Shoulder/arm and leg Shoulder/arm rash location:  L arm and R arm Leg rash location:  L leg and R leg Quality: itchiness and redness   Quality: not painful   Severity:  Mild Onset quality:  Gradual Duration:  1 day Timing:  Constant Progression:  Worsening Chronicity:  New Context: insect bite/sting   Context: not animal contact, not eggs, not exposure to similar rash, not new detergent/soap, not plant contact, not pollen, not pregnancy and not sun exposure   Relieved by:  Nothing Worsened by:  Nothing tried Ineffective treatments:  None tried Associated symptoms: abdominal pain   Associated symptoms: no diarrhea, no fever, no headaches, no hoarse voice, no induration, no joint pain, no myalgias, no nausea, no sore throat, no throat swelling, no tongue swelling, no URI and not vomiting   Abdominal Pain Pain radiates to:  Suprapubic region Pain severity:  Moderate Onset quality:   Gradual Duration:  2 days Timing:  Constant Progression:  Worsening Chronicity:  Recurrent Context: not recent sexual activity   Relieved by:  Nothing Worsened by:  Nothing tried Ineffective treatments:  None tried Associated symptoms: no anorexia, no chills, no constipation, no diarrhea, no fever, no nausea, no sore throat and no vomiting     Past Medical History  Diagnosis Date  . No pertinent past medical history    Past Surgical History  Procedure Laterality Date  . No past surgeries     Family History  Problem Relation Age of Onset  . Cancer Maternal Aunt     uterine  . Diabetes Maternal Aunt   . Hypertension Maternal Aunt   . Cancer Maternal Grandmother     breastt  . Cancer Cousin     leaukemia   History  Substance Use Topics  . Smoking status: Former Smoker    Quit date: 08/29/2011  . Smokeless tobacco: Never Used  . Alcohol Use: No   OB History   Grav Para Term Preterm Abortions TAB SAB Ect Mult Living   3 2 2  0 1 1 0 0 0 2     Review of Systems  Constitutional: Negative for fever and chills.  HENT: Negative for hoarse voice and sore throat.   Gastrointestinal: Positive for abdominal pain. Negative for nausea, vomiting, diarrhea, constipation and anorexia.  Musculoskeletal: Negative for arthralgias and myalgias.  Skin: Positive for rash.  Neurological: Negative for headaches.  All other systems reviewed and are negative.  Allergies  Penicillins  Home Medications   Current Outpatient Rx  Name  Route  Sig  Dispense  Refill  . Chlorphen-Pseudoephed-APAP (THERAFLU FLU/COLD PO)   Oral   Take 1 Package by mouth daily as needed (cold).          BP 106/59  Pulse 64  Temp(Src) 98 F (36.7 C) (Oral)  Resp 14  SpO2 100%  LMP 01/05/2014 Physical Exam  Nursing note and vitals reviewed. Constitutional: She is oriented to person, place, and time. She appears well-developed and well-nourished. No distress.  HENT:  Head: Normocephalic and  atraumatic.  Right Ear: External ear normal.  Left Ear: External ear normal.  Nose: Nose normal.  Mouth/Throat: Oropharynx is clear and moist. No oropharyngeal exudate.  Eyes: Conjunctivae are normal. Pupils are equal, round, and reactive to light. No scleral icterus.  Neck: Normal range of motion. Neck supple.  Cardiovascular: Normal rate, regular rhythm and normal heart sounds.  Exam reveals no gallop and no friction rub.   No murmur heard. Pulmonary/Chest: Effort normal and breath sounds normal. No respiratory distress. She has no wheezes. She has no rales. She exhibits no tenderness.  Abdominal: Soft. Bowel sounds are normal. She exhibits no distension and no mass. There is tenderness in the suprapubic area. There is no rebound, no guarding and no CVA tenderness.    Genitourinary: There is no rash, tenderness or lesion on the right labia. There is no rash, tenderness or lesion on the left labia. Uterus is not enlarged and not tender. Cervix exhibits discharge. Cervix exhibits no motion tenderness and no friability. Right adnexum displays no mass, no tenderness and no fullness. Left adnexum displays no mass, no tenderness and no fullness. No erythema or bleeding around the vagina. Vaginal discharge found.  Musculoskeletal: Normal range of motion. She exhibits no edema and no tenderness.  Lymphadenopathy:    She has no cervical adenopathy.  Neurological: She is alert and oriented to person, place, and time. She exhibits normal muscle tone. Coordination normal.  Skin: Skin is warm and dry. Rash noted. No erythema. No pallor.  Diffuse macular rash noted to bilateral arms and legs, no erythema, no pustules, no vesicles  Psychiatric: She has a normal mood and affect. Her behavior is normal. Judgment and thought content normal.    ED Course  Procedures (including critical care time) Labs Review Labs Reviewed  WET PREP, GENITAL - Abnormal; Notable for the following:    Trich, Wet Prep  MODERATE (*)    WBC, Wet Prep HPF POC FEW (*)    All other components within normal limits  URINALYSIS, ROUTINE W REFLEX MICROSCOPIC - Abnormal; Notable for the following:    APPearance CLOUDY (*)    Hgb urine dipstick SMALL (*)    Leukocytes, UA MODERATE (*)    All other components within normal limits  URINE MICROSCOPIC-ADD ON - Abnormal; Notable for the following:    Bacteria, UA FEW (*)    All other components within normal limits  GC/CHLAMYDIA PROBE AMP  URINE CULTURE  POCT PREGNANCY, URINE   Imaging Review No results found.  EKG Interpretation   None      Results for orders placed during the hospital encounter of 01/10/14  WET PREP, GENITAL      Result Value Range   Yeast Wet Prep HPF POC NONE SEEN  NONE SEEN   Trich, Wet Prep MODERATE (*) NONE SEEN   Clue Cells Wet Prep HPF POC NONE SEEN  NONE SEEN  WBC, Wet Prep HPF POC FEW (*) NONE SEEN  URINALYSIS, ROUTINE W REFLEX MICROSCOPIC      Result Value Range   Color, Urine YELLOW  YELLOW   APPearance CLOUDY (*) CLEAR   Specific Gravity, Urine 1.028  1.005 - 1.030   pH 6.0  5.0 - 8.0   Glucose, UA NEGATIVE  NEGATIVE mg/dL   Hgb urine dipstick SMALL (*) NEGATIVE   Bilirubin Urine NEGATIVE  NEGATIVE   Ketones, ur NEGATIVE  NEGATIVE mg/dL   Protein, ur NEGATIVE  NEGATIVE mg/dL   Urobilinogen, UA 0.2  0.0 - 1.0 mg/dL   Nitrite NEGATIVE  NEGATIVE   Leukocytes, UA MODERATE (*) NEGATIVE  URINE MICROSCOPIC-ADD ON      Result Value Range   Squamous Epithelial / LPF RARE  RARE   WBC, UA 21-50  <3 WBC/hpf   RBC / HPF 3-6  <3 RBC/hpf   Bacteria, UA FEW (*) RARE   Urine-Other MUCOUS PRESENT    POCT PREGNANCY, URINE      Result Value Range   Preg Test, Ur NEGATIVE  NEGATIVE   No results found.  Medications  hydrocortisone cream 1 % ( Topical Given 01/10/14 0929)  azithromycin (ZITHROMAX) powder 1 g (1 g Oral Given 01/10/14 0933)  metroNIDAZOLE (FLAGYL) tablet 2,000 mg (2,000 mg Oral Given 01/10/14 0933)  cefTRIAXone  (ROCEPHIN) injection 250 mg (250 mg Intramuscular Given 01/10/14 0934)  lidocaine (PF) (XYLOCAINE) 1 % injection (5 mLs  Given 01/10/14 0934)     MDM  Dermatitis Trich PID  Patient here with rash, likely related to insect bites, doubt scabies, bedbugs as no one else with similar rash.  Also with trich on wet prep and symptoms similar to previous PID.  Treated here for same.     Izola Price Marisue Hum[place], [place] 01/10/14 [phone]

## 2014-01-10 NOTE — Discharge Instructions (Signed)
Pelvic Inflammatory Disease °Pelvic inflammatory disease (PID) refers to an infection in some or all of the female organs. The infection can be in the uterus, ovaries, fallopian tubes, or the surrounding tissues in the pelvis. PID can cause abdominal or pelvic pain that comes on suddenly (acute pelvic pain). PID is a serious infection because it can lead to lasting (chronic) pelvic pain or the inability to have children (infertile).  °CAUSES  °The infection is often caused by the normal bacteria found in the vaginal tissues. PID may also be caused by an infection that is spread during sexual contact. PID can also occur following:  °· The birth of a baby.   °· A miscarriage.   °· An abortion.   °· Major pelvic surgery.   °· The use of an intrauterine device (IUD).   °· A sexual assault.   °RISK FACTORS °Certain factors can put a person at higher risk for PID, such as: °· Being younger than 25 years. °· Being sexually active at a young age. °· Using nonbarrier contraception. °· Having multiple sexual partners. °· Having sex with someone who has symptoms of a genital infection. °· Using oral contraception. °Other times, certain behaviors can increase the possibility of getting PID, such as: °· Having sex during your period. °· Using a vaginal douche. °· Having an intrauterine device (IUD) in place. °SYMPTOMS  °· Abdominal or pelvic pain.   °· Fever.   °· Chills.   °· Abnormal vaginal discharge. °· Abnormal uterine bleeding.   °· Unusual pain shortly after finishing your period. °DIAGNOSIS  °Your caregiver will choose some of the following methods to make a diagnosis, such as:  °· Performing a physical exam and history. A pelvic exam typically reveals a very tender uterus and surrounding pelvis.   °· Ordering laboratory tests including a pregnancy test, blood tests, and urine test.  °· Ordering cultures of the vagina and cervix to check for a sexually transmitted infection (STI). °· Performing an ultrasound.    °· Performing a laparoscopic procedure to look inside the pelvis.   °TREATMENT  °· Antibiotic medicines may be prescribed and taken by mouth.   °· Sexual partners may be treated when the infection is caused by a sexually transmitted disease (STD).   °· Hospitalization may be needed to give antibiotics intravenously. °· Surgery may be needed, but this is rare. °It may take weeks until you are completely well. If you are diagnosed with PID, you should also be checked for human immunodeficiency virus (HIV).   °HOME CARE INSTRUCTIONS  °· If given, take your antibiotics as directed. Finish the medicine even if you start to feel better.   °· Only take over-the-counter or prescription medicines for pain, discomfort, or fever as directed by your caregiver.   °· Do not have sexual intercourse until treatment is completed or as directed by your caregiver. If PID is confirmed, your recent sexual partner(s) will need treatment.   °· Keep your follow-up appointments. °SEEK MEDICAL CARE IF:  °· You have increased or abnormal vaginal discharge.   °· You need prescription medicine for your pain.   °· You vomit.   °· You cannot take your medicines.   °· Your partner has an STD.   °SEEK IMMEDIATE MEDICAL CARE IF:  °· You have a fever.   °· You have increased abdominal or pelvic pain.   °· You have chills.   °· You have pain when you urinate.   °· You are not better after 72 hours following treatment.   °MAKE SURE YOU:  °· Understand these instructions. °· Will watch your condition. °· Will get help right away if you are not doing well or get worse. °  Document Released: 12/07/2005 Document Revised: 04/03/2013 Document Reviewed: 12/03/2011 [facility] Patient Information 2014 [place], [place].  Rash A rash is a change in the color or texture of your skin. There are many different types of rashes. You may have other problems that accompany your rash. CAUSES   Infections.  Allergic reactions. This can include allergies to pets or  foods.  Certain medicines.  Exposure to certain chemicals, soaps, or cosmetics.  Heat.  Exposure to poisonous plants.  Tumors, both cancerous and noncancerous. SYMPTOMS   Redness.  Scaly skin.  Itchy skin.  Dry or cracked skin.  Bumps.  Blisters.  Pain. DIAGNOSIS  Your caregiver may do a physical exam to determine what type of rash you have. A skin sample (biopsy) may be taken and examined under a microscope. TREATMENT  Treatment depends on the type of rash you have. Your caregiver may prescribe certain medicines. For serious conditions, you may need to see a skin doctor (dermatologist). HOME CARE INSTRUCTIONS   Avoid the substance that caused your rash.  Do not scratch your rash. This can cause infection.  You may take cool baths to help stop itching.  Only take over-the-counter or prescription medicines as directed by your caregiver.  Keep all follow-up appointments as directed by your caregiver. SEEK IMMEDIATE MEDICAL CARE IF:  You have increasing pain, swelling, or redness.  You have a fever.  You have new or severe symptoms.  You have body aches, diarrhea, or vomiting.  Your rash is not better after 3 days. MAKE SURE YOU:  Understand these instructions.  Will watch your condition.  Will get help right away if you are not doing well or get worse. Document Released: 11/27/2002 Document Revised: 02/29/2012 Document Reviewed: 09/21/2011 [facility] Patient Information 2014 [place], [place].  Trichomoniasis Trichomoniasis is an infection, caused by the Trichomonas organism, that affects both women and men. In women, the outer female genitalia and the vagina are affected. In men, the penis is mainly affected, but the prostate and other reproductive organs can also be involved. Trichomoniasis is a sexually transmitted disease (STD) and is most often passed to another person through sexual contact. The majority of people who get trichomoniasis do so from a  sexual encounter and are also at risk for other STDs. CAUSES   Sexual intercourse with an infected partner.  It can be present in swimming pools or hot tubs. SYMPTOMS   Abnormal gray-green frothy vaginal discharge in women.  Vaginal itching and irritation in women.  Itching and irritation of the area outside the vagina in women.  Penile discharge with or without pain in males.  Inflammation of the urethra (urethritis), causing painful urination.  Bleeding after sexual intercourse. RELATED COMPLICATIONS  Pelvic inflammatory disease.  Infection of the uterus (endometritis).  Infertility.  Tubal (ectopic) pregnancy.  It can be associated with other STDs, including gonorrhea and chlamydia, hepatitis B, and HIV. COMPLICATIONS DURING PREGNANCY  Early (premature) delivery.  Premature rupture of the membranes (PROM).  Low birth weight. DIAGNOSIS   Visualization of Trichomonas under the microscope from the vagina discharge.  Ph of the vagina greater than 4.5, tested with a test tape.  Trich Rapid Test.  Culture of the organism, but this is not usually needed.  It may be found on a Pap test.  Having a "strawberry cervix,"which means the cervix looks very red like a strawberry. TREATMENT   You may be given medication to fight the infection. Inform your caregiver if you could be or are pregnant. Some  medications used to treat the infection should not be taken during pregnancy.  Over-the-counter medications or creams to decrease itching or irritation may be recommended.  Your sexual partner will need to be treated if infected. HOME CARE INSTRUCTIONS   Take all medication prescribed by your caregiver.  Take over-the-counter medication for itching or irritation as directed by your caregiver.  Do not have sexual intercourse while you have the infection.  Do not douche or wear tampons.  Discuss your infection with your partner, as your partner may have acquired the  infection from you. Or, your partner may have been the person who transmitted the infection to you.  Have your sex partner examined and treated if necessary.  Practice safe, informed, and protected sex.  See your caregiver for other STD testing. SEEK MEDICAL CARE IF:   You still have symptoms after you finish the medication.  You have an oral temperature above 102 F (38.9 C).  You develop belly (abdominal) pain.  You have pain when you urinate.  You have bleeding after sexual intercourse.  You develop a rash.  The medication makes you sick or makes you throw up (vomit). Document Released: 06/02/2001 Document Revised: 02/29/2012 Document Reviewed: 06/28/2009 [facility] Patient Information 2014 [place]

## 2014-01-10 NOTE — ED Notes (Signed)
Pelvic cart set up in patient room.   Patient is gowned and ready for pelvic exam.

## 2014-01-11 LAB — URINE CULTURE: Colony Count: 10000

## 2014-01-11 LAB — GC/CHLAMYDIA PROBE AMP
CT PROBE, AMP APTIMA: NEGATIVE
GC Probe RNA: NEGATIVE

## 2014-01-12 NOTE — ED Provider Notes (Signed)
Medical screening examination/treatment/procedure(s) were performed by non-physician practitioner and as supervising physician I was immediately available for consultation/collaboration.  EKG Interpretation   None         [name], MD 01/12/14 [phone]

## 2014-02-28 ENCOUNTER — Emergency Department (HOSPITAL_COMMUNITY)
Admission: EM | Admit: 2014-02-28 | Discharge: 2014-03-01 | Disposition: A | Discharge status: Home / Self Care | Payer: Medicaid Other | Attending: Emergency Medicine | Admitting: Emergency Medicine

## 2014-02-28 ENCOUNTER — Encounter (HOSPITAL_COMMUNITY): Payer: Self-pay | Admitting: Emergency Medicine

## 2014-02-28 DIAGNOSIS — Z87891 Personal history of nicotine dependence: Secondary | ICD-10-CM | POA: Insufficient documentation

## 2014-02-28 DIAGNOSIS — N898 Other specified noninflammatory disorders of vagina: Secondary | ICD-10-CM | POA: Insufficient documentation

## 2014-02-28 DIAGNOSIS — R52 Pain, unspecified: Secondary | ICD-10-CM | POA: Insufficient documentation

## 2014-02-28 DIAGNOSIS — R109 Unspecified abdominal pain: Secondary | ICD-10-CM | POA: Insufficient documentation

## 2014-02-28 DIAGNOSIS — M549 Dorsalgia, unspecified: Secondary | ICD-10-CM | POA: Insufficient documentation

## 2014-02-28 DIAGNOSIS — O9989 Other specified diseases and conditions complicating pregnancy, childbirth and the puerperium: Secondary | ICD-10-CM | POA: Insufficient documentation

## 2014-02-28 DIAGNOSIS — O210 Mild hyperemesis gravidarum: Secondary | ICD-10-CM | POA: Insufficient documentation

## 2014-02-28 DIAGNOSIS — O219 Vomiting of pregnancy, unspecified: Secondary | ICD-10-CM

## 2014-02-28 DIAGNOSIS — Z880 Allergy status to penicillin: Secondary | ICD-10-CM | POA: Insufficient documentation

## 2014-02-28 LAB — COMPREHENSIVE METABOLIC PANEL
ALT: 6 U/L (ref 0–35)
AST: 12 U/L (ref 0–37)
Albumin: 3.8 g/dL (ref 3.5–5.2)
Alkaline Phosphatase: 35 U/L — ABNORMAL LOW (ref 39–117)
BUN: 8 mg/dL (ref 6–23)
CHLORIDE: 98 meq/L (ref 96–112)
CO2: 22 meq/L (ref 19–32)
Calcium: 9.2 mg/dL (ref 8.4–10.5)
Creatinine, Ser: 0.6 mg/dL (ref 0.50–1.10)
GFR calc Af Amer: >90 mL/min (ref >90)
GFR calc non Af Amer: >90 mL/min (ref >90)
Glucose, Bld: 86 mg/dL (ref 70–99)
POTASSIUM: 3.4 meq/L — AB (ref 3.7–5.3)
SODIUM: 135 meq/L — AB (ref 137–147)
Total Bilirubin: 0.9 mg/dL (ref 0.3–1.2)
Total Protein: 7.3 g/dL (ref 6.0–8.3)

## 2014-02-28 LAB — CBC WITH DIFFERENTIAL/PLATELET
BASOS ABS: 0 10*3/uL (ref 0.0–0.1)
Basophils Relative: 0 % (ref 0–1)
Eosinophils Absolute: 0 10*3/uL (ref 0.0–0.7)
Eosinophils Relative: 0 % (ref 0–5)
HCT: 33.4 % — ABNORMAL LOW (ref 36.0–46.0)
Hemoglobin: 11.1 g/dL — ABNORMAL LOW (ref 12.0–15.0)
LYMPHS PCT: 17 % (ref 12–46)
Lymphs Abs: 0.7 10*3/uL (ref 0.7–4.0)
MCH: 27.1 pg (ref 26.0–34.0)
MCHC: 33.2 g/dL (ref 30.0–36.0)
MCV: 81.7 fL (ref 78.0–100.0)
Monocytes Absolute: 0.6 10*3/uL (ref 0.1–1.0)
Monocytes Relative: 15 % — ABNORMAL HIGH (ref 3–12)
NEUTROS ABS: 2.5 10*3/uL (ref 1.7–7.7)
NEUTROS PCT: 67 % (ref 43–77)
PLATELETS: 165 10*3/uL (ref 150–400)
RBC: 4.09 MIL/uL (ref 3.87–5.11)
RDW: 14.8 % (ref 11.5–15.5)
WBC: 3.8 10*3/uL — AB (ref 4.0–10.5)

## 2014-02-28 LAB — WET PREP, GENITAL
Trich, Wet Prep: NONE SEEN
YEAST WET PREP: NONE SEEN

## 2014-02-28 LAB — LIPASE, BLOOD: Lipase: 11 U/L (ref 11–59)

## 2014-02-28 LAB — POC URINE PREG, ED: PREG TEST UR: POSITIVE — AB

## 2014-02-28 MED ORDER — ONDANSETRON HCL 4 MG/2ML IJ SOLN
4.0000 mg | Freq: Once | INTRAMUSCULAR | Status: AC
  Administered 2014-02-28: 4 mg via INTRAVENOUS
  Filled 2014-02-28: qty 2

## 2014-02-28 MED ORDER — SODIUM CHLORIDE 0.9 % IV BOLUS (SEPSIS)
1000.0000 mL | Freq: Once | INTRAVENOUS | Status: AC
  Administered 2014-02-28: 1000 mL via INTRAVENOUS

## 2014-02-28 MED ORDER — ONDANSETRON 4 MG PO TBDP
4.0000 mg | ORAL_TABLET | Freq: Once | ORAL | Status: AC
  Administered 2014-02-28: 4 mg via ORAL
  Filled 2014-02-28: qty 1

## 2014-02-28 MED ORDER — ACETAMINOPHEN 325 MG PO TABS
650.0000 mg | ORAL_TABLET | Freq: Once | ORAL | Status: AC
  Administered 2014-03-01: 650 mg via ORAL
  Filled 2014-02-28: qty 2

## 2014-02-28 MED ORDER — KETOROLAC TROMETHAMINE 30 MG/ML IJ SOLN
30.0000 mg | Freq: Once | INTRAMUSCULAR | Status: DC
  Filled 2014-02-28: qty 1

## 2014-02-28 NOTE — ED Notes (Signed)
Nurse First Rounds : Nurse explained delay / process and wait time to pt. VSS / no distress/ no nausea.

## 2014-02-28 NOTE — ED Notes (Signed)
Pt states that she has been vomiting for the past 3 days. Pt states HA, generalized abdominal pain and back pain. Pt states that she has tried to drink fluids and they have not stayed down.

## 2014-02-28 NOTE — ED Provider Notes (Signed)
CSN: [id number]     Arrival date & time 02/28/14  1847 History   First MD Initiated Contact with Patient 02/28/14 2304     Chief Complaint  Patient presents with  . Emesis  . Generalized Body Aches   HPI  History provided by the patient. Patient is a 23 year old female with no significant PMH who presents with complaints of nausea and vomiting. Patient reports first having some nausea beginning last week. She had some occasional episodes of vomiting but states her symptoms worsened over the last 3 days. She states she has not been nearly any solid foods and is only been drinking small amounts of liquids. She denies any associated diarrhea. She does have some lower abdomen and occasional back pain and cramps. She also reports small amount of vaginal discharge. Last normal menstrual period was in February. Denies any vaginal bleeding. She is sexually active with a prior history of gonorrhea and Chlamydia last November. She was treated for this. Denies any associated fever, chills or sweats. Denies any urinary complaints.    Past Medical History  Diagnosis Date  . No pertinent past medical history    Past Surgical History  Procedure Laterality Date  . No past surgeries     Family History  Problem Relation Age of Onset  . Cancer Maternal Aunt     uterine  . Diabetes Maternal Aunt   . Hypertension Maternal Aunt   . Cancer Maternal Grandmother     breastt  . Cancer Cousin     leaukemia   History  Substance Use Topics  . Smoking status: Former Smoker    Quit date: 08/29/2011  . Smokeless tobacco: Never Used  . Alcohol Use: No   OB History   Grav Para Term Preterm Abortions TAB SAB Ect Mult Living   3 2 2  0 1 1 0 0 0 2     Review of Systems  Constitutional: Negative for fever, chills and diaphoresis.  Gastrointestinal: Positive for nausea, vomiting and abdominal pain. Negative for diarrhea, constipation and blood in stool.  Genitourinary: Positive for vaginal discharge.  Negative for dysuria, frequency, hematuria, flank pain, vaginal bleeding, menstrual problem and pelvic pain.  Musculoskeletal: Positive for back pain.  All other systems reviewed and are negative.      Allergies  Penicillins  Home Medications  No current outpatient prescriptions on file. BP 111/60  Pulse 85  Temp(Src) 99 F (37.2 C) (Oral)  Resp 20  SpO2 95% Physical Exam  Nursing note and vitals reviewed. Constitutional: She is oriented to person, place, and time. She appears well-developed and well-nourished. No distress.  HENT:  Head: Normocephalic.  Mouth/Throat: Oropharynx is clear and moist.  Cardiovascular: Normal rate and regular rhythm.   Pulmonary/Chest: Effort normal and breath sounds normal. No respiratory distress. She has no wheezes.  Abdominal: Soft. Bowel sounds are normal. There is no tenderness. There is no rebound and no guarding.  Genitourinary:  Chaperone was present.  Large amount of white vaginal discharge. No significant pain in the adnexa bilaterally. No masses. No CMT. No friability or other bleeding. Cervix closed  Neurological: She is alert and oriented to person, place, and time.  Skin: Skin is warm and dry. No rash noted.  Psychiatric: She has a normal mood and affect. Her behavior is normal.    ED Course  Procedures   DIAGNOSTIC STUDIES: Oxygen Saturation is 100% on room air.    COORDINATION OF CARE:  Nursing notes reviewed. Vital signs reviewed. Initial pt  interview and examination performed.   11:20 PM-patient seen and evaluated. She appears well no acute distress. Does not appear severely dehydrated. Primarily with symptoms of nausea and vomiting some lower abdomen and back pain and discomfort. Also reports small amounts of vaginal discharge with previous history of gonorrhea and Chlamydia last November. Discussed work up plan with pt at bedside, which includes lab testing, urine studies and pelvic exam. Pt agrees with plan.  Patient  with positive pregnancy. This is her third pregnancy. She has 2 live children and one elective abortion previously. Findings discussed with the patient. She does report some similar nausea vomiting symptoms with her second pregnancy. Her abdomen is soft without any pain at this time. No significant pains on pelvic exam. Cervix closed. No vaginal bleeding. At this time discussed need for followup with OB/GYN. Patient agrees with plan.   Treatment plan initiated: Medications  sodium chloride 0.9 % bolus 1,000 mL (not administered)  ondansetron (ZOFRAN) injection 4 mg (not administered)  ketorolac (TORADOL) 30 MG/ML injection 30 mg (not administered)  ondansetron (ZOFRAN-ODT) disintegrating tablet 4 mg (4 mg Oral Given 02/28/14 1926)      Results for orders placed during the hospital encounter of 02/28/14  WET PREP, GENITAL      Result Value Ref Range   Yeast Wet Prep HPF POC NONE SEEN  NONE SEEN   Trich, Wet Prep NONE SEEN  NONE SEEN   Clue Cells Wet Prep HPF POC FEW (*) NONE SEEN   WBC, Wet Prep HPF POC FEW (*) NONE SEEN  CBC WITH DIFFERENTIAL      Result Value Ref Range   WBC 3.8 (*) 4.0 - 10.5 K/uL   RBC 4.09  3.87 - 5.11 MIL/uL   Hemoglobin 11.1 (*) 12.0 - 15.0 g/dL   HCT 16.1 (*) 09.6 - 04.5 %   MCV 81.7  78.0 - 100.0 fL   MCH 27.1  26.0 - 34.0 pg   MCHC 33.2  30.0 - 36.0 g/dL   RDW 40.9  81.1 - 91.4 %   Platelets 165  150 - 400 K/uL   Neutrophils Relative % 67  43 - 77 %   Neutro Abs 2.5  1.7 - 7.7 K/uL   Lymphocytes Relative 17  12 - 46 %   Lymphs Abs 0.7  0.7 - 4.0 K/uL   Monocytes Relative 15 (*) 3 - 12 %   Monocytes Absolute 0.6  0.1 - 1.0 K/uL   Eosinophils Relative 0  0 - 5 %   Eosinophils Absolute 0.0  0.0 - 0.7 K/uL   Basophils Relative 0  0 - 1 %   Basophils Absolute 0.0  0.0 - 0.1 K/uL  COMPREHENSIVE METABOLIC PANEL      Result Value Ref Range   Sodium 135 (*) 137 - 147 mEq/L   Potassium 3.4 (*) 3.7 - 5.3 mEq/L   Chloride 98  96 - 112 mEq/L   CO2 22  19 - 32  mEq/L   Glucose, Bld 86  70 - 99 mg/dL   BUN 8  6 - 23 mg/dL   Creatinine, Ser 7.82  0.50 - 1.10 mg/dL   Calcium 9.2  8.4 - 95.6 mg/dL   Total Protein 7.3  6.0 - 8.3 g/dL   Albumin 3.8  3.5 - 5.2 g/dL   AST 12  0 - 37 U/L   ALT 6  0 - 35 U/L   Alkaline Phosphatase 35 (*) 39 - 117 U/L   Total Bilirubin  0.9  0.3 - 1.2 mg/dL   GFR calc non Af Amer >90  >90 mL/min   GFR calc Af Amer >90  >90 mL/min  LIPASE, BLOOD      Result Value Ref Range   Lipase 11  11 - 59 U/L  URINALYSIS, ROUTINE W REFLEX MICROSCOPIC      Result Value Ref Range   Color, Urine AMBER (*) YELLOW   APPearance CLOUDY (*) CLEAR   Specific Gravity, Urine 1.029  1.005 - 1.030   pH 6.0  5.0 - 8.0   Glucose, UA NEGATIVE  NEGATIVE mg/dL   Hgb urine dipstick NEGATIVE  NEGATIVE   Bilirubin Urine SMALL (*) NEGATIVE   Ketones, ur >80 (*) NEGATIVE mg/dL   Protein, ur 30 (*) NEGATIVE mg/dL   Urobilinogen, UA 1.0  0.0 - 1.0 mg/dL   Nitrite NEGATIVE  NEGATIVE   Leukocytes, UA NEGATIVE  NEGATIVE  URINE MICROSCOPIC-ADD ON      Result Value Ref Range   Squamous Epithelial / LPF MANY (*) RARE   WBC, UA 0-2  <3 WBC/hpf   RBC / HPF 3-6  <3 RBC/hpf   Bacteria, UA RARE  RARE   Urine-Other MUCOUS PRESENT    POC URINE PREG, ED      Result Value Ref Range   Preg Test, Ur POSITIVE (*) NEGATIVE         MDM   Final diagnoses:  Nausea/vomiting in pregnancy        Angus Sellereter S Kaan Tosh, PA-C 03/01/14 [phone]

## 2014-03-01 LAB — URINALYSIS, ROUTINE W REFLEX MICROSCOPIC
GLUCOSE, UA: NEGATIVE mg/dL
HGB URINE DIPSTICK: NEGATIVE
Ketones, ur: >80 mg/dL — AB
Leukocytes, UA: NEGATIVE
Nitrite: NEGATIVE
Protein, ur: 30 mg/dL — AB
Specific Gravity, Urine: 1.029 (ref 1.005–1.030)
Urobilinogen, UA: 1 mg/dL (ref 0.0–1.0)
pH: 6 (ref 5.0–8.0)

## 2014-03-01 LAB — URINE MICROSCOPIC-ADD ON

## 2014-03-01 LAB — HIV ANTIBODY (ROUTINE TESTING W REFLEX): HIV: NONREACTIVE

## 2014-03-01 MED ORDER — PRENATAL COMPLETE 14-0.4 MG PO TABS: 1.0000 | ORAL_TABLET | Freq: Every day | ORAL | Status: DC

## 2014-03-01 MED ORDER — ONDANSETRON 8 MG PO TBDP: ORAL_TABLET | ORAL | Status: DC

## 2014-03-01 NOTE — Discharge Instructions (Signed)
Please followup with a primary care provider and OB/GYN specialist for continued evaluation and treatment of your symptoms. Return anytime for changing or worsening symptoms including persistent nausea and vomiting, continuous abdominal pain, vaginal bleeding, fever, chills or sweats.     Morning Sickness Morning sickness is when you feel sick to your stomach (nauseous) during pregnancy. This nauseous feeling may or may not come with vomiting. It often occurs in the morning but can be a problem any time of day. Morning sickness is most common during the first trimester, but it may continue throughout pregnancy. While morning sickness is unpleasant, it is usually harmless unless you develop severe and continual vomiting (hyperemesis gravidarum). This condition requires more intense treatment.  CAUSES  The cause of morning sickness is not completely known but seems to be related to normal hormonal changes that occur in pregnancy. RISK FACTORS You are at greater risk if you:  Experienced nausea or vomiting before your pregnancy.  Had morning sickness during a previous pregnancy.  Are pregnant with more than one baby, such as twins. TREATMENT  Do not use any medicines (prescription, over-the-counter, or herbal) for morning sickness without first talking to your health care provider. Your health care provider may prescribe or recommend:  Vitamin B6 supplements.  Anti-nausea medicines.  The herbal medicine ginger. HOME CARE INSTRUCTIONS   Only take over-the-counter or prescription medicines as directed by your health care provider.  Taking multivitamins before getting pregnant can prevent or decrease the severity of morning sickness in most women.   Eat a piece of dry toast or unsalted crackers before getting out of bed in the morning.   Eat five or six small meals a day.   Eat dry and bland foods (rice, baked potato). Foods high in carbohydrates are often helpful.  Do not drink  liquids with your meals. Drink liquids between meals.   Avoid greasy, fatty, and spicy foods.   Get someone to cook for you if the smell of any food causes nausea and vomiting.   If you feel nauseous after taking prenatal vitamins, take the vitamins at night or with a snack.  Snack on protein foods (nuts, yogurt, cheese) between meals if you are hungry.   Eat unsweetened gelatins for desserts.   Wearing an acupressure wristband (worn for sea sickness) may be helpful.   Acupuncture may be helpful.   Do not smoke.   Get a humidifier to keep the air in your house free of odors.   Get plenty of fresh air. SEEK MEDICAL CARE IF:   Your home remedies are not working, and you need medicine.  You feel dizzy or lightheaded.  You are losing weight. SEEK IMMEDIATE MEDICAL CARE IF:   You have persistent and uncontrolled nausea and vomiting.  You pass out (faint). Document Released: 01/28/2007 Document Revised: 08/09/2013 Document Reviewed: 05/24/2013 [facility] Patient Information 2014 [place], [place].

## 2014-03-01 NOTE — ED Provider Notes (Signed)
Medical screening examination/treatment/procedure(s) were performed by non-physician practitioner and as supervising physician I was immediately available for consultation/collaboration.   EKG Interpretation None        Courtney F Horton, MD 03/01/14 0525 

## 2014-03-02 LAB — GC/CHLAMYDIA PROBE AMP
CT PROBE, AMP APTIMA: NEGATIVE
GC Probe RNA: NEGATIVE

## 2014-03-26 ENCOUNTER — Inpatient Hospital Stay (HOSPITAL_COMMUNITY)
Admission: AD | Admit: 2014-03-26 | Discharge: 2014-03-27 | Disposition: A | Discharge status: Home / Self Care | Payer: Medicaid Other | Source: Ambulatory Visit | Attending: Obstetrics & Gynecology | Admitting: Obstetrics & Gynecology

## 2014-03-26 ENCOUNTER — Encounter (HOSPITAL_COMMUNITY): Payer: Self-pay | Admitting: *Deleted

## 2014-03-26 ENCOUNTER — Inpatient Hospital Stay (EMERGENCY_DEPARTMENT_HOSPITAL)
Admission: AD | Admit: 2014-03-26 | Discharge: 2014-03-26 | Disposition: A | Discharge status: Home / Self Care | Payer: Medicaid Other | Source: Ambulatory Visit | Attending: Obstetrics and Gynecology | Admitting: Obstetrics and Gynecology

## 2014-03-26 ENCOUNTER — Encounter (HOSPITAL_COMMUNITY): Payer: Self-pay

## 2014-03-26 ENCOUNTER — Inpatient Hospital Stay (HOSPITAL_COMMUNITY)
Admission: AD | Admit: 2014-03-26 | Discharge: 2014-03-26 | Disposition: A | Discharge status: Home / Self Care | Payer: Medicaid Other | Source: Ambulatory Visit | Attending: Obstetrics & Gynecology | Admitting: Obstetrics & Gynecology

## 2014-03-26 ENCOUNTER — Inpatient Hospital Stay (HOSPITAL_COMMUNITY): Payer: Medicaid Other

## 2014-03-26 DIAGNOSIS — R51 Headache: Secondary | ICD-10-CM | POA: Insufficient documentation

## 2014-03-26 DIAGNOSIS — R109 Unspecified abdominal pain: Secondary | ICD-10-CM

## 2014-03-26 DIAGNOSIS — O209 Hemorrhage in early pregnancy, unspecified: Secondary | ICD-10-CM

## 2014-03-26 DIAGNOSIS — O039 Complete or unspecified spontaneous abortion without complication: Secondary | ICD-10-CM | POA: Insufficient documentation

## 2014-03-26 DIAGNOSIS — Z87891 Personal history of nicotine dependence: Secondary | ICD-10-CM | POA: Insufficient documentation

## 2014-03-26 DIAGNOSIS — O208 Other hemorrhage in early pregnancy: Secondary | ICD-10-CM | POA: Insufficient documentation

## 2014-03-26 DIAGNOSIS — O468X1 Other antepartum hemorrhage, first trimester: Secondary | ICD-10-CM

## 2014-03-26 DIAGNOSIS — Z880 Allergy status to penicillin: Secondary | ICD-10-CM | POA: Insufficient documentation

## 2014-03-26 DIAGNOSIS — O219 Vomiting of pregnancy, unspecified: Secondary | ICD-10-CM

## 2014-03-26 DIAGNOSIS — O210 Mild hyperemesis gravidarum: Secondary | ICD-10-CM

## 2014-03-26 DIAGNOSIS — O26899 Other specified pregnancy related conditions, unspecified trimester: Secondary | ICD-10-CM

## 2014-03-26 DIAGNOSIS — O3671X0 Maternal care for viable fetus in abdominal pregnancy, first trimester, not applicable or unspecified: Secondary | ICD-10-CM

## 2014-03-26 DIAGNOSIS — Z6791 Unspecified blood type, Rh negative: Secondary | ICD-10-CM

## 2014-03-26 DIAGNOSIS — O418X10 Other specified disorders of amniotic fluid and membranes, first trimester, not applicable or unspecified: Secondary | ICD-10-CM

## 2014-03-26 HISTORY — DX: Other specified health status: Z78.9

## 2014-03-26 LAB — CBC
HCT: 31.3 % — ABNORMAL LOW (ref 36.0–46.0)
Hemoglobin: 10.1 g/dL — ABNORMAL LOW (ref 12.0–15.0)
MCH: 26.8 pg (ref 26.0–34.0)
MCHC: 32.3 g/dL (ref 30.0–36.0)
MCV: 83 fL (ref 78.0–100.0)
PLATELETS: 185 10*3/uL (ref 150–400)
RBC: 3.77 MIL/uL — AB (ref 3.87–5.11)
RDW: 14.9 % (ref 11.5–15.5)
WBC: 7.5 10*3/uL (ref 4.0–10.5)

## 2014-03-26 LAB — URINALYSIS, ROUTINE W REFLEX MICROSCOPIC
Bilirubin Urine: NEGATIVE
GLUCOSE, UA: NEGATIVE mg/dL
Ketones, ur: NEGATIVE mg/dL
Leukocytes, UA: NEGATIVE
Nitrite: NEGATIVE
PROTEIN: NEGATIVE mg/dL
Urobilinogen, UA: 0.2 mg/dL (ref 0.0–1.0)
pH: 6 (ref 5.0–8.0)

## 2014-03-26 LAB — HCG, QUANTITATIVE, PREGNANCY: hCG, Beta Chain, Quant, S: 42163 m[IU]/mL — ABNORMAL HIGH (ref <5)

## 2014-03-26 LAB — URINE MICROSCOPIC-ADD ON

## 2014-03-26 MED ORDER — HYDROMORPHONE HCL PF 1 MG/ML IJ SOLN
1.0000 mg | INTRAMUSCULAR | Status: AC
  Administered 2014-03-26: 1 mg via INTRAVENOUS
  Filled 2014-03-26: qty 1

## 2014-03-26 MED ORDER — RHO D IMMUNE GLOBULIN 1500 UNIT/2ML IJ SOLN
300.0000 ug | Freq: Once | INTRAMUSCULAR | Status: AC
  Administered 2014-03-26: 300 ug via INTRAMUSCULAR
  Filled 2014-03-26: qty 2

## 2014-03-26 MED ORDER — LACTATED RINGERS IV BOLUS (SEPSIS)
1000.0000 mL | Freq: Once | INTRAVENOUS | Status: AC
  Administered 2014-03-26: 1000 mL via INTRAVENOUS

## 2014-03-26 MED ORDER — OXYCODONE-ACETAMINOPHEN 5-325 MG PO TABS
1.0000 | ORAL_TABLET | Freq: Four times a day (QID) | ORAL | Status: DC | PRN
  Administered 2014-03-26: 1 via ORAL
  Filled 2014-03-26: qty 1

## 2014-03-26 MED ORDER — PROMETHAZINE HCL 25 MG PO TABS
25.0000 mg | ORAL_TABLET | Freq: Once | ORAL | Status: AC
  Administered 2014-03-26: 25 mg via ORAL
  Filled 2014-03-26: qty 1

## 2014-03-26 NOTE — MAU Note (Signed)
C/o cramping and bleeding that has increased since she was here last night;

## 2014-03-26 NOTE — Discharge Instructions (Signed)
Abdominal Pain During Pregnancy Abdominal pain is common in pregnancy. Most of the time, it does not cause harm. There are many causes of abdominal pain. Some causes are more serious than others. Some of the causes of abdominal pain in pregnancy are easily diagnosed. Occasionally, the diagnosis takes time to understand. Other times, the cause is not determined. Abdominal pain can be a sign that something is very wrong with the pregnancy, or the pain may have nothing to do with the pregnancy at all. For this reason, always tell your health care provider if you have any abdominal discomfort. HOME CARE INSTRUCTIONS  Monitor your abdominal pain for any changes. The following actions may help to alleviate any discomfort you are experiencing:  Do not have sexual intercourse or put anything in your vagina until your symptoms go away completely.  Get plenty of rest until your pain improves.  Drink clear fluids if you feel nauseous. Avoid solid food as long as you are uncomfortable or nauseous.  Only take over-the-counter or prescription medicine as directed by your health care provider.  Keep all follow-up appointments with your health care provider. SEEK IMMEDIATE MEDICAL CARE IF:  You are bleeding, leaking fluid, or passing tissue from the vagina.  You have increasing pain or cramping.  You have persistent vomiting.  You have painful or bloody urination.  You have a fever.  You notice a decrease in your baby's movements.  You have extreme weakness or feel faint.  You have shortness of breath, with or without abdominal pain.  You develop a severe headache with abdominal pain.  You have abnormal vaginal discharge with abdominal pain.  You have persistent diarrhea.  You have abdominal pain that continues even after rest, or gets worse. MAKE SURE YOU:   Understand these instructions.  Will watch your condition.  Will get help right away if you are not doing well or get  worse. Document Released: 12/07/2005 Document Revised: 09/27/2013 Document Reviewed: 07/06/2013 [facility] Patient Information 2014 [place], [place]. Morning Sickness Morning sickness is when you feel sick to your stomach (nauseous) during pregnancy. You may feel sick to your stomach and throw up (vomit). You may feel sick in the morning, but you can feel this way any time of day. Some women feel very sick to their stomach and cannot stop throwing up (hyperemesis gravidarum). HOME CARE  Only take medicines as told by your doctor.  Take multivitamins as told by your doctor. Taking multivitamins before getting pregnant can stop or lessen the harshness of morning sickness.  Eat dry toast or unsalted crackers before getting out of bed.  Eat 5 to 6 small meals a day.  Eat dry and bland foods like rice and baked potatoes.  Do not drink liquids with meals. Drink between meals.  Do not eat greasy, fatty, or spicy foods.  Have someone cook for you if the smell of food causes you to feel sick or throw up.  If you feel sick to your stomach after taking prenatal vitamins, take them at night or with a snack.  Eat protein when you need a snack (nuts, yogurt, cheese).  Eat unsweetened gelatins for dessert.  Wear a bracelet used for sea sickness (acupressure wristband).  Go to a doctor that puts thin needles into certain body points (acupuncture) to improve how you feel.  Do not smoke.  Use a humidifier to keep the air in your house free of odors.  Get lots of fresh air. GET HELP IF:  You need  medicine to feel better.  You feel dizzy or lightheaded.  You are losing weight. GET HELP RIGHT AWAY IF:   You feel very sick to your stomach and cannot stop throwing up.  You pass out (faint). Document Released: 01/14/2005 Document Revised: 08/09/2013 Document Reviewed: 05/24/2013 [facility] Patient Information 2014 [place], [place]. Subchorionic Hematoma A subchorionic hematoma is a gathering  of blood between the outer wall of the placenta and the inner wall of the womb (uterus). The placenta is the organ that connects the fetus to the wall of the uterus. The placenta performs the feeding, breathing (oxygen to the fetus), and waste removal (excretory work) of the fetus.  Subchorionic hematoma is the most common abnormality found on a result from ultrasonography done during the first trimester or early second trimester of pregnancy. If there has been little or no vaginal bleeding, early small hematomas usually shrink on their own and do not affect your baby or pregnancy. The blood is gradually absorbed over 1 2 weeks. When bleeding starts later in pregnancy or the hematoma is larger or occurs in an older pregnant woman, the outcome may not be as good. Larger hematomas may get bigger, which increases the chances for miscarriage. Subchorionic hematoma also increases the risk of premature detachment of the placenta from the uterus, preterm (premature) labor, and stillbirth. HOME CARE INSTRUCTIONS   Stay on bed rest if your health care provider recommends this. Although bed rest will not prevent more bleeding or prevent a miscarriage, your health care provider may recommend bed rest until you are advised otherwise.  Avoid heavy lifting (more than 10 lb [4.5 kg]), exercise, sexual intercourse, or douching as directed by your health care provider.  Keep track of the number of pads you use each day and how soaked (saturated) they are. Write down this information.  Do not use tampons.  Keep all follow-up appointments as directed by your health care provider. Your health care provider may ask you to have follow-up blood tests or ultrasound tests or both. SEEK IMMEDIATE MEDICAL CARE IF:   You have severe cramps in your stomach, back, abdomen, or pelvis.  You have a fever.  You pass large clots or tissue. Save any tissue for your health care provider to look at.  Your bleeding increases or you  become lightheaded, feel weak, or have fainting episodes. Document Released: 03/24/2007 Document Revised: 09/27/2013 Document Reviewed: 07/06/2013 [facility] Patient Information 2014 [place], [place]. Vaginal Bleeding During Pregnancy, First Trimester A small amount of bleeding (spotting) from the vagina is relatively common in early pregnancy. It usually stops on its own. Various things may cause bleeding or spotting in early pregnancy. Some bleeding may be related to the pregnancy, and some may not. In most cases, the bleeding is normal and is not a problem. However, bleeding can also be a sign of something serious. Be sure to tell your health care provider about any vaginal bleeding right away. Some possible causes of vaginal bleeding during the first trimester include:  Infection or inflammation of the cervix.  Growths (polyps) on the cervix.  Miscarriage or threatened miscarriage.  Pregnancy tissue has developed outside of the uterus and in a fallopian tube (tubal pregnancy).  Tiny cysts have developed in the uterus instead of pregnancy tissue (molar pregnancy). HOME CARE INSTRUCTIONS  Watch your condition for any changes. The following actions may help to lessen any discomfort you are feeling:  Follow your health care provider's instructions for limiting your activity. If your health care provider  orders bed rest, you may need to stay in bed and only get up to use the bathroom. However, your health care provider may allow you to continue light activity.  If needed, make plans for someone to help with your regular activities and responsibilities while you are on bed rest.  Keep track of the number of pads you use each day, how often you change pads, and how soaked (saturated) they are. Write this down.  Do not use tampons. Do not douche.  Do not have sexual intercourse or orgasms until approved by your health care provider.  If you pass any tissue from your vagina, save the tissue so  you can show it to your health care provider.  Only take over-the-counter or prescription medicines as directed by your health care provider.  Do not take aspirin because it can make you bleed.  Keep all follow-up appointments as directed by your health care provider. SEEK MEDICAL CARE IF:  You have any vaginal bleeding during any part of your pregnancy.  You have cramps or labor pains. SEEK IMMEDIATE MEDICAL CARE IF:   You have severe cramps in your back or belly (abdomen).  You have a fever, not controlled by medicine.  You pass large clots or tissue from your vagina.  Your bleeding increases.  You feel lightheaded or weak, or you have fainting episodes.  You have chills.  You are leaking fluid or have a gush of fluid from your vagina.  You pass out while having a bowel movement. MAKE SURE YOU:  Understand these instructions.  Will watch your condition.  Will get help right away if you are not doing well or get worse. Document Released: 09/16/2005 Document Revised: 09/27/2013 Document Reviewed: 08/14/2013 [facility]Exit[facility] Patient Information 2014 [place]Exit[place], [place].

## 2014-03-26 NOTE — MAU Provider Note (Signed)
History     CSN: [id number]  Arrival date and time: 03/26/14 1605 [id number]  First Provider Initiated Contact with Patient 03/26/14 1710      Chief Complaint  Patient presents with  . Vaginal Bleeding  . Back Pain  . Abdominal Pain   HPI [name] is a 23 y.o., [id number], 3852w1d presenting for ongoing pain and N/V. Patient was initially seen at midnight today for the same complaints; U/S was reassuring although it did reveal a subchorionic hemorrhage. Patient was d/c home with IM Rhogam in MAU and Zofran prescription which the patient reports she was unable to pick up. Patient states she has continued to feel pain despite taking 1 Tylenol and as well as nausea but denies vomiting. She also states that she has been unable eat since yesterday d/t her nausea.  Bleeding is lighter.   OB History   Grav Para Term Preterm Abortions TAB SAB Ect Mult Living   4 2 2  0 1 1 0 0 0 2      Past Medical History  Diagnosis Date  . No pertinent past medical history   . Medical history non-contributory     Past Surgical History  Procedure Laterality Date  . No past surgeries      Family History  Problem Relation Age of Onset  . Cancer Maternal Aunt     uterine  . Diabetes Maternal Aunt   . Hypertension Maternal Aunt   . Cancer Maternal Grandmother     breastt  . Cancer Cousin     leaukemia    History  Substance Use Topics  . Smoking status: Former Smoker    Quit date: 08/29/2011  . Smokeless tobacco: Never Used  . Alcohol Use: No    Allergies:  Allergies  Allergen Reactions  . Penicillins Other (See Comments)    Unknown, childhood allergy    Prescriptions prior to admission  Medication Sig Dispense Refill  . ondansetron (ZOFRAN ODT) 8 MG disintegrating tablet 8mg  ODT q4 hours prn nausea  20 tablet  0  . Prenatal Vit-Fe Fumarate-FA (PRENATAL COMPLETE) 14-0.4 MG TABS Take 1 tablet by mouth daily.  60 each  0    Review of Systems  Constitutional: Negative for fever and chills.   HENT: Negative for hearing loss and sore throat.   Eyes: Negative for blurred vision.  Respiratory: Negative for cough, shortness of breath and stridor.   Cardiovascular: Negative for chest pain and leg swelling.  Gastrointestinal: Positive for nausea and vomiting. Negative for abdominal pain, diarrhea, constipation and blood in stool.  Genitourinary: Negative for dysuria, urgency, frequency and hematuria.  Musculoskeletal: Negative for myalgias.  Neurological: Positive for headaches (mild headache x 1 day). Negative for dizziness.  Endo/Heme/Allergies: Does not bruise/bleed easily.   Physical Exam   Blood pressure 109/52, pulse 76, temperature 98.1 F (36.7 C), resp. rate 18, last menstrual period 01/05/2014, SpO2 100.00%.  Physical Exam  Vitals reviewed. Constitutional: She is oriented to person, place, and time. She appears well-developed and well-nourished.  HENT:  Head: Normocephalic.  Eyes: EOM are normal. Pupils are equal, round, and reactive to light.  Neck: Normal range of motion. No tracheal deviation present.  Cardiovascular: Normal rate, regular rhythm, normal heart sounds and intact distal pulses.   Respiratory: Effort normal and breath sounds normal. No stridor. No respiratory distress.  GI: Soft. Bowel sounds are normal. She exhibits no distension. There is no tenderness. There is no guarding.  Genitourinary: Pelvic exam was performed with patient supine.  There is no rash, tenderness or lesion on the right labia. There is no rash, tenderness or lesion on the left labia. Uterus is tender. Uterus is not deviated and not enlarged. Cervix exhibits no motion tenderness, no discharge and no friability. Right adnexum displays no mass, no tenderness and no fullness. Left adnexum displays no mass, no tenderness and no fullness. No erythema, tenderness or bleeding around the vagina. No foreign body around the vagina. No signs of injury around the vagina. No vaginal discharge found.   Blood clot observed and cleared without active hemorrhage.   Musculoskeletal: Normal range of motion. She exhibits no edema and no tenderness.  Neurological: She is alert and oriented to person, place, and time.  Skin: Skin is warm and dry.  Psychiatric: She has a normal mood and affect. Her behavior is normal.   Fetal heart rate by Doppler 163 MAU Course  Procedures  MDM  Phenergan 25mg  po given in MAU.  Patient wanted oral med.  Has someone to drive her home-discussed sedative effect of med.  Assessment and Plan  A:  Viable pregnancy at [redacted]w[redacted]d      Known subchorionic hemorrhagia      Lower abdominal pain       Nausea in pregnancy  P:  advised to pick up Zofran Rx.        Patient counseled on pelvic rest and diet including avoiding spicy foods to prevent recurrent N/V.      Begin prenatal care with Md of her choice      Advised to return to MAU for heavier bleeding or more pain   Wallis Bamberg 03/26/2014, 5:30 PM  I reviewed the student's note, observed his exam and reviewed findings and agree with plan of care .

## 2014-03-26 NOTE — MAU Provider Note (Signed)
Chief Complaint: Abdominal Pain and Vaginal Bleeding   First Provider Initiated Contact with Patient 03/26/14 2312     SUBJECTIVE HPI: Krystal Murray is a 23 y.o. [id number] at [redacted]w[redacted]d by U/S who presents to maternity admissions via EMS reporting severe abdominal pain and vaginal bleeding.  She has been to MAU x3 in 24 hours for this same complaint, with ultrasound 4/6 showing IUP [redacted]w[redacted]d with small SCH.  She is writhing in bed, crying, reporting she feels like she is in labor. She continues to have the same amount of light bleeding, without change in amount today.  She denies vaginal itching/burning, urinary symptoms, h/a, dizziness, n/v, or fever/chills.    Addendum to HPI:  While in MAU, pt bleeding increased and she passed several large clots.  Pt given bedpan and passed 12 week fetus while in MAU room.   Past Medical History  Diagnosis Date  . No pertinent past medical history   . Medical history non-contributory    Past Surgical History  Procedure Laterality Date  . No past surgeries     History   Social History  . Marital Status: Single    Spouse Name: N/A    Number of Children: N/A  . Years of Education: N/A   Occupational History  . Not on file.   Social History Main Topics  . Smoking status: Former Smoker    Quit date: 08/29/2011  . Smokeless tobacco: Never Used  . Alcohol Use: No  . Drug Use: No  . Sexual Activity: Yes     Comment: last sex two weeks ago   Other Topics Concern  . Not on file   Social History Narrative  . No narrative on file   No current facility-administered medications on file prior to encounter.   Current Outpatient Prescriptions on File Prior to Encounter  Medication Sig Dispense Refill  . ondansetron (ZOFRAN ODT) 8 MG disintegrating tablet 8mg  ODT q4 hours prn nausea  20 tablet  0  . Prenatal Vit-Fe Fumarate-FA (PRENATAL COMPLETE) 14-0.4 MG TABS Take 1 tablet by mouth daily.  60 each  0   Allergies  Allergen Reactions  . Penicillins  Other (See Comments)    Unknown, childhood allergy    ROS: Pertinent items in HPI  OBJECTIVE Blood pressure 106/63, pulse 68, temperature 98.8 F (37.1 C), temperature source Oral, resp. rate 18, last menstrual period 01/05/2014.  GENERAL: Well-developed, well-nourished female in moderate distress.  HEENT: Normocephalic HEART: normal rate RESP: normal effort ABDOMEN: Soft, non-tender EXTREMITIES: Nontender, no edema NEURO: Alert and oriented Pelvic exam: Cervix pink, visually closed, without lesion, large amount of dark red bleeding with clots, vaginal walls and external genitalia normal Bimanual exam: Cervix 0/long/high, firm, anterior, neg CMT, uterus tender, slightly enlarged, adnexa without tenderness, enlargement, or mass  LAB RESULTS  Blood Type A Neg, Rhophylac administered 03/26/14  Results for orders placed during the hospital encounter of 03/26/14 (from the past 168 hour(s))  CBC   Collection Time    03/26/14 11:35 PM      Result Value Ref Range   WBC 8.3  4.0 - 10.5 K/uL   RBC 4.05  3.87 - 5.11 MIL/uL   Hemoglobin 11.1 (*) 12.0 - 15.0 g/dL   HCT 30.8 (*) 65.7 - 84.6 %   MCV 83.2  78.0 - 100.0 fL   MCH 27.4  26.0 - 34.0 pg   MCHC 32.9  30.0 - 36.0 g/dL   RDW 96.2  95.2 - 84.1 %  Platelets 183  150 - 400 K/uL  CBC   Collection Time    03/27/14  4:00 AM      Result Value Ref Range   WBC 10.5  4.0 - 10.5 K/uL   RBC 3.30 (*) 3.87 - 5.11 MIL/uL   Hemoglobin 8.9 (*) 12.0 - 15.0 g/dL   HCT 40.9 (*) 81.1 - 91.4 %   MCV 83.0  78.0 - 100.0 fL   MCH 27.0  26.0 - 34.0 pg   MCHC 32.5  30.0 - 36.0 g/dL   RDW 78.2  95.6 - 21.3 %   Platelets 176  150 - 400 K/uL  Results for orders placed during the hospital encounter of 03/26/14 (from the past 168 hour(s))  CBC   Collection Time    03/26/14 12:16 AM      Result Value Ref Range   WBC 7.5  4.0 - 10.5 K/uL   RBC 3.77 (*) 3.87 - 5.11 MIL/uL   Hemoglobin 10.1 (*) 12.0 - 15.0 g/dL   HCT 08.6 (*) 57.8 - 46.9 %   MCV 83.0   78.0 - 100.0 fL   MCH 26.8  26.0 - 34.0 pg   MCHC 32.3  30.0 - 36.0 g/dL   RDW 62.9  52.8 - 41.3 %   Platelets 185  150 - 400 K/uL     IMAGING  US Transvaginal OB  03/27/14   CLINICAL DATA: Passed fetus in MAU with heavy bleeding. Evaluate  for retained products of conception.  EXAM:  OBSTETRIC <14 WK ULTRASOUND  TECHNIQUE:  Transabdominal ultrasound was performed for evaluation of the  gestation as well as the maternal uterus and adnexal regions.  COMPARISON: 03/26/2014  FINDINGS:  Intrauterine gestational sac: Not visualized. The previously noted  intrauterine pregnancy is no longer seen. There is moderate  thickening of the endometrium measuring 34 mm. Endometrium is mildly  heterogeneous. There is only very minimal vascularity along the  periphery of the endometrium.  Maternal uterus/adnexae: Left ovary not visualized. Right ovary is  within normal. There is no free fluid.  IMPRESSION:  Thickened somewhat heterogeneous endometrium measuring 34 mm. This  likely represents retained clot/debris, although cannot completely  exclude retained products of conception.  Electronically Signed  By: Elberta Fortis M.D.  On: 03/27/2014 01:14   US Ob Comp Less 14 Wks  03/26/2014   CLINICAL DATA:  Abdominal pain and vaginal bleeding. LMP per history 11 weeks and 3 days.  EXAM: OBSTETRIC <14 WK ULTRASOUND  TECHNIQUE: Transabdominal ultrasound was performed for evaluation of the gestation as well as the maternal uterus and adnexal regions.  COMPARISON:  None.  FINDINGS: Intrauterine gestational sac: Visualized/normal in shape and single.  Yolk sac:  Visualized.  Embryo:  Visualized.  Cardiac Activity: Visualized.  Heart Rate: 170 bpm  CRL:   5.36  cm   12 w 1 d                  [place] EDC: 10/07/2014  Suggestion of a thin curvilinear subchorionic hemorrhage measuring approximately 1.1 x 6.3 x 8.9 cm.  Maternal uterus/adnexae: Right ovary within normal. Left ovary not visualized. No free pelvic  fluid.  IMPRESSION: Single live IUP with estimated gestational age [redacted] weeks 1 day. Subchorionic hemorrhage present.   Electronically Signed   By: Elberta Fortis M.D.   On: 03/26/2014 01:02   Management in MAU: Consult Dr Erin Fulling.  Plan to D/C home with bleeding precautions and Rx for Cytotec. Pt bleeding increased, soaking 1  pad in 30 minutes.   Cytotec 800 mcg placed vaginally in MAU.  Pt observed for 3 hours.  Bleeding reduced, with 1/4 pad in 1 hour.  CBC repeated.  Hgb 10.1 on 4/6, then 11.1 later on 4/6, and down to 8.9 following bleeding.  VS stable, pt asymptomatic with blood loss.  ASSESSMENT 1. Miscarriage     PLAN  D/C home with bleeding precautions [facility]--message sent  Return to MAU as needed for emergencies    Medication List    ASK your doctor about these medications       ondansetron 8 MG disintegrating tablet  Commonly known as:  ZOFRAN ODT  8mg  ODT q4 hours prn nausea     PRENATAL COMPLETE 14-0.4 MG Tabs  Take 1 tablet by mouth daily.         Sharen CounterLisa Leftwich-Kirby Certified Nurse-Midwife 03/27/2014  4:55 AM

## 2014-03-26 NOTE — MAU Note (Signed)
Pt's abdomen is tender with palpation;

## 2014-03-26 NOTE — MAU Note (Signed)
Patient arrived via EMS with complaints of lower abdominal pain and vaginal pain. States she feels like she is in labor. The pain is same as earlier today and yesterday but more intense

## 2014-03-26 NOTE — Discharge Instructions (Signed)
Vaginal Bleeding During Pregnancy, First Trimester A small amount of bleeding (spotting) from the vagina is relatively common in early pregnancy. It usually stops on its own. Various things may cause bleeding or spotting in early pregnancy. Some bleeding may be related to the pregnancy, and some may not. In most cases, the bleeding is normal and is not a problem. However, bleeding can also be a sign of something serious. Be sure to tell your health care provider about any vaginal bleeding right away. Some possible causes of vaginal bleeding during the first trimester include:  Infection or inflammation of the cervix.  Growths (polyps) on the cervix.  Miscarriage or threatened miscarriage.  Pregnancy tissue has developed outside of the uterus and in a fallopian tube (tubal pregnancy).  Tiny cysts have developed in the uterus instead of pregnancy tissue (molar pregnancy). HOME CARE INSTRUCTIONS  Watch your condition for any changes. The following actions may help to lessen any discomfort you are feeling:  Follow your health care provider's instructions for limiting your activity. If your health care provider orders bed rest, you may need to stay in bed and only get up to use the bathroom. However, your health care provider may allow you to continue light activity.  If needed, make plans for someone to help with your regular activities and responsibilities while you are on bed rest.  Keep track of the number of pads you use each day, how often you change pads, and how soaked (saturated) they are. Write this down.  Do not use tampons. Do not douche.  Do not have sexual intercourse or orgasms until approved by your health care provider.  If you pass any tissue from your vagina, save the tissue so you can show it to your health care provider.  Only take over-the-counter or prescription medicines as directed by your health care provider.  Do not take aspirin because it can make you  bleed.  Keep all follow-up appointments as directed by your health care provider. SEEK MEDICAL CARE IF:  You have any vaginal bleeding during any part of your pregnancy.  You have cramps or labor pains. SEEK IMMEDIATE MEDICAL CARE IF:   You have severe cramps in your back or belly (abdomen).  You have a fever, not controlled by medicine.  You pass large clots or tissue from your vagina.  Your bleeding increases.  You feel lightheaded or weak, or you have fainting episodes.  You have chills.  You are leaking fluid or have a gush of fluid from your vagina.  You pass out while having a bowel movement. MAKE SURE YOU:  Understand these instructions.  Will watch your condition.  Will get help right away if you are not doing well or get worse. Document Released: 09/16/2005 Document Revised: 09/27/2013 Document Reviewed: 08/14/2013 [facility] Patient Information 2014 [place], [place]. Prenatal Care [facility]    [facility]  [phone]     Phone: [phone]          [facility]                      [facility] [place]  @[place]     Phone: [phone]  Phone: [phone]         [facility]     Phone: [phone]  Phone: [phone]           [facility] [place]                hone: [phone]  Phone: [phone]         [facility] Dr. Francoise Ceo      Phone: [phone]  Phone: [phone]         [facility]                Phone: [phone]  [facility]   [address] [place])          Phone: [phone] [facility]   Phone: [phone]

## 2014-03-26 NOTE — MAU Provider Note (Signed)
Attestation of Attending Supervision of Advanced Practitioner (CNM/NP): Evaluation and management procedures were performed by the Advanced Practitioner under my supervision and collaboration.  I have reviewed the Advanced Practitioner's note and chart, and I agree with the management and plan.  HARRAWAY-SMITH, Hesper Venturella 6:13 PM     

## 2014-03-26 NOTE — MAU Note (Signed)
Heavy bright red vaginal bleeding x 7 hours, no clots. Lower abdominal pain started "recently". Denies any other symptoms. No prenatal care yet.

## 2014-03-26 NOTE — MAU Note (Signed)
Patient states she was seen in MAU early in the am. States she continues to have a little bleeding but is having more abdominal and back pain. Nausea and vomiting.

## 2014-03-26 NOTE — MAU Provider Note (Signed)
Chief Complaint: Vaginal Bleeding and Abdominal Pain   First Provider Initiated Contact with Patient 03/26/14 0106     SUBJECTIVE HPI: Krystal Murray is a 23 y.o. [id number] at [redacted]w[redacted]d by [place] today who presents with vaginal bleeding and lower abdominal constant cramping pain. Pain has been ongoing all day. Wants pain med. Bleeding BRB, changing pad every 1-2 hr. No dysuria, urgency, hematuria. Denies constipation or diarrhea. Has been on Zofran for N/V of pregnancy. Had neg WP, G/C on 02/28/14.  Past Medical History  Diagnosis Date  . No pertinent past medical history    OB History  Gravida Para Term Preterm AB SAB TAB Ectopic Multiple Living  4 2 2  0 1 0 1 0 0 2    # Outcome Date GA Lbr Len/2nd Weight Sex Delivery Anes PTL Lv  4 CUR           3 TRM 09/02/12 [redacted]w[redacted]d 239:07 / 00:05 2.735 kg (6 lb 0.5 oz) F SVD EPI  Y  2 TAB 2012          1 TRM 2010 [redacted]w[redacted]d 27:00 3.771 kg (8 lb 5 oz) F SVD EPI  Y     Past Surgical History  Procedure Laterality Date  . No past surgeries     History   Social History  . Marital Status: Single    Spouse Name: N/A    Number of Children: N/A  . Years of Education: N/A   Occupational History  . Not on file.   Social History Main Topics  . Smoking status: Former Smoker    Quit date: 08/29/2011  . Smokeless tobacco: Never Used  . Alcohol Use: No  . Drug Use: No  . Sexual Activity: Yes     Comment: last sex two weeks ago   Other Topics Concern  . Not on file   Social History Narrative  . No narrative on file   No current facility-administered medications on file prior to encounter.   Current Outpatient Prescriptions on File Prior to Encounter  Medication Sig Dispense Refill  . ondansetron (ZOFRAN ODT) 8 MG disintegrating tablet 8mg  ODT q4 hours prn nausea  20 tablet  0  . Prenatal Vit-Fe Fumarate-FA (PRENATAL COMPLETE) 14-0.4 MG TABS Take 1 tablet by mouth daily.  60 each  0   Allergies  Allergen Reactions  . Penicillins Other (See Comments)     Unknown, childhood allergy    ROS: Pertinent items in HPI  OBJECTIVE Blood pressure 117/63, pulse 80, temperature 98.8 F (37.1 C), temperature source Oral, resp. rate 18, height 5\' 5"  (1.651 m), weight 72.122 kg (159 lb), last menstrual period 01/05/2014, SpO2 100.00%. GENERAL: Well-developed, well-nourished female in no acute distress.  HEENT: Normocephalic HEART: normal rate RESP: normal effort ABDOMEN: Soft, tender to touch suprapubic; voluntary guarding, no rebound; fundus 1/3 to U EXTREMITIES: Nontender, no edema NEURO: Alert and oriented Peri-pad> small amount brown dry blood  LAB RESULTS Results for orders placed during the hospital encounter of 03/26/14 (from the past 24 hour(s))  CBC     Status: Abnormal   Collection Time    03/26/14 12:16 AM      Result Value Ref Range   WBC 7.5  4.0 - 10.5 K/uL   RBC 3.77 (*) 3.87 - 5.11 MIL/uL   Hemoglobin 10.1 (*) 12.0 - 15.0 g/dL   HCT 45.4 (*) 09.8 - 11.9 %   MCV 83.0  78.0 - 100.0 fL   MCH 26.8  26.0 - 34.0  pg   MCHC 32.3  30.0 - 36.0 g/dL   RDW 16.114.9  09.611.5 - 04.515.5 %   Platelets 185  150 - 400 K/uL  RH IG WORKUP (INCLUDES ABO/RH)     Status: None   Collection Time    03/26/14 12:16 AM      Result Value Ref Range   Gestational Age(Wks) 11     ABO/RH(D) A NEG     Antibody Screen NEG     Unit Number [id number]     Blood Component Type RHIG     Unit division 00     Status of Unit ALLOCATED     Transfusion Status OK TO TRANSFUSE      IMAGING [place] Ob Comp Less 14 Wks  03/26/2014   CLINICAL DATA:  Abdominal pain and vaginal bleeding. LMP per history 11 weeks and 3 days.  EXAM: OBSTETRIC <14 WK ULTRASOUND  TECHNIQUE: Transabdominal ultrasound was performed for evaluation of the gestation as well as the maternal uterus and adnexal regions.  COMPARISON:  None.  FINDINGS: Intrauterine gestational sac: Visualized/normal in shape and single.  Yolk sac:  Visualized.  Embryo:  Visualized.  Cardiac Activity: Visualized.  Heart Rate:  170 bpm  CRL:   5.36  cm   12 w 1 d                  US EDC: 10/07/2014  Suggestion of a thin curvilinear subchorionic hemorrhage measuring approximately 1.1 x 6.3 x 8.9 cm.  Maternal uterus/adnexae: Right ovary within normal. Left ovary not visualized. No free pelvic fluid.  IMPRESSION: Single live IUP with estimated gestational age [redacted] weeks 1 day. Subchorionic hemorrhage present.   Electronically Signed   By: Elberta Fortisaniel  Boyle M.D.   On: 03/26/2014 01:02    MAU COURSE RhoGam 300 mcg IM given [facility]  ASSESSMENT 1. Bleeding in early pregnancy   2. Rh negative state in antepartum period   331w1d [facility]  PLAN Discharge home Pregnancy verification letter and list of providers given.    Medication List         ondansetron 8 MG disintegrating tablet  Commonly known as:  ZOFRAN ODT  8mg  ODT q4 hours prn nausea     PRENATAL COMPLETE 14-0.4 MG Tabs  Take 1 tablet by mouth daily.      Tylenol prn pain  Follow-up Information   [facility] [place] (See list of prenatal providers)    Contact information:   [address] [place] [id number] [phone]       Danae OrleansDeirdre C Poe, CNM 03/26/2014  1:08 AM

## 2014-03-27 ENCOUNTER — Inpatient Hospital Stay (HOSPITAL_COMMUNITY): Payer: Medicaid Other

## 2014-03-27 DIAGNOSIS — O039 Complete or unspecified spontaneous abortion without complication: Secondary | ICD-10-CM

## 2014-03-27 LAB — RH IG WORKUP (INCLUDES ABO/RH)
ABO/RH(D): A NEG
ANTIBODY SCREEN: NEGATIVE
GESTATIONAL AGE(WKS): 11
Unit division: 0

## 2014-03-27 LAB — CBC
HCT: 27.4 % — ABNORMAL LOW (ref 36.0–46.0)
HEMATOCRIT: 33.7 % — AB (ref 36.0–46.0)
HEMOGLOBIN: 8.9 g/dL — AB (ref 12.0–15.0)
Hemoglobin: 11.1 g/dL — ABNORMAL LOW (ref 12.0–15.0)
MCH: 27 pg (ref 26.0–34.0)
MCH: 27.4 pg (ref 26.0–34.0)
MCHC: 32.5 g/dL (ref 30.0–36.0)
MCHC: 32.9 g/dL (ref 30.0–36.0)
MCV: 83 fL (ref 78.0–100.0)
MCV: 83.2 fL (ref 78.0–100.0)
Platelets: 176 10*3/uL (ref 150–400)
Platelets: 183 10*3/uL (ref 150–400)
RBC: 3.3 MIL/uL — ABNORMAL LOW (ref 3.87–5.11)
RBC: 4.05 MIL/uL (ref 3.87–5.11)
RDW: 14.7 % (ref 11.5–15.5)
RDW: 14.9 % (ref 11.5–15.5)
WBC: 10.5 10*3/uL (ref 4.0–10.5)
WBC: 8.3 10*3/uL (ref 4.0–10.5)

## 2014-03-27 LAB — GC/CHLAMYDIA PROBE AMP
CT PROBE, AMP APTIMA: NEGATIVE
GC PROBE AMP APTIMA: NEGATIVE

## 2014-03-27 MED ORDER — MISOPROSTOL 200 MCG PO TABS
800.0000 ug | ORAL_TABLET | Freq: Once | ORAL | Status: AC
  Administered 2014-03-27: 800 ug via VAGINAL
  Filled 2014-03-27: qty 4

## 2014-03-27 MED ORDER — OXYCODONE-ACETAMINOPHEN 5-325 MG PO TABS
2.0000 | ORAL_TABLET | Freq: Once | ORAL | Status: AC
Start: 2014-03-27 — End: 2014-03-27
  Administered 2014-03-27: 2 via ORAL
  Filled 2014-03-27: qty 2

## 2014-03-27 MED ORDER — OXYCODONE-ACETAMINOPHEN 5-325 MG PO TABS: 1.0000 | ORAL_TABLET | Freq: Four times a day (QID) | ORAL | Status: DC | PRN

## 2014-03-27 MED ORDER — HYDROMORPHONE HCL PF 1 MG/ML IJ SOLN
1.0000 mg | INTRAMUSCULAR | Status: AC
  Administered 2014-03-27: 1 mg via INTRAVENOUS
  Filled 2014-03-27: qty 1

## 2014-03-27 NOTE — MAU Provider Note (Signed)
Attestation of Attending Supervision of Advanced Practitioner (CNM/NP): Evaluation and management procedures were performed by the Advanced Practitioner under my supervision and collaboration.  I have reviewed the Advanced Practitioner's note and chart, and I agree with the management and plan.  HARRAWAY-SMITH, Earnest Thalman 7:13 AM     

## 2014-03-27 NOTE — MAU Note (Signed)
Sharen CounterLisa Leftwich Kirby CNM notified while patient was using the bedpan she passed what appears to be a 12 week fetus and multiple blood clots.

## 2014-03-27 NOTE — MAU Note (Signed)
Patient desires to take 12 week fetus home. Discussed with provider and [facility]alyn Sh[facility]Brenda Erd[facility].  Proper paperwork signed by patient.

## 2014-03-27 NOTE — MAU Note (Signed)
Patient able to ambulate in the room without any difficulty

## 2014-03-27 NOTE — Discharge Instructions (Signed)
Miscarriage A miscarriage is the sudden loss of an unborn baby (fetus) before the 20th week of pregnancy. Most miscarriages happen in the first 3 months of pregnancy. Sometimes, it happens before a woman even knows she is pregnant. A miscarriage is also called a "spontaneous miscarriage" or "early pregnancy loss." Having a miscarriage can be an emotional experience. Talk with your caregiver about any questions you may have about miscarrying, the grieving process, and your future pregnancy plans. CAUSES   Problems with the fetal chromosomes that make it impossible for the baby to develop normally. Problems with the baby's genes or chromosomes are most often the result of errors that occur, by chance, as the embryo divides and grows. The problems are not inherited from the parents.  Infection of the cervix or uterus.   Hormone problems.   Problems with the cervix, such as having an incompetent cervix. This is when the tissue in the cervix is not strong enough to hold the pregnancy.   Problems with the uterus, such as an abnormally shaped uterus, uterine fibroids, or congenital abnormalities.   Certain medical conditions.   Smoking, drinking alcohol, or taking illegal drugs.   Trauma.  Often, the cause of a miscarriage is unknown.  SYMPTOMS   Vaginal bleeding or spotting, with or without cramps or pain.  Pain or cramping in the abdomen or lower back.  Passing fluid, tissue, or blood clots from the vagina. DIAGNOSIS  Your caregiver will perform a physical exam. You may also have an ultrasound to confirm the miscarriage. Blood or urine tests may also be ordered. TREATMENT   Sometimes, treatment is not necessary if you naturally pass all the fetal tissue that was in the uterus. If some of the fetus or placenta remains in the body (incomplete miscarriage), tissue left behind may become infected and must be removed. Usually, a dilation and curettage (D and C) procedure is performed.  During a D and C procedure, the cervix is widened (dilated) and any remaining fetal or placental tissue is gently removed from the uterus.  Antibiotic medicines are prescribed if there is an infection. Other medicines may be given to reduce the size of the uterus (contract) if there is a lot of bleeding.  If you have Rh negative blood and your baby was Rh positive, you will need a Rh immunoglobulin shot. This shot will protect any future baby from having Rh blood problems in future pregnancies. HOME CARE INSTRUCTIONS   Your caregiver may order bed rest or may allow you to continue light activity. Resume activity as directed by your caregiver.  Have someone help with home and family responsibilities during this time.   Keep track of the number of sanitary pads you use each day and how soaked (saturated) they are. Write down this information.   Do not use tampons. Do not douche or have sexual intercourse until approved by your caregiver.   Only take over-the-counter or prescription medicines for pain or discomfort as directed by your caregiver.   Do not take aspirin. Aspirin can cause bleeding.   Keep all follow-up appointments with your caregiver.   If you or your partner have problems with grieving, talk to your caregiver or seek counseling to help cope with the pregnancy loss. Allow enough time to grieve before trying to get pregnant again.  SEEK IMMEDIATE MEDICAL CARE IF:   You have severe cramps or pain in your back or abdomen.  You have a fever.  You pass large blood clots (walnut-sized   or larger) ortissue from your vagina. Save any tissue for your caregiver to inspect.   Your bleeding increases.   You have a thick, bad-smelling vaginal discharge.  You become lightheaded, weak, or you faint.   You have chills.  MAKE SURE YOU:  Understand these instructions.  Will watch your condition.  Will get help right away if you are not doing well or get  worse. Document Released: 06/02/2001 Document Revised: 04/03/2013 Document Reviewed: 01/26/2012 Exit[employer] Patient Information 2014 [employer].  

## 2014-03-27 NOTE — MAU Note (Deleted)
Sharen CounterLisa Leftwich Kirby CNM notified while patient using the bedpan she passed what appears to be a 12 week fetus and multiple blood clots.

## 2014-03-27 NOTE — MAU Note (Signed)
Cytotec 800 mcg vaginally placed per Charlyne MomLisa Leftich Kirby CNM

## 2014-03-30 NOTE — MAU Provider Note (Signed)
Attestation of Attending Supervision of Advanced Practitioner: Evaluation and management procedures were performed by the PA/NP/CNM/OB Fellow under my supervision/collaboration. Chart reviewed and agree with management and plan.  Tilda BurrowJohn V Adalyn Pennock 03/30/2014 7:31 AM

## 2014-04-03 ENCOUNTER — Emergency Department (HOSPITAL_COMMUNITY): Payer: Medicaid Other

## 2014-04-03 ENCOUNTER — Inpatient Hospital Stay (HOSPITAL_COMMUNITY)
Admission: EM | Admit: 2014-04-03 | Discharge: 2014-04-04 | Disposition: A | Discharge status: Home / Self Care | Payer: Medicaid Other | Attending: Obstetrics & Gynecology | Admitting: Obstetrics & Gynecology

## 2014-04-03 ENCOUNTER — Encounter (HOSPITAL_COMMUNITY): Payer: Self-pay | Admitting: Emergency Medicine

## 2014-04-03 DIAGNOSIS — N898 Other specified noninflammatory disorders of vagina: Secondary | ICD-10-CM | POA: Insufficient documentation

## 2014-04-03 DIAGNOSIS — Z87891 Personal history of nicotine dependence: Secondary | ICD-10-CM | POA: Insufficient documentation

## 2014-04-03 DIAGNOSIS — R109 Unspecified abdominal pain: Secondary | ICD-10-CM

## 2014-04-03 DIAGNOSIS — N939 Abnormal uterine and vaginal bleeding, unspecified: Secondary | ICD-10-CM

## 2014-04-03 DIAGNOSIS — O039 Complete or unspecified spontaneous abortion without complication: Secondary | ICD-10-CM

## 2014-04-03 DIAGNOSIS — D72829 Elevated white blood cell count, unspecified: Secondary | ICD-10-CM | POA: Insufficient documentation

## 2014-04-03 DIAGNOSIS — O209 Hemorrhage in early pregnancy, unspecified: Secondary | ICD-10-CM | POA: Insufficient documentation

## 2014-04-03 LAB — CBC WITH DIFFERENTIAL/PLATELET
Basophils Absolute: 0 10*3/uL (ref 0.0–0.1)
Basophils Relative: 0 % (ref 0–1)
EOS PCT: 1 % (ref 0–5)
Eosinophils Absolute: 0.1 10*3/uL (ref 0.0–0.7)
HCT: 23 % — ABNORMAL LOW (ref 36.0–46.0)
HEMOGLOBIN: 7.5 g/dL — AB (ref 12.0–15.0)
LYMPHS ABS: 2.7 10*3/uL (ref 0.7–4.0)
LYMPHS PCT: 20 % (ref 12–46)
MCH: 27.2 pg (ref 26.0–34.0)
MCHC: 32.6 g/dL (ref 30.0–36.0)
MCV: 83.3 fL (ref 78.0–100.0)
MONOS PCT: 9 % (ref 3–12)
Monocytes Absolute: 1.2 10*3/uL — ABNORMAL HIGH (ref 0.1–1.0)
NEUTROS ABS: 9.1 10*3/uL — AB (ref 1.7–7.7)
Neutrophils Relative %: 70 % (ref 43–77)
Platelets: 270 10*3/uL (ref 150–400)
RBC: 2.76 MIL/uL — ABNORMAL LOW (ref 3.87–5.11)
RDW: 14.7 % (ref 11.5–15.5)
WBC: 13 10*3/uL — AB (ref 4.0–10.5)

## 2014-04-03 MED ORDER — MORPHINE SULFATE 4 MG/ML IJ SOLN
4.0000 mg | Freq: Once | INTRAMUSCULAR | Status: AC
  Administered 2014-04-03: 4 mg via INTRAVENOUS
  Filled 2014-04-03: qty 1

## 2014-04-03 NOTE — ED Notes (Signed)
Pt very tearful in waiting area. Provided with pad. Pt updated on wait time.

## 2014-04-03 NOTE — ED Provider Notes (Signed)
CSN: [id number]     Arrival date & time 04/03/14  2201 History   First MD Initiated Contact with Patient 04/03/14 2302     Chief Complaint  Patient presents with  . Vaginal Bleeding  . Abdominal Cramping   (Consider location/radiation/quality/duration/timing/severity/associated sxs/prior Treatment) The history is provided by the patient and medical records.   This is a 23 year old female, G4P2 s/p miscarriage at [redacted] weeks gestation 1 week ago, presenting to the ED for persistent lower abdominal cramping and vaginal bleeding. She was seen at a [facility] 3x on 03/26/14 for vaginal bleeding. Pt was initially dx with small [facility] and given rhogam but due to persistent pain she returned twice more, ultimately resulting in miscarriage 54 week old fetus.  Fetus was delivered in bedpan and sent to pathology.  She had a ultrasound after delivery which showed possible retained POC.  Pt was given 1 unit of PRBC's, vaginal cytotec placed and additional rhogam.  She was discharged home with prescription for cytotec and percocet and instructed to FU in 2 weeks for re re-check.  Since returning home she has continued to have severe lower abdominal cramping and vaginal bleeding. States earlier this afternoon she began passing large clots.  She states she had not filled any of her discharge prescriptions.  She denies any fevers or chills. Denies any lightheadedness or dizziness.  VS stable on arrival.  Past Medical History  Diagnosis Date  . No pertinent past medical history   . Medical history non-contributory    Past Surgical History  Procedure Laterality Date  . No past surgeries     Family History  Problem Relation Age of Onset  . Cancer Maternal Aunt     uterine  . Diabetes Maternal Aunt   . Hypertension Maternal Aunt   . Cancer Maternal Grandmother     breastt  . Cancer Cousin     leaukemia   History  Substance Use Topics  . Smoking status: Former Smoker    Quit date: 08/29/2011  .  Smokeless tobacco: Never Used  . Alcohol Use: No   OB History   Grav Para Term Preterm Abortions TAB SAB Ect Mult Living   4 2 2  0 1 1 0 0 0 2     Review of Systems  Gastrointestinal:       Abdominal cramping  Genitourinary: Positive for vaginal bleeding and pelvic pain.  All other systems reviewed and are negative.     Allergies  Penicillins  Home Medications   Prior to Admission medications   Medication Sig Start Date End Date Taking? Authorizing Provider  ondansetron (ZOFRAN ODT) 8 MG disintegrating tablet 8mg  ODT q4 hours prn nausea 03/01/14   Phill Mutter Dammen, PA-C  oxyCODONE-acetaminophen (PERCOCET/ROXICET) 5-325 MG per tablet Take 1-2 tablets by mouth every 6 (six) hours as needed. 03/27/14   Wilmer Floor Leftwich-Kirby, CNM  Prenatal Vit-Fe Fumarate-FA (PRENATAL COMPLETE) 14-0.4 MG TABS Take 1 tablet by mouth daily. 03/01/14   Phill Mutter Dammen, PA-C   BP 115/81  Pulse 96  Temp(Src) 98.8 F (37.1 C) (Oral)  Resp 20  Ht 5\' 5"  (1.651 m)  Wt 157 lb 1 oz (71.243 kg)  BMI 26.14 kg/m2  SpO2 100%  LMP 01/05/2014  Breastfeeding? No  Physical Exam  Nursing note and vitals reviewed. Constitutional: She is oriented to person, place, and time. She appears well-developed and well-nourished.  HENT:  Head: Normocephalic and atraumatic.  Mouth/Throat: Oropharynx is clear and moist.  Eyes: Conjunctivae and EOM  are normal. Pupils are equal, round, and reactive to light.  Neck: Normal range of motion.  Cardiovascular: Normal rate, regular rhythm and normal heart sounds.   Pulmonary/Chest: Effort normal and breath sounds normal. No respiratory distress. She has no wheezes.  Abdominal: Soft. Bowel sounds are normal. There is tenderness in the right lower quadrant and left lower quadrant. There is no guarding and no CVA tenderness.  Genitourinary: There is no lesion on the right labia. There is no lesion on the left labia. There is bleeding around the vagina.  Normal external genitalia without  visible lesions; large amount of vaginal bleeding with clots present, cervix was not visualized as patient was unable to tolerate procedure  Musculoskeletal: Normal range of motion.  Neurological: She is alert and oriented to person, place, and time.  Skin: Skin is warm and dry.  Psychiatric: She has a normal mood and affect.    ED Course  Procedures (including critical care time) Labs Review Labs Reviewed  CBC WITH DIFFERENTIAL - Abnormal; Notable for the following:    WBC 13.0 (*)    RBC 2.76 (*)    Hemoglobin 7.5 (*)    HCT 23.0 (*)    Neutro Abs 9.1 (*)    Monocytes Absolute 1.2 (*)    All other components within normal limits  HCG, QUANTITATIVE, PREGNANCY - Abnormal; Notable for the following:    hCG, Beta Chain, Quant, S [id number] (*)    All other components within normal limits  URINALYSIS, ROUTINE W REFLEX MICROSCOPIC  ABO/RH    Imaging Review [place] Ob Transvaginal  04/04/2014   CLINICAL DATA:  Vaginal bleeding  EXAM: TRANSVAGINAL OB ULTRASOUND  TECHNIQUE: Transvaginal ultrasound was performed for complete evaluation of the gestation as well as the maternal uterus, adnexal regions, and pelvic cul-de-sac.  COMPARISON:  None.  FINDINGS: Intrauterine gestational sac: None.  Yolk sac:  None.  Embryo:  None.  Cardiac Activity: None.  Maternal uterus/adnexae: There is a small amount of debris present within the endometrial cavity. The examination had to be prematurely terminated secondary to severe pelvic pain. The ovaries are not visualized.  IMPRESSION: No intrauterine pregnancy is identified. There is debris present within the endometrial cavity likely representing blood products. The examination is limited as the patient was unable to tolerate the entire exam secondary to pain.   Electronically Signed   By: Elige KoHetal  Patel   On: 04/04/2014 00:13     EKG Interpretation None      MDM   Final diagnoses:  Vaginal bleeding  Abdominal cramping   22 y.o. F s/p miscarriage 1 week ago at  MAU presenting to the ED for persistent abdominal cramping and vaginal bleeding. Earlier today she began passing large clots.  Notes, labs, and u/s from prior MAU visits were reviewed.  Today pt has leukocytosis of 13.0, hemoglobin is down 4 g from 1 week ago despite transfusion prior to discharge.  On repeat ultrasound, she has what looks like blood products, however retained products of conception cannot be excluded as patient was unable to tolerate entire exam. Attempted pelvic exam, large amount of blood and clots present, however patient was also unable to tolerate full procedure due to pain.  Cervical os was not visualized, wet prep and gc/chl not obtained. Case was discussed with on call physician at [facility], Dr. Penne LashLeggett, she has agreed to accept pt in transfer.  EMTALA completed.  Pt will be transferred via [facility].  Garlon HatchetLisa M Bart Ashford, PA-C 04/04/14 David [id number]  Misty StanleyLisa  Sherrill RaringM Tristram Milian, PA-C 04/04/14 0221

## 2014-04-03 NOTE — ED Notes (Signed)
Pt reports lower abdominal cramping and vaginal bleeding since miscarriage on 03/26/2014; pt was appx [redacted] weeks along. Pt seen at [facility] 4/[facility] but unable to make follow- up appt. Denies F/C. Denies dizziness, lightheadedness.

## 2014-04-04 ENCOUNTER — Encounter (HOSPITAL_COMMUNITY): Payer: Self-pay | Admitting: *Deleted

## 2014-04-04 DIAGNOSIS — N898 Other specified noninflammatory disorders of vagina: Secondary | ICD-10-CM

## 2014-04-04 LAB — ABO/RH
ABO/RH(D): A NEG
ANTIBODY SCREEN: POSITIVE
DAT, IgG: NEGATIVE

## 2014-04-04 LAB — HCG, QUANTITATIVE, PREGNANCY: HCG, BETA CHAIN, QUANT, S: 12189 m[IU]/mL — AB (ref <5)

## 2014-04-04 MED ORDER — MISOPROSTOL 200 MCG PO TABS
800.0000 ug | ORAL_TABLET | Freq: Once | ORAL | Status: AC
  Administered 2014-04-04: 800 ug via VAGINAL
  Filled 2014-04-04: qty 4

## 2014-04-04 MED ORDER — HYDROMORPHONE HCL PF 1 MG/ML IJ SOLN
1.0000 mg | Freq: Once | INTRAMUSCULAR | Status: AC
  Administered 2014-04-04: 1 mg via INTRAVENOUS
  Filled 2014-04-04: qty 1

## 2014-04-04 MED ORDER — OXYCODONE-ACETAMINOPHEN 5-325 MG PO TABS: 1.0000 | ORAL_TABLET | Freq: Four times a day (QID) | ORAL | Status: DC | PRN

## 2014-04-04 MED ORDER — SODIUM CHLORIDE 0.9 % IV BOLUS (SEPSIS)
1000.0000 mL | Freq: Once | INTRAVENOUS | Status: AC
  Administered 2014-04-04: 1000 mL via INTRAVENOUS

## 2014-04-04 MED ORDER — DOXYCYCLINE HYCLATE 100 MG PO CAPS: 100.0000 mg | ORAL_CAPSULE | Freq: Two times a day (BID) | ORAL | Status: DC

## 2014-04-04 NOTE — ED Notes (Signed)
Per PA Misty StanleyLisa, do not complete in and out at this time.

## 2014-04-04 NOTE — ED Provider Notes (Signed)
Medical screening examination/treatment/procedure(s) were performed by non-physician practitioner and as supervising physician I was immediately available for consultation/collaboration.    Gamal Todisco D Marijke Guadiana, MD 04/04/14 0656 

## 2014-04-04 NOTE — MAU Provider Note (Signed)
History     CSN: [id number]  Arrival date and time: 04/03/14 2201   First Provider Initiated Contact with Patient 04/04/14 0203      Chief Complaint  Patient presents with  . Vaginal Bleeding  . Abdominal Cramping   HPI Krystal Murray is a 23 y.o. [phone] who is s/p SAB at 12 weeks on 03/26/14 who [facility]. The patient passed the fetus in MAU and was given vaginal Cytotec here after delivery. US after delivery showed moderate thickening of the endometrium. Bleeding improved and the patient was given Percocet Rx for pain. While in MAU on 03/26/14 Hgb went from 10.1 to 8.9 prior to discharge. Today CBC from [facility] shows Hgb 7.5. She states that she had light bleeding and minimal cramping since then until yesterday. She states that she started bleeding heavily with large clots and having severe lower abdominal pain. She also endorses nausea without vomiting. [facility]. She was given 4 mg Morphine there and states that this helped her pain somewhat, but it is starting to come back now upon arrival in MAU. The patient rates her pain at 9/10 now. She denies fever. She is having mild dizziness and weakness but denies SOB or LOC.   OB History   Grav Para Term Preterm Abortions TAB SAB Ect Mult Living   4 2 2  0 1 1 0 0 0 2      Past Medical History  Diagnosis Date  . No pertinent past medical history   . Medical history non-contributory     Past Surgical History  Procedure Laterality Date  . No past surgeries      Family History  Problem Relation Age of Onset  . Cancer Maternal Aunt     uterine  . Diabetes Maternal Aunt   . Hypertension Maternal Aunt   . Cancer Maternal Grandmother     breastt  . Cancer Cousin     leaukemia    History  Substance Use Topics  . Smoking status: Former Smoker    Quit date: 08/29/2011  . Smokeless tobacco: Never Used  . Alcohol Use: No    Allergies:  Allergies  Allergen Reactions  .  Penicillins Other (See Comments)    Unknown, childhood allergy    Prescriptions prior to admission  Medication Sig Dispense Refill  . ondansetron (ZOFRAN ODT) 8 MG disintegrating tablet 8mg  ODT q4 hours prn nausea  20 tablet  0  . Prenatal Vit-Fe Fumarate-FA (PRENATAL COMPLETE) 14-0.4 MG TABS Take 1 tablet by mouth daily.  60 each  0  . [DISCONTINUED] oxyCODONE-acetaminophen (PERCOCET/ROXICET) 5-325 MG per tablet Take 1-2 tablets by mouth every 6 (six) hours as needed.  15 tablet  0    Review of Systems  Constitutional: Negative for fever and malaise/fatigue.  Gastrointestinal: Positive for abdominal pain. Negative for nausea and vomiting.  Genitourinary:       + vaginal bleeding  Neurological: Positive for dizziness and weakness. Negative for loss of consciousness.   Physical Exam   Blood pressure 108/55, pulse 74, temperature 98.6 F (37 C), temperature source Oral, resp. rate 18, height 5\' 5"  (1.651 m), weight 71.243 kg (157 lb 1 oz), last menstrual period 01/05/2014, SpO2 100.00%, unknown if currently breastfeeding.  Physical Exam  Constitutional: She is oriented to person, place, and time. She appears well-developed and well-nourished. No distress.  HENT:  Head: Normocephalic and atraumatic.  Cardiovascular: Normal rate.   Respiratory: Effort normal.  GI: Soft. She exhibits no distension and no mass. There is tenderness (mild tenderness to palpation of the lower abdomen). There is no rebound and no guarding.  Genitourinary: Uterus is tender (mild). Uterus is not enlarged. Cervix exhibits no motion tenderness, no discharge and no friability. Right adnexum displays no mass and no tenderness. Left adnexum displays no mass and no tenderness. There is bleeding (small amount of blood in the vagina) around the vagina. No vaginal discharge found.  Neurological: She is alert and oriented to person, place, and time.  Skin: Skin is warm and dry. No erythema.  Psychiatric: She has a normal  mood and affect.   Results for orders placed during the hospital encounter of 04/03/14 (from the past 24 hour(s))  HCG, QUANTITATIVE, PREGNANCY     Status: Abnormal   Collection Time    04/03/14 11:20 PM      Result Value Ref Range   hCG, Beta Chain, Quant, S 12189 (*) <5 mIU/mL  CBC WITH DIFFERENTIAL     Status: Abnormal   Collection Time    04/03/14 11:21 PM      Result Value Ref Range   WBC 13.0 (*) 4.0 - 10.5 K/uL   RBC 2.76 (*) 3.87 - 5.11 MIL/uL   Hemoglobin 7.5 (*) 12.0 - 15.0 g/dL   HCT 16.1 (*) 09.6 - 04.5 %   MCV 83.3  78.0 - 100.0 fL   MCH 27.2  26.0 - 34.0 pg   MCHC 32.6  30.0 - 36.0 g/dL   RDW 40.9  81.1 - 91.4 %   Platelets 270  150 - 400 K/uL   Neutrophils Relative % 70  43 - 77 %   Neutro Abs 9.1 (*) 1.7 - 7.7 K/uL   Lymphocytes Relative 20  12 - 46 %   Lymphs Abs 2.7  0.7 - 4.0 K/uL   Monocytes Relative 9  3 - 12 %   Monocytes Absolute 1.2 (*) 0.1 - 1.0 K/uL   Eosinophils Relative 1  0 - 5 %   Eosinophils Absolute 0.1  0.0 - 0.7 K/uL   Basophils Relative 0  0 - 1 %   Basophils Absolute 0.0  0.0 - 0.1 K/uL  ABO/RH     Status: None   Collection Time    04/03/14 11:25 PM      Result Value Ref Range   ABO/RH(D) A NEG     Antibody Identification PASSIVELY ACQUIRED ANTI-D     Antibody Screen POS     DAT, IgG NEG     US Ob Transvaginal  04/04/2014   CLINICAL DATA:  Vaginal bleeding  EXAM: TRANSVAGINAL OB ULTRASOUND  TECHNIQUE: Transvaginal ultrasound was performed for complete evaluation of the gestation as well as the maternal uterus, adnexal regions, and pelvic cul-de-sac.  COMPARISON:  None.  FINDINGS: Intrauterine gestational sac: None.  Yolk sac:  None.  Embryo:  None.  Cardiac Activity: None.  Maternal uterus/adnexae: There is a small amount of debris present within the endometrial cavity. The examination had to be prematurely terminated secondary to severe pelvic pain. The ovaries are not visualized.  IMPRESSION: No intrauterine pregnancy is identified. There  is debris present within the endometrial cavity likely representing blood products. The examination is limited as the patient was unable to tolerate the entire exam secondary to pain.   Electronically Signed   By: Elige Ko   On: 04/04/2014 00:13     MAU Course  Procedures None  MDM 1 mg Dilaudid given  in MAU - patient reports some improvement in pain, but had to be woken up for assessment Discussed patient with Dr. Penne LashLeggett. Ok for discharge with bleeding precautions. Rx for Doxycycline 100 mg BID x 10 days and given 800 mcg Cytotec vaginally in MAU prior to discharge  Assessment and Plan  A: S/P SAB  P: Discharge home Rx for Doxycycline and Percocet given to patient Bleeding precautions and signs/symptoms of infection discussed Patient advised to follow-up with [facility] on 04/11/14 as scheduled Patient may return to MAU as needed or if her condition were to change or worsen  Freddi StarrJulie N Ethier, PA-C  04/04/2014, 3:23 AM

## 2014-04-04 NOTE — Discharge Instructions (Signed)
Miscarriage A miscarriage is the sudden loss of an unborn baby (fetus) before the 20th week of pregnancy. In an incomplete miscarriage, parts of the fetus or placenta (afterbirth) remain in the body.  Having a miscarriage can be an emotional experience. Talk with your health care provider about any questions you may have about miscarrying, the grieving process, and your future pregnancy plans. CAUSES   Problems with the fetal chromosomes that make it impossible for the baby to develop normally. Problems with the baby's genes or chromosomes are most often the result of errors that occur by chance as the embryo divides and grows. The problems are not inherited from the parents.  Infection of the cervix or uterus.  Hormone problems.  Problems with the cervix, such as having an incompetent cervix. This is when the tissue in the cervix is not strong enough to hold the pregnancy.  Problems with the uterus, such as an abnormally shaped uterus, uterine fibroids, or congenital abnormalities.  Certain medical conditions.  Smoking, drinking alcohol, or taking illegal drugs.  Trauma. SYMPTOMS   Vaginal bleeding or spotting, with or without cramps or pain.  Pain or cramping in the abdomen or lower back.  Passing fluid, tissue, or blood clots from the vagina. DIAGNOSIS  Your health care provider will perform a physical exam. You may also have an ultrasound to confirm the miscarriage. Blood or urine tests may also be ordered. TREATMENT   Usually, a dilation and curettage (D&C) procedure is performed. During a D&C procedure, the cervix is widened (dilated) and any remaining fetal or placental tissue is gently removed from the uterus.  Antibiotic medicines are prescribed if there is an infection. Other medicines may be given to reduce the size of the uterus (contract) if there is a lot of bleeding.  If you have Rh negative blood and your baby was Rh positive, you will need an Rho(D) immune  globulin shot. This shot will protect any future baby from having Rh blood problems in future pregnancies.  You may be confined to bed rest. This means you should stay in bed and only get up to use the bathroom. HOME CARE INSTRUCTIONS   Rest as directed by your health care provider.  Restrict activity as directed by your health care provider. You may be allowed to continue light activity if curettage was not done but you require further treatment.  Keep track of the number of pads you use each day. Keep track of how soaked (saturated) they are. Record this information.  Do not  use tampons.  Do not douche or have sexual intercourse until approved by your health care provider.  Keep all follow-up appointments for re-evaluation and continuing management.  Only take over-the-counter or prescription medicines for pain, fever, or discomfort as directed by your health care provider.  Take antibiotic medicine as directed by your health care provider. Make sure you finish it even if you start to feel better. SEEK IMMEDIATE MEDICAL CARE IF:   You experience severe cramps in your stomach, back, or abdomen.  You have an unexplained temperature (make sure to record these temperatures).  You pass large clots or tissue (save these for your health care provider to inspect).  Your bleeding increases.  You become light-headed, weak, or have fainting episodes. MAKE SURE YOU:   Understand these instructions.  Will watch your condition.  Will get help right away if you are not doing well or get worse. Document Released: 12/07/2005 Document Revised: 09/27/2013 Document Reviewed: 07/06/2013 Exit[employer]  Patient Information 2014 [place].

## 2014-04-04 NOTE — ED Notes (Signed)
Patient is Gravida 4 Para 2

## 2014-04-04 NOTE — MAU Note (Signed)
Arrives from [facility]. Pt was seen here on 04/06 and had a miscarriage, pt states her pain has been minimal and the bleeding stopped until today. States she started having severe pain and then started passing large clots.

## 2014-04-04 NOTE — ED Notes (Signed)
BP 89/46. RN started NaCL fluid Bolus.

## 2014-04-04 NOTE — ED Notes (Signed)
PA-C unable to continue with pelvic exam. PA-C, Sharilyn SitesLisa Sanders, unable to obtain specimens for pelvic exam.

## 2014-04-05 NOTE — MAU Provider Note (Signed)
US not consistent with retained POC.  However, if symptoms continue will proceed with D&C.  Probably mild endoenteritis.  Will treat with doxy for 10 days.  If pt develops symptoms of severe anemia then will return to hospital.  Pt should take iron bid.  Attestation of Attending Supervision of Advanced Practitioner (CNM/NP): Evaluation and management procedures were performed by the Advanced Practitioner under my supervision and collaboration. I have reviewed the Advanced Practitioner's note and chart, and I agree with the management and plan.  Fredrich RomansKelly H Taleyah Hillman 6:05 AM

## 2014-04-11 ENCOUNTER — Encounter: Payer: Self-pay | Admitting: Obstetrics & Gynecology

## 2014-09-10 ENCOUNTER — Emergency Department (HOSPITAL_COMMUNITY)
Admission: EM | Admit: 2014-09-10 | Discharge: 2014-09-10 | Discharge status: Absent without leave | Payer: Medicaid Other

## 2014-09-10 ENCOUNTER — Inpatient Hospital Stay (HOSPITAL_COMMUNITY)
Admission: EM | Admit: 2014-09-10 | Discharge: 2014-09-14 | DRG: 759 | Disposition: A | Discharge status: Other Acute Inpatient Hospital | Payer: Medicaid Other | Attending: Family Medicine | Admitting: Family Medicine

## 2014-09-10 ENCOUNTER — Encounter (HOSPITAL_COMMUNITY): Payer: Self-pay | Admitting: Emergency Medicine

## 2014-09-10 DIAGNOSIS — Z87891 Personal history of nicotine dependence: Secondary | ICD-10-CM

## 2014-09-10 DIAGNOSIS — N949 Unspecified condition associated with female genital organs and menstrual cycle: Secondary | ICD-10-CM | POA: Diagnosis present

## 2014-09-10 DIAGNOSIS — Z8249 Family history of ischemic heart disease and other diseases of the circulatory system: Secondary | ICD-10-CM

## 2014-09-10 DIAGNOSIS — Z833 Family history of diabetes mellitus: Secondary | ICD-10-CM

## 2014-09-10 DIAGNOSIS — N730 Acute parametritis and pelvic cellulitis: Secondary | ICD-10-CM

## 2014-09-10 DIAGNOSIS — N7093 Salpingitis and oophoritis, unspecified: Principal | ICD-10-CM | POA: Diagnosis present

## 2014-09-10 DIAGNOSIS — N390 Urinary tract infection, site not specified: Secondary | ICD-10-CM

## 2014-09-10 LAB — URINE MICROSCOPIC-ADD ON

## 2014-09-10 LAB — CBC WITH DIFFERENTIAL/PLATELET
BASOS ABS: 0 10*3/uL (ref 0.0–0.1)
Basophils Relative: 0 % (ref 0–1)
Eosinophils Absolute: 0.2 10*3/uL (ref 0.0–0.7)
Eosinophils Relative: 2 % (ref 0–5)
HCT: 30.3 % — ABNORMAL LOW (ref 36.0–46.0)
Hemoglobin: 8.9 g/dL — ABNORMAL LOW (ref 12.0–15.0)
Lymphocytes Relative: 30 % (ref 12–46)
Lymphs Abs: 2.3 10*3/uL (ref 0.7–4.0)
MCH: 21.2 pg — ABNORMAL LOW (ref 26.0–34.0)
MCHC: 29.4 g/dL — AB (ref 30.0–36.0)
MCV: 72.1 fL — AB (ref 78.0–100.0)
MONOS PCT: 13 % — AB (ref 3–12)
Monocytes Absolute: 1 10*3/uL (ref 0.1–1.0)
Neutro Abs: 4.2 10*3/uL (ref 1.7–7.7)
Neutrophils Relative %: 55 % (ref 43–77)
PLATELETS: 207 10*3/uL (ref 150–400)
RBC: 4.2 MIL/uL (ref 3.87–5.11)
RDW: 18.9 % — AB (ref 11.5–15.5)
WBC: 7.7 10*3/uL (ref 4.0–10.5)

## 2014-09-10 LAB — COMPREHENSIVE METABOLIC PANEL
ALT: 6 U/L (ref 0–35)
AST: 14 U/L (ref 0–37)
Albumin: 3.3 g/dL — ABNORMAL LOW (ref 3.5–5.2)
Alkaline Phosphatase: 43 U/L (ref 39–117)
Anion gap: 11 (ref 5–15)
BUN: 10 mg/dL (ref 6–23)
CHLORIDE: 103 meq/L (ref 96–112)
CO2: 24 mEq/L (ref 19–32)
CREATININE: 0.8 mg/dL (ref 0.50–1.10)
Calcium: 8.9 mg/dL (ref 8.4–10.5)
Glucose, Bld: 84 mg/dL (ref 70–99)
POTASSIUM: 3.7 meq/L (ref 3.7–5.3)
SODIUM: 138 meq/L (ref 137–147)
Total Bilirubin: 0.5 mg/dL (ref 0.3–1.2)
Total Protein: 7.5 g/dL (ref 6.0–8.3)

## 2014-09-10 LAB — URINALYSIS, ROUTINE W REFLEX MICROSCOPIC
Bilirubin Urine: NEGATIVE
Glucose, UA: NEGATIVE mg/dL
Hgb urine dipstick: NEGATIVE
KETONES UR: NEGATIVE mg/dL
NITRITE: POSITIVE — AB
Protein, ur: 30 mg/dL — AB
SPECIFIC GRAVITY, URINE: 1.026 (ref 1.005–1.030)
Urobilinogen, UA: 1 mg/dL (ref 0.0–1.0)
pH: 6.5 (ref 5.0–8.0)

## 2014-09-10 LAB — LIPASE, BLOOD: LIPASE: 12 U/L (ref 11–59)

## 2014-09-10 LAB — PREGNANCY, URINE: Preg Test, Ur: NEGATIVE

## 2014-09-10 MED ORDER — DEXTROSE 5 % IV SOLN
1.0000 g | Freq: Once | INTRAVENOUS | Status: AC
  Administered 2014-09-10: 1 g via INTRAVENOUS
  Filled 2014-09-10: qty 10

## 2014-09-10 MED ORDER — MORPHINE SULFATE 4 MG/ML IJ SOLN
4.0000 mg | Freq: Once | INTRAMUSCULAR | Status: AC
  Administered 2014-09-10: 4 mg via INTRAVENOUS
  Filled 2014-09-10: qty 1

## 2014-09-10 MED ORDER — SODIUM CHLORIDE 0.9 % IV BOLUS (SEPSIS)
1000.0000 mL | Freq: Once | INTRAVENOUS | Status: AC
  Administered 2014-09-10: 1000 mL via INTRAVENOUS

## 2014-09-10 MED ORDER — IBUPROFEN 800 MG PO TABS
800.0000 mg | ORAL_TABLET | Freq: Once | ORAL | Status: AC
  Administered 2014-09-10: 800 mg via ORAL
  Filled 2014-09-10: qty 1

## 2014-09-10 NOTE — ED Notes (Signed)
Patient reports constant lower abdominal pain, lower back pain x1 week. Denies N/V, reports diarrhea. Reports 3 episodes of "passing out" over the last few days, reports LOC, hitting wall. Patient reports dizziness, HA, denies double/blurred vision.

## 2014-09-10 NOTE — ED Notes (Signed)
Pt c/ abd pain x 1 week, pt c/o nausea and vomiting.

## 2014-09-10 NOTE — ED Notes (Signed)
Pelvic supplies at bedside. 

## 2014-09-10 NOTE — ED Provider Notes (Signed)
CSN: [id number]     Arrival date & time 09/10/14  1608 History   First MD Initiated Contact with Patient 09/10/14 2202     Chief Complaint  Patient presents with  . Abdominal Pain     (Consider location/radiation/quality/duration/timing/severity/associated sxs/prior Treatment) HPI Comments: Patient is a [id number] 23 yo F presenting to the ED for 1 week of gradually worsening lower abdominal pain with associated nausea, diarrhea, and vaginal discharge. Patient states she had one to two days of white discharge with occasional pink tinge. No alleviating or aggravating factors. No medications taken prior to arrival. No abdominal surgical history. LMP 08/31/14. Patient is sexually active. Ob/Gyn is Dr. Clearance Coots at [facility]. Denies any dizziness or syncope during my encounter with patient.    Past Medical History  Diagnosis Date  . No pertinent past medical history   . Medical history non-contributory    Past Surgical History  Procedure Laterality Date  . No past surgeries     Family History  Problem Relation Age of Onset  . Cancer Maternal Aunt     uterine  . Diabetes Maternal Aunt   . Hypertension Maternal Aunt   . Cancer Maternal Grandmother     breastt  . Cancer Cousin     leaukemia   History  Substance Use Topics  . Smoking status: Former Smoker    Quit date: 08/29/2011  . Smokeless tobacco: Never Used  . Alcohol Use: No   OB History   Grav Para Term Preterm Abortions TAB SAB Ect Mult Living   0 1 1 0 0 0 2     Review of Systems  Constitutional: Positive for chills and fatigue.  Gastrointestinal: Positive for nausea, abdominal pain and diarrhea. Negative for vomiting.  Genitourinary: Positive for dysuria and vaginal discharge.  All other systems reviewed and are negative.     Allergies  Penicillins  Home Medications   Prior to Admission medications   Medication Sig Start Date End Date Taking? Authorizing Provider  cephALEXin (KEFLEX) 500 MG  capsule Take 1 capsule (500 mg total) by mouth 2 (two) times daily. 09/11/14   Antwion Carpenter L Amilyah Nack, PA-C  doxycycline (VIBRAMYCIN) 100 MG capsule Take 1 capsule (100 mg total) by mouth 2 (two) times daily. 09/11/14   Sufian Ravi L Antoin Dargis, PA-C  metroNIDAZOLE (FLAGYL) 500 MG tablet Take 1 tablet (500 mg total) by mouth 2 (two) times daily. 09/11/14   Jaquelin Meaney L Caprice Mccaffrey, PA-C  ondansetron (ZOFRAN) 4 MG tablet Take 1 tablet (4 mg total) by mouth every 8 (eight) hours as needed for nausea or vomiting. 09/11/14   Lise Auer Damauri Minion, PA-C  traMADol (ULTRAM) 50 MG tablet Take 1 tablet (50 mg total) by mouth every 6 (six) hours as needed. 09/11/14   Emmersen Garraway L Deaundra Kutzer, PA-C   BP 121/62  Pulse 99  Temp(Src) 100.6 F (38.1 C) (Oral)  Resp 22  SpO2 100%  LMP 08/31/2014 Physical Exam  Nursing note and vitals reviewed. Constitutional: She is oriented to person, place, and time. She appears well-developed and well-nourished. No distress.  HENT:  Head: Normocephalic and atraumatic.  Right Ear: External ear normal.  Left Ear: External ear normal.  Nose: Nose normal.  Mouth/Throat: Oropharynx is clear and moist.  Eyes: Conjunctivae are normal.  Neck: Normal range of motion. Neck supple.  Cardiovascular: Normal rate, regular rhythm and normal heart sounds.   Pulmonary/Chest: Effort normal and breath sounds normal.  Abdominal: Soft. Normal appearance and bowel sounds are normal. There  is tenderness in the right lower quadrant, suprapubic area and left lower quadrant. There is no rigidity, no rebound and no guarding.  Musculoskeletal: Normal range of motion.  Neurological: She is alert and oriented to person, place, and time.  Skin: Skin is warm and dry. She is not diaphoretic.  Psychiatric: She has a normal mood and affect.   Exam performed by Francee Piccolo L,  exam chaperoned Date: 09/10/2014 Pelvic exam: normal external genitalia without evidence of trauma. VULVA: normal  appearing vulva with no masses, tenderness or lesion. VAGINA: normal appearing vagina with normal color and discharge, no lesions. CERVIX: normal appearing cervix without lesions, cervical motion tenderness present, cervical os closed with purulent discharge; vaginal discharge - green and thick, Wet prep and DNA probe for chlamydia and GC obtained.   ADNEXA: normal adnexa in size, nontender and no masses UTERUS: uterus is normal size, shape, consistency and nontender.   ED Course  Procedures (including critical care time) Medications  azithromycin (ZITHROMAX) tablet 1,000 mg (not administered)  HYDROmorphone (DILAUDID) injection 1 mg (not administered)  sodium chloride 0.9 % bolus 1,000 mL (1,000 mLs Intravenous New Bag/Given 09/10/14 2335)  morphine 4 MG/ML injection 4 mg (4 mg Intravenous Given 09/10/14 2335)  ibuprofen (ADVIL,MOTRIN) tablet 800 mg (800 mg Oral Given 09/10/14 2338)  cefTRIAXone (ROCEPHIN) 1 g in dextrose 5 % 50 mL IVPB (1 g Intravenous New Bag/Given 09/10/14 2336)    Labs Review Labs Reviewed  WET PREP, GENITAL - Abnormal; Notable for the following:    Clue Cells Wet Prep HPF POC MODERATE (*)    WBC, Wet Prep HPF POC MANY (*)    All other components within normal limits  COMPREHENSIVE METABOLIC PANEL - Abnormal; Notable for the following:    Albumin 3.3 (*)    All other components within normal limits  CBC WITH DIFFERENTIAL - Abnormal; Notable for the following:    Hemoglobin 8.9 (*)    HCT 30.3 (*)    MCV 72.1 (*)    MCH 21.2 (*)    MCHC 29.4 (*)    RDW 18.9 (*)    Monocytes Relative 13 (*)    All other components within normal limits  URINALYSIS, ROUTINE W REFLEX MICROSCOPIC - Abnormal; Notable for the following:    APPearance CLOUDY (*)    Protein, ur 30 (*)    Nitrite POSITIVE (*)    Leukocytes, UA LARGE (*)    All other components within normal limits  URINE MICROSCOPIC-ADD ON - Abnormal; Notable for the following:    Squamous Epithelial / LPF FEW (*)     Bacteria, UA MANY (*)    Casts HYALINE CASTS (*)    All other components within normal limits  I-STAT CG4 LACTIC ACID, ED - Abnormal; Notable for the following:    Lactic Acid, Venous 0.33 (*)    All other components within normal limits  GC/CHLAMYDIA PROBE AMP  LIPASE, BLOOD  PREGNANCY, URINE    Imaging Review No results found.   EKG Interpretation None      MDM   Final diagnoses:  PID (acute pelvic inflammatory disease)  UTI (lower urinary tract infection)    Filed Vitals:   09/10/14 2210  BP:   Pulse:   Temp: 100.6 F (38.1 C)  Resp:    Patient with examination concerning for PID, will treat with Rocephin and Azithromycin. Will obtain Pelvic Ultrasound to ensure no tubo-ovarian abscess. If [place] is unremarkable plan to d/c home with antibiotics for PID, UTI,  with pain medication with advised Ob/Gyn follow up. Patient signed out to Dr. Ranae Palms pending ultrasound.     Jeannetta Ellis, PA-C 09/11/14 0122

## 2014-09-11 ENCOUNTER — Emergency Department (HOSPITAL_COMMUNITY): Payer: Medicaid Other

## 2014-09-11 DIAGNOSIS — Z8249 Family history of ischemic heart disease and other diseases of the circulatory system: Secondary | ICD-10-CM | POA: Diagnosis not present

## 2014-09-11 DIAGNOSIS — N7093 Salpingitis and oophoritis, unspecified: Secondary | ICD-10-CM | POA: Diagnosis present

## 2014-09-11 DIAGNOSIS — Z87891 Personal history of nicotine dependence: Secondary | ICD-10-CM | POA: Diagnosis not present

## 2014-09-11 DIAGNOSIS — N949 Unspecified condition associated with female genital organs and menstrual cycle: Secondary | ICD-10-CM | POA: Diagnosis present

## 2014-09-11 DIAGNOSIS — Z833 Family history of diabetes mellitus: Secondary | ICD-10-CM | POA: Diagnosis not present

## 2014-09-11 LAB — WET PREP, GENITAL
Trich, Wet Prep: NONE SEEN
YEAST WET PREP: NONE SEEN

## 2014-09-11 LAB — I-STAT CG4 LACTIC ACID, ED: Lactic Acid, Venous: 0.33 mmol/L — ABNORMAL LOW (ref 0.5–2.2)

## 2014-09-11 LAB — GC/CHLAMYDIA PROBE AMP
CT PROBE, AMP APTIMA: NEGATIVE
GC PROBE AMP APTIMA: POSITIVE — AB

## 2014-09-11 MED ORDER — ALUM & MAG HYDROXIDE-SIMETH 200-200-20 MG/5ML PO SUSP
30.0000 mL | Freq: Four times a day (QID) | ORAL | Status: DC | PRN
Start: 2014-09-11 — End: 2014-09-14

## 2014-09-11 MED ORDER — HYDROMORPHONE HCL 1 MG/ML IJ SOLN
1.0000 mg | Freq: Once | INTRAMUSCULAR | Status: AC
  Administered 2014-09-11: 1 mg via INTRAVENOUS
  Filled 2014-09-11: qty 1

## 2014-09-11 MED ORDER — ONDANSETRON HCL 4 MG PO TABS: 4.0000 mg | ORAL_TABLET | Freq: Three times a day (TID) | ORAL | Status: DC | PRN

## 2014-09-11 MED ORDER — ONDANSETRON HCL 4 MG PO TABS
4.0000 mg | ORAL_TABLET | Freq: Four times a day (QID) | ORAL | Status: DC | PRN
  Administered 2014-09-13: 4 mg via ORAL
  Filled 2014-09-11: qty 1

## 2014-09-11 MED ORDER — CEPHALEXIN 500 MG PO CAPS: 500.0000 mg | ORAL_CAPSULE | Freq: Two times a day (BID) | ORAL | Status: DC

## 2014-09-11 MED ORDER — ACETAMINOPHEN 325 MG PO TABS: 650.0000 mg | ORAL_TABLET | Freq: Four times a day (QID) | ORAL | Status: DC | PRN

## 2014-09-11 MED ORDER — DEXTROSE 5 % IV SOLN
2.0000 g | Freq: Two times a day (BID) | INTRAVENOUS | Status: DC
  Administered 2014-09-11 – 2014-09-14 (×7): 2 g via INTRAVENOUS
  Filled 2014-09-11 (×7): qty 2

## 2014-09-11 MED ORDER — TRAMADOL HCL 50 MG PO TABS: 50.0000 mg | ORAL_TABLET | Freq: Four times a day (QID) | ORAL | Status: DC | PRN

## 2014-09-11 MED ORDER — DOXYCYCLINE HYCLATE 100 MG PO CAPS: 100.0000 mg | ORAL_CAPSULE | Freq: Two times a day (BID) | ORAL | Status: DC

## 2014-09-11 MED ORDER — ACETAMINOPHEN 650 MG RE SUPP: 650.0000 mg | Freq: Four times a day (QID) | RECTAL | Status: DC | PRN

## 2014-09-11 MED ORDER — IBUPROFEN 800 MG PO TABS
800.0000 mg | ORAL_TABLET | Freq: Three times a day (TID) | ORAL | Status: DC
  Administered 2014-09-11 – 2014-09-14 (×9): 800 mg via ORAL
  Filled 2014-09-11 (×10): qty 1

## 2014-09-11 MED ORDER — AZITHROMYCIN 250 MG PO TABS
1000.0000 mg | ORAL_TABLET | Freq: Once | ORAL | Status: AC
  Administered 2014-09-11: 1000 mg via ORAL
  Filled 2014-09-11: qty 4

## 2014-09-11 MED ORDER — MORPHINE SULFATE 10 MG/ML IJ SOLN
2.0000 mg | INTRAMUSCULAR | Status: DC | PRN
  Administered 2014-09-12 – 2014-09-14 (×2): 2 mg via INTRAVENOUS
  Filled 2014-09-11 (×2): qty 1

## 2014-09-11 MED ORDER — DOXYCYCLINE HYCLATE 100 MG PO TABS
100.0000 mg | ORAL_TABLET | Freq: Two times a day (BID) | ORAL | Status: DC
  Administered 2014-09-11 – 2014-09-14 (×7): 100 mg via ORAL
  Filled 2014-09-11 (×9): qty 1

## 2014-09-11 MED ORDER — DOCUSATE SODIUM 100 MG PO CAPS: 100.0000 mg | ORAL_CAPSULE | Freq: Every day | ORAL | Status: DC | PRN

## 2014-09-11 MED ORDER — DEXTROSE 5 % IV SOLN: 1.0000 g | Freq: Two times a day (BID) | INTRAVENOUS | Status: DC

## 2014-09-11 MED ORDER — METRONIDAZOLE 500 MG PO TABS: 500.0000 mg | ORAL_TABLET | Freq: Two times a day (BID) | ORAL | Status: DC

## 2014-09-11 MED ORDER — ONDANSETRON HCL 4 MG/2ML IJ SOLN: 4.0000 mg | Freq: Four times a day (QID) | INTRAMUSCULAR | Status: DC | PRN

## 2014-09-11 MED ORDER — ZOLPIDEM TARTRATE 5 MG PO TABS
5.0000 mg | ORAL_TABLET | Freq: Every evening | ORAL | Status: DC | PRN
  Administered 2014-09-11 – 2014-09-12 (×2): 5 mg via ORAL
  Filled 2014-09-11 (×2): qty 1

## 2014-09-11 MED ORDER — OXYCODONE HCL 5 MG PO TABS
5.0000 mg | ORAL_TABLET | ORAL | Status: DC | PRN
  Administered 2014-09-11 – 2014-09-14 (×7): 5 mg via ORAL
  Filled 2014-09-11 (×7): qty 1

## 2014-09-11 NOTE — H&P (Signed)
[facility] History and Physical  Krystal Murray:[id number] DOB: 11-Apr-1991 DOA: 09/10/2014  Referring physician: Dr Ranae Palms, ED physician PCP: No PCP Per Patient   Chief Complaint: Left pelvic pain  HPI: Krystal Murray is a 23 y.o. female  With history of PID presents to [facility] ED with one week history of left-sided pelvic pain that is sharp and constant and worsening over the past 2-3 days. The pain improves with rest and with mild to moderate pressure. It is worse when she is ambulating and moving. She admits to fevers and chills. She had a fever at [facility] ED of 100.6.    Her last delivery was with Dr. Clearance Coots of [facility], but last saw him at the delivery of her child 2 years ago.  Review of Systems:  Pt complains of fevers, chills.  Pt denies any nausea, vomiting, chest pain, shortness of breath, diarrhea.  Review of systems are otherwise negative  OB History   Grav Para Term Preterm Abortions TAB SAB Ect Mult Living   0 1 1 0 0 0 2      Past Medical History  Diagnosis Date  . No pertinent past medical history   . Medical history non-contributory    Past Surgical History  Procedure Laterality Date  . No past surgeries     Social History:  reports that she quit smoking about 3 years ago. She has never used smokeless tobacco. She reports that she does not drink alcohol or use illicit drugs. Patient lives at home & is able to participate in activities of daily living without assistance  Allergies  Allergen Reactions  . Penicillins Other (See Comments)    Unknown, childhood allergy    Family History  Problem Relation Age of Onset  . Cancer Maternal Aunt     uterine  . Diabetes Maternal Aunt   . Hypertension Maternal Aunt   . Cancer Maternal Grandmother     breastt  . Cancer Cousin     leaukemia      Prior to Admission medications   Medication Sig Start Date End Date Taking? Authorizing Provider  cephALEXin (KEFLEX)  500 MG capsule Take 1 capsule (500 mg total) by mouth 2 (two) times daily. 09/11/14   Jennifer L Piepenbrink, PA-C  doxycycline (VIBRAMYCIN) 100 MG capsule Take 1 capsule (100 mg total) by mouth 2 (two) times daily. 09/11/14   Jennifer L Piepenbrink, PA-C  metroNIDAZOLE (FLAGYL) 500 MG tablet Take 1 tablet (500 mg total) by mouth 2 (two) times daily. 09/11/14   Jennifer L Piepenbrink, PA-C  ondansetron (ZOFRAN) 4 MG tablet Take 1 tablet (4 mg total) by mouth every 8 (eight) hours as needed for nausea or vomiting. 09/11/14   Lise Auer Piepenbrink, PA-C  traMADol (ULTRAM) 50 MG tablet Take 1 tablet (50 mg total) by mouth every 6 (six) hours as needed. 09/11/14   Jeannetta Ellis, PA-C    Physical Exam: BP 110/59  Pulse 63  Temp(Src) 98.1 F (36.7 C) (Oral)  Resp 18  SpO2 100%  LMP 08/31/2014  General: Young black female. Awake and alert and oriented x3. No acute cardiopulmonary distress.  Eyes: Pupils equal, roundt. Extraocular muscles are intact. Sclerae anicteric and noninjected.  ENT: Moist mucosal membranes. No mucosal lesions. Teeth in good repair  Neck: Neck supple without lymphadenopathy. No carotid bruits. No masses palpated.  Cardiovascular: Regular rate with normal S1-S2 sounds. No murmurs, rubs, gallops auscultated. No JVD.  Respiratory: Good  respiratory effort with no wheezes, rales, rhonchi. Lungs clear to auscultation bilaterally.  Abdomen: Soft, nondistended. Active bowel sounds. No masses or hepatosplenomegaly. Pain in lower quadrants left greater than right. Moderate rebound tenderness. No guarding.  Skin: Dry, warm to touch. 2+ dorsalis pedis and radial pulses. Musculoskeletal: No calf or leg pain. All major joints not erythematous nontender.  Psychiatric: Intact judgment and insight.  Neurologic: No focal neurological deficits. Cranial nerves II through XII are grossly intact.           Labs on Admission:  Basic Metabolic Panel:  Recent Labs Lab 09/10/14 1649   NA 138  K 3.7  CL 103  CO2 24  GLUCOSE 84  BUN 10  CREATININE 0.80  CALCIUM 8.9   Liver Function Tests:  Recent Labs Lab 09/10/14 1649  AST 14  ALT 6  ALKPHOS 43  BILITOT 0.5  PROT 7.5  ALBUMIN 3.3*    Recent Labs Lab 09/10/14 1649  LIPASE 12   No results found for this basename: AMMONIA,  in the last 168 hours CBC:  Recent Labs Lab 09/10/14 1649  WBC 7.7  NEUTROABS 4.2  HGB 8.9*  HCT 30.3*  MCV 72.1*  PLT 207   Cardiac Enzymes: No results found for this basename: CKTOTAL, CKMB, CKMBINDEX, TROPONINI,  in the last 168 hours  BNP (last 3 results) No results found for this basename: PROBNP,  in the last 8760 hours CBG: No results found for this basename: GLUCAP,  in the last 168 hours  Radiological Exams on Admission: US Transvaginal Non-ob  09/11/2014   CLINICAL DATA:  Pelvic pain, cervical motion tenderness, suggesting tubo-ovarian abscess.  EXAM: TRANSABDOMINAL AND TRANSVAGINAL ULTRASOUND OF PELVIS  DOPPLER ULTRASOUND OF OVARIES  TECHNIQUE: Both transabdominal and transvaginal ultrasound examinations of the pelvis were performed. Transabdominal technique was performed for global imaging of the pelvis including uterus, ovaries, adnexal regions, and pelvic cul-de-sac.  It was necessary to proceed with endovaginal exam following the transabdominal exam to visualize the endometrium and ovaries. Color and duplex Doppler ultrasound was utilized to evaluate blood flow to the ovaries.  COMPARISON:  04/03/2014  FINDINGS: Uterus  Measurements: 10.8 x 5.0 x 6.2 cm. No fibroids or other mass visualized.  Endometrium  Thickness: 2 mm.  No focal abnormality visualized.  Right ovary  Measurements: 5.0 x 3.2 with 4.1 cm. Normal appearance/no adnexal mass.  Left ovary  Measurements: 8.9 X 4.2 x 4.0 cm. Left ovary is enlarged with areas of complex cystic change and solid tissue. Dilated tubular structure with complex fluid extends into the left ovary.  Pulsed Doppler evaluation of  both ovaries demonstrates normal low-resistance arterial and venous waveforms.  Other findings  Moderate free fluid in the pelvis  IMPRESSION: Complex cystic lesion left adnexa with appearance that would be compatible with the clinically suspected entity of tubo-ovarian abscess. Ovarian neoplasm is not excluded however, and ultrasound follow-up recommended after appropriate therapy in about 4-6 weeks to re-evaluate.   Electronically Signed   By: Esperanza Heir M.D.   On: 09/11/2014 02:35   US Pelvis Complete  09/11/2014   CLINICAL DATA:  Pelvic pain, cervical motion tenderness, suggesting tubo-ovarian abscess.  EXAM: TRANSABDOMINAL AND TRANSVAGINAL ULTRASOUND OF PELVIS  DOPPLER ULTRASOUND OF OVARIES  TECHNIQUE: Both transabdominal and transvaginal ultrasound examinations of the pelvis were performed. Transabdominal technique was performed for global imaging of the pelvis including uterus, ovaries, adnexal regions, and pelvic cul-de-sac.  It was necessary to proceed with endovaginal exam following the transabdominal exam  to visualize the endometrium and ovaries. Color and duplex Doppler ultrasound was utilized to evaluate blood flow to the ovaries.  COMPARISON:  04/03/2014  FINDINGS: Uterus  Measurements: 10.8 x 5.0 x 6.2 cm. No fibroids or other mass visualized.  Endometrium  Thickness: 2 mm.  No focal abnormality visualized.  Right ovary  Measurements: 5.0 x 3.2 with 4.1 cm. Normal appearance/no adnexal mass.  Left ovary  Measurements: 8.9 X 4.2 x 4.0 cm. Left ovary is enlarged with areas of complex cystic change and solid tissue. Dilated tubular structure with complex fluid extends into the left ovary.  Pulsed Doppler evaluation of both ovaries demonstrates normal low-resistance arterial and venous waveforms.  Other findings  Moderate free fluid in the pelvis  IMPRESSION: Complex cystic lesion left adnexa with appearance that would be compatible with the clinically suspected entity of tubo-ovarian abscess.  Ovarian neoplasm is not excluded however, and ultrasound follow-up recommended after appropriate therapy in about 4-6 weeks to re-evaluate.   Electronically Signed   By: Esperanza Heir M.D.   On: 09/11/2014 02:35   [place] Art/ven Flow Abd Pelv Doppler  09/11/2014   CLINICAL DATA:  Pelvic pain, cervical motion tenderness, suggesting tubo-ovarian abscess.  EXAM: TRANSABDOMINAL AND TRANSVAGINAL ULTRASOUND OF PELVIS  DOPPLER ULTRASOUND OF OVARIES  TECHNIQUE: Both transabdominal and transvaginal ultrasound examinations of the pelvis were performed. Transabdominal technique was performed for global imaging of the pelvis including uterus, ovaries, adnexal regions, and pelvic cul-de-sac.  It was necessary to proceed with endovaginal exam following the transabdominal exam to visualize the endometrium and ovaries. Color and duplex Doppler ultrasound was utilized to evaluate blood flow to the ovaries.  COMPARISON:  04/03/2014  FINDINGS: Uterus  Measurements: 10.8 x 5.0 x 6.2 cm. No fibroids or other mass visualized.  Endometrium  Thickness: 2 mm.  No focal abnormality visualized.  Right ovary  Measurements: 5.0 x 3.2 with 4.1 cm. Normal appearance/no adnexal mass.  Left ovary  Measurements: 8.9 X 4.2 x 4.0 cm. Left ovary is enlarged with areas of complex cystic change and solid tissue. Dilated tubular structure with complex fluid extends into the left ovary.  Pulsed Doppler evaluation of both ovaries demonstrates normal low-resistance arterial and venous waveforms.  Other findings  Moderate free fluid in the pelvis  IMPRESSION: Complex cystic lesion left adnexa with appearance that would be compatible with the clinically suspected entity of tubo-ovarian abscess. Ovarian neoplasm is not excluded however, and ultrasound follow-up recommended after appropriate therapy in about 4-6 weeks to re-evaluate.   Electronically Signed   By: Esperanza Heir M.D.   On: 09/11/2014 02:35     Assessment/Plan Present on Admission:  .  Tubo-ovarian abscess  #1 tubo-ovarian abscess We'll admit the patient for close monitoring as this may easily rupture. We'll start the patient on cefotetan 2 g IV every 12 hours and doxycycline 100 mg by mouth twice a day. We'll continue with morphine or oxycodone for pain as well as scheduled ibuprofen. As antipyretic will continue with Tylenol.  DVT prophylaxis: The patient is at extremely low risk for DVT due to her age. Early ambulation will suffice for DVT prophylaxis  Consultants: None  Code Status: Full  Family Communication: Father in the room   Disposition Plan: Home following improvement  Time spent: 50 minutes  Candelaria Celeste, DO Attending Physician Faculty Practice, [facility] [phone]  **Disclaimer: This note may have been dictated with voice recognition software. Similar sounding words can inadvertently be transcribed and this note may contain  transcription errors which may not have been corrected upon publication of note.**

## 2014-09-11 NOTE — Discharge Instructions (Signed)
Please follow up with your primary care physician in 1-2 days. If you do not have one please call the [facility] and wellness Center number listed above. Please follow up with an Ob/Gyn to schedule a follow up appointment.  Please take your antibiotic until completion. Please refrain from having sex for 10 days to allow antibiotics to work. Please read all discharge instructions and return precautions.   Pelvic Inflammatory Disease Pelvic inflammatory disease (PID) refers to an infection in some or all of the female organs. The infection can be in the uterus, ovaries, fallopian tubes, or the surrounding tissues in the pelvis. PID can cause abdominal or pelvic pain that comes on suddenly (acute pelvic pain). PID is a serious infection because it can lead to lasting (chronic) pelvic pain or the inability to have children (infertile).  CAUSES  The infection is often caused by the normal bacteria found in the vaginal tissues. PID may also be caused by an infection that is spread during sexual contact. PID can also occur following:   The birth of a baby.   A miscarriage.   An abortion.   Major pelvic surgery.   The use of an intrauterine device (IUD).   A sexual assault.  RISK FACTORS Certain factors can put a person at higher risk for PID, such as:  Being younger than 25 years.  Being sexually active at [place] age.  Usingnonbarrier contraception.  Havingmultiple sexual partners.  Having sex with someone who has symptoms of a genital infection.  Using oral contraception. Other times, certain behaviors can increase the possibility of getting PID, such as:  Having sex during your period.  Using a vaginal douche.  Having an intrauterine device (IUD) in place. SYMPTOMS   Abdominal or pelvic pain.   Fever.   Chills.   Abnormal vaginal discharge.  Abnormal uterine bleeding.   Unusual pain shortly after finishing your period. DIAGNOSIS  Your caregiver will  choose some of the following methods to make a diagnosis, such as:   Performinga physical exam and history. A pelvic exam typically reveals a very tender uterus and surrounding pelvis.   Ordering laboratory tests including a pregnancy test, blood tests, and urine test.  Orderingcultures of the vagina and cervix to check for a sexually transmitted infection (STI).  Performing an ultrasound.   Performing a laparoscopic procedure to look inside the pelvis.  TREATMENT   Antibiotic medicines may be prescribed and taken by mouth.   Sexual partners may be treated when the infection is caused by a sexually transmitted disease (STD).   Hospitalization may be needed to give antibiotics intravenously.  Surgery may be needed, but this is rare. It may take weeks until you are completely well. If you are diagnosed with PID, you should also be checked for human immunodeficiency virus (HIV). HOME CARE INSTRUCTIONS   If given, take your antibiotics as directed. Finish the medicine even if you start to feel better.   Only take over-the-counter or prescription medicines for pain, discomfort, or fever as directed by your caregiver.   Do not have sexual intercourse until treatment is completed or as directed by your caregiver. If PID is confirmed, your recent sexual partner(s) will need treatment.   Keep your follow-up appointments. SEEK MEDICAL CARE IF:   You have increased or abnormal vaginal discharge.   You need prescription medicine for your pain.   You vomit.   You cannot take your medicines.   Your partner has an STD.  SEEK IMMEDIATE MEDICAL  CARE IF:   You have a fever.   You have increased abdominal or pelvic pain.   You have chills.   You have pain when you urinate.   You are not better after 72 hours following treatment.  MAKE SURE YOU:   Understand these instructions.  Will watch your condition.  Will get help right away if you are not doing well  or get worse. Document Released: 12/07/2005 Document Revised: 04/03/2013 Document Reviewed: 12/03/2011 [facility] Patient Information 2015 [place], [place]. This information is not intended to replace advice given to you by your health care provider. Make sure you discuss any questions you have with your health care provider.  Urinary Tract Infection Urinary tract infections (UTIs) can develop anywhere along your urinary tract. Your urinary tract is your body's drainage system for removing wastes and extra water. Your urinary tract includes two kidneys, two ureters, a bladder, and a urethra. Your kidneys are a pair of bean-shaped organs. Each kidney is about the size of your fist. They are located below your ribs, one on each side of your spine. CAUSES Infections are caused by microbes, which are microscopic organisms, including fungi, viruses, and bacteria. These organisms are so small that they can only be seen through a microscope. Bacteria are the microbes that most commonly cause UTIs. SYMPTOMS  Symptoms of UTIs may vary by age and gender of the patient and by the location of the infection. Symptoms in young women typically include a frequent and intense urge to urinate and a painful, burning feeling in the bladder or urethra during urination. Older women and men are more likely to be tired, shaky, and weak and have muscle aches and abdominal pain. A fever may mean the infection is in your kidneys. Other symptoms of a kidney infection include pain in your back or sides below the ribs, nausea, and vomiting. DIAGNOSIS To diagnose a UTI, your caregiver will ask you about your symptoms. Your caregiver also will ask to provide a urine sample. The urine sample will be tested for bacteria and white blood cells. White blood cells are made by your body to help fight infection. TREATMENT  Typically, UTIs can be treated with medication. Because most UTIs are caused by a bacterial infection, they usually can be  treated with the use of antibiotics. The choice of antibiotic and length of treatment depend on your symptoms and the type of bacteria causing your infection. HOME CARE INSTRUCTIONS  If you were prescribed antibiotics, take them exactly as your caregiver instructs you. Finish the medication even if you feel better after you have only taken some of the medication.  Drink enough water and fluids to keep your urine clear or pale yellow.  Avoid caffeine, tea, and carbonated beverages. They tend to irritate your bladder.  Empty your bladder often. Avoid holding urine for long periods of time.  Empty your bladder before and after sexual intercourse.  After a bowel movement, women should cleanse from front to back. Use each tissue only once. SEEK MEDICAL CARE IF:   You have back pain.  You develop a fever.  Your symptoms do not begin to resolve within 3 days. SEEK IMMEDIATE MEDICAL CARE IF:   You have severe back pain or lower abdominal pain.  You develop chills.  You have nausea or vomiting.  You have continued burning or discomfort with urination. MAKE SURE YOU:   Understand these instructions.  Will watch your condition.  Will get help right away if you are not  doing well or get worse. Document Released: 09/16/2005 Document Revised: 06/07/2012 Document Reviewed: 01/15/2012 [facility] Patient Information 2015 [place], [place]. This information is not intended to replace advice given to you by your health care provider. Make sure you discuss any questions you have with your health care provider.

## 2014-09-11 NOTE — ED Notes (Signed)
Bedside US being done on pt since 0053, therefore vital signs were not done as requested.  Will update once [place] is complete.

## 2014-09-11 NOTE — ED Provider Notes (Signed)
Medical screening examination/treatment/procedure(s) were conducted as a shared visit with non-physician practitioner(s) and myself.  I personally evaluated the patient during the encounter.   EKG Interpretation None      Patient presents with lower abdominal pain and vaginal discharge. Pelvic exam consistent with PID. Continued to have severe lower abdominal pain requiring multiple doses of narcotic medications. Ultrasound concerning for tubo-ovarian abscess. Discussed with OB/GYN on call in transfer to [facility] hospital  Loren Racer, MD 09/11/14 2321

## 2014-09-11 NOTE — Care Management Note (Signed)
    Page 1 of 1   09/11/2014     12:49:15 PM CARE MANAGEMENT NOTE 09/11/2014  Patient:  Krystal Murray, Krystal Murray   Account Number:  [id number]  Date Initiated:  09/11/2014  Documentation initiated by:  Roseanne Reno  Subjective/Objective Assessment:   Left Pelvic Pain  Tubo-ovarian abscess    Action/Plan:   Medication assistance.   Anticipated DC Date:  09/13/2014   Anticipated DC Plan:  HOME/SELF CARE     DC Planning Services  CM consult  Medication Assistance      Discharge Disposition:   Home   Comments:  09/11/14  1215p  Notified by pt's Nurse that she (the Pt) needs assistance obtaining her medications at dc.  Per EPIC, pt has Medicaid.  CM ask financial counselor to verify that the Medicaid was active and it is active.  CM spoke w/ pt who wasn't sure if her Medicaid was active.  CM explained that her Medicaid was acitive per our financial counselor and therefore we would not be able to assist her in obtaining her medications at dc.  With Medicaid her co-pay should be low roughly $3 - 4.00 each.  Pt voiced understanding.  CM made nurse aware.  CM available to assist as needed.  TJohnson, RNBSN   [phone]

## 2014-09-11 NOTE — ED Notes (Signed)
Critical value reported to [facility]. EDPA

## 2014-09-11 NOTE — Progress Notes (Signed)
CSW acknowledged consult for difficulties paying for medication.   CSW spoke with RN regarding needing to contact care management. RN to follow-up with care management to address patient's needs.

## 2014-09-12 DIAGNOSIS — Z87891 Personal history of nicotine dependence: Secondary | ICD-10-CM

## 2014-09-12 DIAGNOSIS — Z8249 Family history of ischemic heart disease and other diseases of the circulatory system: Secondary | ICD-10-CM

## 2014-09-12 DIAGNOSIS — N949 Unspecified condition associated with female genital organs and menstrual cycle: Secondary | ICD-10-CM

## 2014-09-12 DIAGNOSIS — N7093 Salpingitis and oophoritis, unspecified: Secondary | ICD-10-CM

## 2014-09-12 LAB — CBC
HEMATOCRIT: 25.5 % — AB (ref 36.0–46.0)
HEMOGLOBIN: 7.8 g/dL — AB (ref 12.0–15.0)
MCH: 21.8 pg — ABNORMAL LOW (ref 26.0–34.0)
MCHC: 30.6 g/dL (ref 30.0–36.0)
MCV: 71.4 fL — ABNORMAL LOW (ref 78.0–100.0)
Platelets: 158 10*3/uL (ref 150–400)
RBC: 3.57 MIL/uL — ABNORMAL LOW (ref 3.87–5.11)
RDW: 18.6 % — ABNORMAL HIGH (ref 11.5–15.5)
WBC: 5.8 10*3/uL (ref 4.0–10.5)

## 2014-09-12 NOTE — Progress Notes (Signed)
Subjective:some improvement Patient reports tolerating PO and no problems voiding.    Objective: I have reviewed patient's vital signs, intake and output, medications and radiology results.  General: alert, cooperative and no distress GI: abnormal findings:  soft, mild lower quadrant tenderness   Assessment/Plan: PID, left TOA, continue IV ABX   LOS: 2 days    ARNOLD,JAMES 09/12/2014, 7:39 AM

## 2014-09-12 NOTE — Progress Notes (Signed)
Ur chart review completed.  

## 2014-09-13 MED ORDER — OXYCODONE-ACETAMINOPHEN 5-325 MG PO TABS
2.0000 | ORAL_TABLET | Freq: Once | ORAL | Status: AC
  Administered 2014-09-13: 2 via ORAL
  Filled 2014-09-13: qty 2

## 2014-09-13 MED ORDER — SODIUM CHLORIDE 0.9 % IV SOLN
1020.0000 mg | Freq: Once | INTRAVENOUS | Status: AC
  Administered 2014-09-13: 1020 mg via INTRAVENOUS
  Filled 2014-09-13: qty 34

## 2014-09-13 NOTE — Progress Notes (Signed)
INTERVAL PROGRESS NOTE  Called by RN about patient endorsing 7-8/10 pain. Patient received morphine  IV at 2140 and Ambien  at 2140. RN noted patient to be talking on the phone and laughing earlier.   S: Pt states that she was given the IV medication which did not help. She was very frustrated about receiving Ibuprofen as it "does not do anything."  She just wants the pain to go away and to get some sleep. Pain in most prominent on the left than the right but states it is all over. Tolerating PO intake. No fever/chills.  O:  Filed Vitals:   09/12/14 0511 09/12/14 1200 09/12/14 1811 09/12/14 2110  BP: 105/58 108/56 100/54 107/58  Pulse: 71 86 73 64  Temp: 98.3 F (36.8 C) 99.5 F (37.5 C) 98.9 F (37.2 C) 98.2 F (36.8 C)  TempSrc: Oral Oral Oral Oral  Resp: Height:      Weight:      SpO2: 100% 100% 100% 100%  General: Does not appear to be in acute distress. Abdomen: Soft. Tender to palpation in the lower quadrants b/l, L>R. No rebound or guarding. No really tenderness in the upper quadrants/epigrastrium.  A/P: Patient does not appear to be in acute distress. No fever or chills. No rebound or guarding.  RN will hold morphine  that is currently due. Will try a one time dose of percocet 5/325mg  2 tablets to see if this provides better pain relief.

## 2014-09-13 NOTE — Progress Notes (Signed)
Patient stated that the Percocet 2 tabs works better than Oxycodone for pain relief.

## 2014-09-13 NOTE — Progress Notes (Signed)
LOS 3   Subjective: Patient reports feeling a little better, she was sleeping very soundly.    Objective: I have reviewed patient's vital signs, intake and output, medications and labs. Filed Vitals:   09/12/14 1200 09/12/14 1811 09/12/14 2110 09/13/14 0534  BP: 108/56 100/54 107/58 93/48  Pulse: 86 73 64 54  Temp: 99.5 F (37.5 C) 98.9 F (37.2 C) 98.2 F (36.8 C) 97.9 F (36.6 C)  TempSrc: Oral Oral Oral Oral  Resp: Height:      Weight:      SpO2: 100% 100% 100% 100%    General: alert, cooperative and no distress GI: scapphoid, moderately tender LLQ no rebound   Assessment/Plan: Tubo ovarian complex:  Continue IV abx for now, needs another day or two, will give IV iron as makes it a bit more difficult to improve form this infection being so anmeic   LOS: 3 days    EURE,LUTHER H 09/13/2014, 7:24 AM

## 2014-09-14 MED ORDER — DOXYCYCLINE HYCLATE 100 MG PO TABS: 100.0000 mg | ORAL_TABLET | Freq: Two times a day (BID) | ORAL | Status: DC

## 2014-09-14 MED ORDER — AZITHROMYCIN 500 MG PO TABS: 1000.0000 mg | ORAL_TABLET | Freq: Once | ORAL | Status: DC

## 2014-09-14 MED ORDER — OXYCODONE HCL 5 MG PO TABS: 5.0000 mg | ORAL_TABLET | ORAL | Status: DC | PRN

## 2014-09-14 NOTE — Discharge Summary (Signed)
Physician Discharge Summary  Patient ID: Krystal Murray MRN: [id number] DOB/AGE: [birth year] 22 y.o.  Admit date: 09/10/2014 Discharge date: 09/14/2014  Admission Diagnoses:  Discharge Diagnoses:  Active Problems:   Tubo-ovarian abscess   Discharged Condition: good  Hospital Course: admitted with TOA, seen 1 week prior at [facility] ED with PID.  Symptoms worsened with PO meds and patient presented febrile, ultrasound suggestive of tubo-ovarian abscess.  Reporting that she does not believe she can remember to take the antibiotics as she is very busy.  Will give first line treatment of doxycycline x 2 weeks BID, also giving  azithromycin (2nd dose) on day 5 to double cover as patient will likely not comply with regimen.  Consults: None  Significant Diagnostic Studies: ultrasound, G/C  Treatments: IV hydration, antibiotics: azithromycin and cefotetan, doxycycline and analgesia: Morphine and percocet  Discharge Exam: Blood pressure 113/64, pulse 66, temperature 98.2 F (36.8 C), temperature source Oral, resp. rate 20, height  (1.651 m), weight 147 lb 8 oz (66.906 kg), last menstrual period 08/31/2014, SpO2 100.00%, unknown if currently breastfeeding. General appearance: alert, cooperative and no distress Resp: normal effort Cardio: regular rate GI: soft, non-tender; bowel sounds normal; no masses,  no organomegaly Extremities: extremities normal, atraumatic, no cyanosis or edema Pulses: 2+ and symmetric Skin: Skin color, texture, turgor normal. No rashes or lesions  Disposition: discharge    Medication List         azithromycin 500 MG tablet  Commonly known as:  ZITHROMAX  Take 2 tablets (1,000 mg total) by mouth once.  Start taking on:  09/16/2014     cephALEXin 500 MG capsule  Commonly known as:  KEFLEX  Take 1 capsule (500 mg total) by mouth 2 (two) times daily.     doxycycline 100 MG capsule  Commonly known as:  VIBRAMYCIN  Take 1 capsule (100 mg  total) by mouth 2 (two) times daily.     doxycycline 100 MG tablet  Commonly known as:  VIBRA-TABS  Take 1 tablet (100 mg total) by mouth every 12 (twelve) hours.     ondansetron 4 MG tablet  Commonly known as:  ZOFRAN  Take 1 tablet (4 mg total) by mouth every 8 (eight) hours as needed for nausea or vomiting.     oxyCODONE 5 MG immediate release tablet  Commonly known as:  Oxy IR/ROXICODONE  Take 1 tablet (5 mg total) by mouth every 4 (four) hours as needed for moderate pain.     traMADol 50 MG tablet  Commonly known as:  ULTRAM  Take 1 tablet (50 mg total) by mouth every 6 (six) hours as needed.       Follow-up Information   Schedule an appointment as soon as possible for a visit with [facility].   Contact information:   [address] [place] [address] [phone]      Signed: Perry Mount 09/14/2014, 10:06 AM

## 2014-09-14 NOTE — Progress Notes (Signed)
Discharge teaching complete. Pt understood all instructions and did not have any questions. Pt ambulated out of the hospital and discharged home to family.  

## 2014-10-22 ENCOUNTER — Encounter (HOSPITAL_COMMUNITY): Payer: Self-pay | Admitting: Emergency Medicine

## 2015-01-17 ENCOUNTER — Emergency Department (HOSPITAL_COMMUNITY)
Admission: EM | Admit: 2015-01-17 | Discharge: 2015-01-17 | Disposition: A | Discharge status: Home / Self Care | Payer: Medicaid Other | Attending: Emergency Medicine | Admitting: Emergency Medicine

## 2015-01-17 ENCOUNTER — Encounter (HOSPITAL_COMMUNITY): Payer: Self-pay | Admitting: Emergency Medicine

## 2015-01-17 DIAGNOSIS — Z87891 Personal history of nicotine dependence: Secondary | ICD-10-CM | POA: Diagnosis not present

## 2015-01-17 DIAGNOSIS — Z880 Allergy status to penicillin: Secondary | ICD-10-CM | POA: Insufficient documentation

## 2015-01-17 DIAGNOSIS — Z3202 Encounter for pregnancy test, result negative: Secondary | ICD-10-CM | POA: Diagnosis not present

## 2015-01-17 DIAGNOSIS — R102 Pelvic and perineal pain: Secondary | ICD-10-CM | POA: Diagnosis present

## 2015-01-17 DIAGNOSIS — N730 Acute parametritis and pelvic cellulitis: Secondary | ICD-10-CM | POA: Insufficient documentation

## 2015-01-17 LAB — URINALYSIS, ROUTINE W REFLEX MICROSCOPIC
BILIRUBIN URINE: NEGATIVE
GLUCOSE, UA: NEGATIVE mg/dL
KETONES UR: NEGATIVE mg/dL
Leukocytes, UA: NEGATIVE
Nitrite: NEGATIVE
PH: 6 (ref 5.0–8.0)
Protein, ur: 30 mg/dL — AB
Specific Gravity, Urine: 1.031 — ABNORMAL HIGH (ref 1.005–1.030)
Urobilinogen, UA: 0.2 mg/dL (ref 0.0–1.0)

## 2015-01-17 LAB — WET PREP, GENITAL
Trich, Wet Prep: NONE SEEN
Yeast Wet Prep HPF POC: NONE SEEN

## 2015-01-17 LAB — GC/CHLAMYDIA PROBE AMP (~~LOC~~) NOT AT ARMC
Chlamydia: NEGATIVE
Neisseria Gonorrhea: NEGATIVE

## 2015-01-17 LAB — URINE MICROSCOPIC-ADD ON

## 2015-01-17 LAB — PREGNANCY, URINE: Preg Test, Ur: NEGATIVE

## 2015-01-17 MED ORDER — TRAMADOL HCL 50 MG PO TABS: 50.0000 mg | ORAL_TABLET | Freq: Four times a day (QID) | ORAL | Status: DC | PRN

## 2015-01-17 MED ORDER — CEFTRIAXONE SODIUM 250 MG IJ SOLR
250.0000 mg | Freq: Once | INTRAMUSCULAR | Status: AC
  Administered 2015-01-17: 250 mg via INTRAMUSCULAR
  Filled 2015-01-17: qty 250

## 2015-01-17 MED ORDER — IBUPROFEN 200 MG PO TABS
600.0000 mg | ORAL_TABLET | Freq: Once | ORAL | Status: AC
  Administered 2015-01-17: 600 mg via ORAL
  Filled 2015-01-17: qty 3

## 2015-01-17 MED ORDER — DOXYCYCLINE CALCIUM 50 MG/5ML PO SYRP: 100.0000 mg | ORAL_SOLUTION | Freq: Two times a day (BID) | ORAL | Status: DC

## 2015-01-17 NOTE — ED Provider Notes (Signed)
CSN: [id number]     Arrival date & time 01/17/15  0000 History   First MD Initiated Contact with Patient 01/17/15 0259     Chief Complaint  Patient presents with  . Pelvic Pain     (Consider location/radiation/quality/duration/timing/severity/associated sxs/prior Treatment) HPI Comments: Pt comes in with suprapubic abd pain x 2 days that is intermittent, radiating to the back. PAin now constant and sharp and stabbing. No n/v/f/c. +STD hx in the past. Pt has mild vaginal bleeding and also she has had some discharge in the recent past.   Patient is a 24 y.o. female presenting with pelvic pain. The history is provided by the patient.  Pelvic Pain Pertinent negatives include no chest pain, no abdominal pain, no headaches and no shortness of breath.    Past Medical History  Diagnosis Date  . No pertinent past medical history   . Medical history non-contributory    Past Surgical History  Procedure Laterality Date  . No past surgeries     Family History  Problem Relation Age of Onset  . Cancer Maternal Aunt     uterine  . Diabetes Maternal Aunt   . Hypertension Maternal Aunt   . Cancer Maternal Grandmother     breastt  . Cancer Cousin     leaukemia   History  Substance Use Topics  . Smoking status: Former Smoker    Quit date: 08/29/2011  . Smokeless tobacco: Never Used  . Alcohol Use: No   OB History    Gravida Para Term Preterm AB TAB SAB Ectopic Multiple Living   4 2 2  0 1 1 0 0 0 2     Review of Systems  Constitutional: Positive for activity change.  Respiratory: Negative for shortness of breath.   Cardiovascular: Negative for chest pain.  Gastrointestinal: Negative for nausea, vomiting and abdominal pain.  Genitourinary: Positive for vaginal bleeding, vaginal discharge and pelvic pain. Negative for dysuria.  Musculoskeletal: Negative for neck pain.  Neurological: Negative for headaches.  All other systems reviewed and are negative.     Allergies   Penicillins  Home Medications   Prior to Admission medications   Medication Sig Start Date End Date Taking? Authorizing Provider  azithromycin (ZITHROMAX) 500 MG tablet Take 2 tablets (1,000 mg total) by mouth once. Patient not taking: Reported on 01/17/2015 09/16/14   Joanna Puffrystal S Dorsey, MD  cephALEXin (KEFLEX) 500 MG capsule Take 1 capsule (500 mg total) by mouth 2 (two) times daily. Patient not taking: Reported on 01/17/2015 09/11/14   Lise AuerJennifer L Piepenbrink, PA-C  doxycycline (VIBRA-TABS) 100 MG tablet Take 1 tablet (100 mg total) by mouth every 12 (twelve) hours. Patient not taking: Reported on 01/17/2015 09/14/14   Joanna Puffrystal S Dorsey, MD  doxycycline (VIBRAMYCIN) 100 MG capsule Take 1 capsule (100 mg total) by mouth 2 (two) times daily. Patient not taking: Reported on 01/17/2015 09/11/14   Lise AuerJennifer L Piepenbrink, PA-C  doxycycline (VIBRAMYCIN) 50 MG/5ML SYRP Take 10 mLs (100 mg total) by mouth 2 (two) times daily. 01/17/15   Derwood KaplanAnkit Aleira Deiter, MD  ondansetron (ZOFRAN) 4 MG tablet Take 1 tablet (4 mg total) by mouth every 8 (eight) hours as needed for nausea or vomiting. Patient not taking: Reported on 01/17/2015 09/11/14   Lise AuerJennifer L Piepenbrink, PA-C  oxyCODONE (OXY IR/ROXICODONE) 5 MG immediate release tablet Take 1 tablet (5 mg total) by mouth every 4 (four) hours as needed for moderate pain. Patient not taking: Reported on 01/17/2015 09/14/14   Joanna Puffrystal S Dorsey, MD  traMADol (ULTRAM) 50 MG tablet Take 1 tablet (50 mg total) by mouth every 6 (six) hours as needed. 01/17/15   Berman Grainger, MD   BP 105/56 mmHg  Pulse 78  Temp(Src) 97.7 F (36.5 C) (Oral)  Resp 16  Ht  (1.651 m)  Wt 145 lb (65.772 kg)  BMI 24.13 kg/m2  SpO2 100%  LMP 01/17/2015 Physical Exam  Constitutional: She is oriented to person, place, and time. She appears well-developed and well-nourished.  HENT:  Head: Normocephalic and atraumatic.  Eyes: Conjunctivae and EOM are normal. Pupils are equal, round, and reactive to  light.  Neck: Normal range of motion. Neck supple.  Cardiovascular: Normal rate, regular rhythm, normal heart sounds and intact distal pulses.   No murmur heard. Pulmonary/Chest: Effort normal. No respiratory distress.  Abdominal: Soft. Bowel sounds are normal. She exhibits no distension. There is no tenderness. There is no rebound and no guarding.  Genitourinary: Vagina normal and uterus normal.  External exam - normal, no lesions Speculum exam: Pt has some white discharge, + blood Bimanual exam: Patient has + CMT, no adnexal tenderness or fullness and cervical os is closed  Neurological: She is alert and oriented to person, place, and time.  Skin: Skin is warm and dry.  Nursing note and vitals reviewed.   ED Course  Procedures (including critical care time) Labs Review Labs Reviewed  WET PREP, GENITAL - Abnormal; Notable for the following:    Clue Cells Wet Prep HPF POC FEW (*)    WBC, Wet Prep HPF POC FEW (*)    All other components within normal limits  URINALYSIS, ROUTINE W REFLEX MICROSCOPIC - Abnormal; Notable for the following:    Color, Urine AMBER (*)    APPearance CLOUDY (*)    Specific Gravity, Urine 1.031 (*)    Hgb urine dipstick LARGE (*)    Protein, ur 30 (*)    All other components within normal limits  URINE MICROSCOPIC-ADD ON - Abnormal; Notable for the following:    Squamous Epithelial / LPF FEW (*)    Bacteria, UA FEW (*)    All other components within normal limits  PREGNANCY, URINE  GC/CHLAMYDIA PROBE AMP ([place])    Imaging Review No results found.   EKG Interpretation None      MDM   Final diagnoses:  PID (acute pelvic inflammatory disease)    Pt comes in with cc of abd pain, lower quadrant. She is on her menstrual period, but the current pain is not typical of her menstrual cycle. + hx of STD, and there is CMT. Pt having occasional unprotected intercourse with 1 partner. Will tx as PID. Asked to see PCP for further eval  Derwood Kaplan, MD 01/19/15 [phone]

## 2015-01-17 NOTE — Discharge Instructions (Signed)
Pelvic Inflammatory Disease °Pelvic inflammatory disease (PID) refers to an infection in some or all of the female organs. The infection can be in the uterus, ovaries, fallopian tubes, or the surrounding tissues in the pelvis. PID can cause abdominal or pelvic pain that comes on suddenly (acute pelvic pain). PID is a serious infection because it can lead to lasting (chronic) pelvic pain or the inability to have children (infertile).  °CAUSES  °The infection is often caused by the normal bacteria found in the vaginal tissues. PID may also be caused by an infection that is spread during sexual contact. PID can also occur following:  °· The birth of a baby.   °· A miscarriage.   °· An abortion.   °· Major pelvic surgery.   °· The use of an intrauterine device (IUD).   °· A sexual assault.   °RISK FACTORS °Certain factors can put a person at higher risk for PID, such as: °· Being younger than 25 years. °· Being sexually active at a young age. °· Using nonbarrier contraception. °· Having multiple sexual partners. °· Having sex with someone who has symptoms of a genital infection. °· Using oral contraception. °Other times, certain behaviors can increase the possibility of getting PID, such as: °· Having sex during your period. °· Using a vaginal douche. °· Having an intrauterine device (IUD) in place. °SYMPTOMS  °· Abdominal or pelvic pain.   °· Fever.   °· Chills.   °· Abnormal vaginal discharge. °· Abnormal uterine bleeding.   °· Unusual pain shortly after finishing your period. °DIAGNOSIS  °Your caregiver will choose some of the following methods to make a diagnosis, such as:  °· Performing a physical exam and history. A pelvic exam typically reveals a very tender uterus and surrounding pelvis.   °· Ordering laboratory tests including a pregnancy test, blood tests, and urine test.  °· Ordering cultures of the vagina and cervix to check for a sexually transmitted infection (STI). °· Performing an ultrasound.    °· Performing a laparoscopic procedure to look inside the pelvis.   °TREATMENT  °· Antibiotic medicines may be prescribed and taken by mouth.   °· Sexual partners may be treated when the infection is caused by a sexually transmitted disease (STD).   °· Hospitalization may be needed to give antibiotics intravenously. °· Surgery may be needed, but this is rare. °It may take weeks until you are completely well. If you are diagnosed with PID, you should also be checked for human immunodeficiency virus (HIV).   °HOME CARE INSTRUCTIONS  °· If given, take your antibiotics as directed. Finish the medicine even if you start to feel better.   °· Only take over-the-counter or prescription medicines for pain, discomfort, or fever as directed by your caregiver.   °· Do not have sexual intercourse until treatment is completed or as directed by your caregiver. If PID is confirmed, your recent sexual partner(s) will need treatment.   °· Keep your follow-up appointments. °SEEK MEDICAL CARE IF:  °· You have increased or abnormal vaginal discharge.   °· You need prescription medicine for your pain.   °· You vomit.   °· You cannot take your medicines.   °· Your partner has an STD.   °SEEK IMMEDIATE MEDICAL CARE IF:  °· You have a fever.   °· You have increased abdominal or pelvic pain.   °· You have chills.   °· You have pain when you urinate.   °· You are not better after 72 hours following treatment.   °MAKE SURE YOU:  °· Understand these instructions. °· Will watch your condition. °· Will get help right away if you are not doing well or get worse. °  Document Released: 12/07/2005 Document Revised: 04/03/2013 Document Reviewed: 12/03/2011 °ExitCare® Patient Information ©2015 [employer]. This information is not intended to replace advice given to you by your health care provider. Make sure you discuss any questions you have with your health care provider. ° °

## 2015-01-17 NOTE — ED Notes (Signed)
Pt presents with pelvic cramping for the past 3 days- admits to starting period yesterday.  Admits to vaginal discharge, denies urinary symptoms.

## 2015-01-17 NOTE — ED Notes (Signed)
Pt states she currently has menstrual period but has not had pain like this with prior menstrual periods. Denies any N/V/D. Pt states she has hx of STD - thinks it was chlamydia. Pt states she had some thick, off-white discharge prior to menstrual period starting.

## 2015-04-29 ENCOUNTER — Emergency Department (HOSPITAL_COMMUNITY)
Admission: EM | Admit: 2015-04-29 | Discharge: 2015-04-30 | Disposition: A | Discharge status: Other Acute Inpatient Hospital | Payer: Medicaid Other | Attending: Emergency Medicine | Admitting: Emergency Medicine

## 2015-04-29 ENCOUNTER — Encounter (HOSPITAL_COMMUNITY): Payer: Self-pay | Admitting: Cardiology

## 2015-04-29 ENCOUNTER — Other Ambulatory Visit: Payer: Self-pay

## 2015-04-29 DIAGNOSIS — F1310 Sedative, hypnotic or anxiolytic abuse, uncomplicated: Secondary | ICD-10-CM | POA: Insufficient documentation

## 2015-04-29 DIAGNOSIS — F29 Unspecified psychosis not due to a substance or known physiological condition: Secondary | ICD-10-CM | POA: Insufficient documentation

## 2015-04-29 DIAGNOSIS — F1210 Cannabis abuse, uncomplicated: Secondary | ICD-10-CM | POA: Insufficient documentation

## 2015-04-29 DIAGNOSIS — Z880 Allergy status to penicillin: Secondary | ICD-10-CM | POA: Insufficient documentation

## 2015-04-29 DIAGNOSIS — Z792 Long term (current) use of antibiotics: Secondary | ICD-10-CM | POA: Insufficient documentation

## 2015-04-29 DIAGNOSIS — R000 Tachycardia, unspecified: Secondary | ICD-10-CM | POA: Insufficient documentation

## 2015-04-29 DIAGNOSIS — F1910 Other psychoactive substance abuse, uncomplicated: Secondary | ICD-10-CM

## 2015-04-29 DIAGNOSIS — F419 Anxiety disorder, unspecified: Secondary | ICD-10-CM | POA: Insufficient documentation

## 2015-04-29 DIAGNOSIS — Z87891 Personal history of nicotine dependence: Secondary | ICD-10-CM | POA: Insufficient documentation

## 2015-04-29 DIAGNOSIS — F1510 Other stimulant abuse, uncomplicated: Secondary | ICD-10-CM | POA: Insufficient documentation

## 2015-04-29 LAB — CBC
HCT: 37.7 % (ref 36.0–46.0)
Hemoglobin: 12.3 g/dL (ref 12.0–15.0)
MCH: 28.1 pg (ref 26.0–34.0)
MCHC: 32.6 g/dL (ref 30.0–36.0)
MCV: 86.3 fL (ref 78.0–100.0)
Platelets: 187 10*3/uL (ref 150–400)
RBC: 4.37 MIL/uL (ref 3.87–5.11)
RDW: 12.8 % (ref 11.5–15.5)
WBC: 9.3 10*3/uL (ref 4.0–10.5)

## 2015-04-29 LAB — COMPREHENSIVE METABOLIC PANEL
ALBUMIN: 4.4 g/dL (ref 3.5–5.0)
ALT: 12 U/L — ABNORMAL LOW (ref 14–54)
ANION GAP: 10 (ref 5–15)
AST: 18 U/L (ref 15–41)
Alkaline Phosphatase: 33 U/L — ABNORMAL LOW (ref 38–126)
BILIRUBIN TOTAL: 1.9 mg/dL — AB (ref 0.3–1.2)
BUN: 14 mg/dL (ref 6–20)
CHLORIDE: 106 mmol/L (ref 101–111)
CO2: 20 mmol/L — AB (ref 22–32)
CREATININE: 0.88 mg/dL (ref 0.44–1.00)
Calcium: 9.7 mg/dL (ref 8.9–10.3)
GFR calc Af Amer: >60 mL/min (ref >60)
Glucose, Bld: 93 mg/dL (ref 70–99)
Potassium: 3.8 mmol/L (ref 3.5–5.1)
Sodium: 136 mmol/L (ref 135–145)
Total Protein: 7.2 g/dL (ref 6.5–8.1)

## 2015-04-29 LAB — URINALYSIS, ROUTINE W REFLEX MICROSCOPIC
BILIRUBIN URINE: NEGATIVE
Glucose, UA: NEGATIVE mg/dL
HGB URINE DIPSTICK: NEGATIVE
KETONES UR: 40 mg/dL — AB
Nitrite: NEGATIVE
Protein, ur: 30 mg/dL — AB
Specific Gravity, Urine: 1.028 (ref 1.005–1.030)
UROBILINOGEN UA: 0.2 mg/dL (ref 0.0–1.0)
pH: 7.5 (ref 5.0–8.0)

## 2015-04-29 LAB — URINE MICROSCOPIC-ADD ON

## 2015-04-29 LAB — RAPID URINE DRUG SCREEN, HOSP PERFORMED
Amphetamines: POSITIVE — AB
BARBITURATES: NOT DETECTED
Benzodiazepines: POSITIVE — AB
Cocaine: NOT DETECTED
Opiates: NOT DETECTED
TETRAHYDROCANNABINOL: POSITIVE — AB

## 2015-04-29 LAB — ACETAMINOPHEN LEVEL: Acetaminophen (Tylenol), Serum: <10 ug/mL — ABNORMAL LOW (ref 10–30)

## 2015-04-29 LAB — CK: CK TOTAL: 221 U/L (ref 38–234)

## 2015-04-29 LAB — ETHANOL

## 2015-04-29 LAB — SALICYLATE LEVEL: Salicylate Lvl: <4 mg/dL (ref 2.8–30.0)

## 2015-04-29 MED ORDER — DIPHENHYDRAMINE HCL 50 MG/ML IJ SOLN
50.0000 mg | Freq: Once | INTRAMUSCULAR | Status: AC
  Administered 2015-04-29: 50 mg via INTRAMUSCULAR
  Filled 2015-04-29: qty 1

## 2015-04-29 MED ORDER — LORAZEPAM 2 MG/ML IJ SOLN
2.0000 mg | Freq: Once | INTRAMUSCULAR | Status: AC
  Administered 2015-04-29: 2 mg via INTRAMUSCULAR
  Filled 2015-04-29: qty 1

## 2015-04-29 NOTE — ED Notes (Signed)
MOTHER HAS LEFT NOW, SHE HAS A COPY OF THE VISITING HOURS. PATIENT HAS BEEN MEDICATED DUE TO GREAT EMOTIONAL UPSET WITH HER MOTHER LEAVING AND TRYING TO ESCAPE FROM HER ROOM TO GO LOOK FOR HER MOTHER. PATIENT WAS COMPLIANT FOR MEDICATION AND DID NOT HAVE TO BE HELD OR RESTRAINED TO TAKE MEDICATION.
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
## 2015-04-29 NOTE — ED Notes (Signed)
Pt received phone call from husband, pt unable to take call because she is sleeping
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
## 2015-04-29 NOTE — ED Notes (Signed)
Pt on phone at desk

## 2015-04-29 NOTE — ED Provider Notes (Signed)
CSN: [id number]     Arrival date & time 04/29/15  1339 History   First MD Initiated Contact with Patient 04/29/15 1452     Chief Complaint  Patient presents with  . Medical Clearance  . Suicidal     (Consider location/radiation/quality/duration/timing/severity/associated sxs/prior Treatment) The history is provided by the patient.  Krystal Murray is a 24 y.o. female here presenting with visual hallucinations, substance abuse. [place] [facility] [place] and came back. Mother states that she has not been behaving normally that she had [place] other system well in [place]. Patient states that she did take a small pill of molly will she was there. She has some thoughts of homicidal but has no plan. Also states that she is seeing demons are trying to kill her. Denies bugs crawling on her. Mother states that she had something similar previously when she took [place]. Denies hx of psychiatric disorder.   Level V caveat- AMS    Past Medical History  Diagnosis Date  . No pertinent past medical history   . Medical history non-contributory    Past Surgical History  Procedure Laterality Date  . No past surgeries     Family History  Problem Relation Age of Onset  . Cancer Maternal Aunt     uterine  . Diabetes Maternal Aunt   . Hypertension Maternal Aunt   . Cancer Maternal Grandmother     breastt  . Cancer Cousin     leaukemia   History  Substance Use Topics  . Smoking status: Former Smoker    Quit date: 08/29/2011  . Smokeless tobacco: Never Used  . Alcohol Use: No   OB History    Gravida Para Term Preterm AB TAB SAB Ectopic Multiple Living   4 2 2  0 1 1 0 0 0 2     Review of Systems  Psychiatric/Behavioral: Positive for hallucinations. The patient is nervous/anxious.   All other systems reviewed and are negative.     Allergies  Penicillins  Home Medications   Prior to Admission medications   Medication Sig Start Date End Date Taking? Authorizing Provider   acetaminophen (TYLENOL) 325 MG tablet Take 325-650 mg by mouth every 6 (six) hours as needed for mild pain or moderate pain.   Yes Historical Provider, MD  azithromycin (ZITHROMAX) 500 MG tablet Take 2 tablets (1,000 mg total) by mouth once. Patient not taking: Reported on 01/17/2015 09/16/14   Joanna Puffrystal S Dorsey, MD  cephALEXin (KEFLEX) 500 MG capsule Take 1 capsule (500 mg total) by mouth 2 (two) times daily. Patient not taking: Reported on 01/17/2015 09/11/14   Francee PiccoloJennifer Piepenbrink, PA-C  doxycycline (VIBRA-TABS) 100 MG tablet Take 1 tablet (100 mg total) by mouth every 12 (twelve) hours. Patient not taking: Reported on 01/17/2015 09/14/14   Joanna Puffrystal S Dorsey, MD  doxycycline (VIBRAMYCIN) 100 MG capsule Take 1 capsule (100 mg total) by mouth 2 (two) times daily. Patient not taking: Reported on 01/17/2015 09/11/14   Francee PiccoloJennifer Piepenbrink, PA-C  doxycycline (VIBRAMYCIN) 50 MG/5ML SYRP Take 10 mLs (100 mg total) by mouth 2 (two) times daily. Patient not taking: Reported on 04/29/2015 01/17/15   Derwood KaplanAnkit Nanavati, MD  ondansetron (ZOFRAN) 4 MG tablet Take 1 tablet (4 mg total) by mouth every 8 (eight) hours as needed for nausea or vomiting. Patient not taking: Reported on 01/17/2015 09/11/14   Francee PiccoloJennifer Piepenbrink, PA-C  oxyCODONE (OXY IR/ROXICODONE) 5 MG immediate release tablet Take 1 tablet (5 mg total) by mouth every 4 (four)  hours as needed for moderate pain. Patient not taking: Reported on 01/17/2015 09/14/14   Joanna Puffrystal S Dorsey, MD  traMADol (ULTRAM) 50 MG tablet Take 1 tablet (50 mg total) by mouth every 6 (six) hours as needed. Patient not taking: Reported on 04/29/2015 01/17/15   Derwood KaplanAnkit Nanavati, MD   BP 135/75 mmHg  Pulse 115  Temp(Src) 98.7 F (37.1 C)  Resp 18  Wt 145 lb (65.772 kg)  SpO2 100% Physical Exam  Constitutional:  Psychotic, agitated   HENT:  Head: Normocephalic.  Mouth/Throat: Oropharynx is clear and moist.  Eyes: Conjunctivae are normal. Pupils are equal, round, and reactive to light.   Neck: Normal range of motion. Neck supple.  Cardiovascular: Regular rhythm and normal heart sounds.   Tachycardic   Pulmonary/Chest: Effort normal and breath sounds normal. No respiratory distress. She has no wheezes. She has no rales.  Abdominal: Soft. Bowel sounds are normal. She exhibits no distension. There is no tenderness. There is no rebound and no guarding.  Musculoskeletal: Normal range of motion. She exhibits no edema or tenderness.  Neurological: She is alert.  Moving all extremities. Nl strength throughout. No clonus, nl reflexes   Skin: Skin is warm and dry.  Psychiatric:  Agitated. Poor judgment   Nursing note and vitals reviewed.   ED Course  Procedures (including critical care time) Labs Review Labs Reviewed  CBC  ACETAMINOPHEN LEVEL  COMPREHENSIVE METABOLIC PANEL  ETHANOL  SALICYLATE LEVEL  URINE RAPID DRUG SCREEN (HOSP PERFORMED)  URINALYSIS COMPLETEWITH MICROSCOPIC ([facility])   CK  POC URINE PREG, ED    Imaging Review No results found.   EKG Interpretation None       <ECG>  EKG: normal EKG, normal sinus rhythm, there are no previous tracings available for comparison, normal sinus rhythm.  MDM   Final diagnoses:  None    Krystal Murray is a 24 y.o. female here with visual hallucinations, agitation. I think likely effects of amphetamines (molly) vs underlying psych disorder. I doubt serotonin syndrome or NMS but will check CK. I IVC patient and given meds for agitation. Will check labs and monitor closely.   4:32 PM Patient now sleeping. HR down to 80s. Labs sent. Signed out to Dr. Madilyn Hookees to f/u labs and then consult TTS if labs unremarkable.    Richardean Canalavid H Wiliam Cauthorn, MD 04/29/15 [phone]

## 2015-04-29 NOTE — BH Assessment (Addendum)
[facility] Assessment Progress Note      Attempted to assess pt, but she continued to fall asleep throughout the first two questions.  Could not stay awake for the duration of a question or to complete an answer.  Will proceed with next patient and continue to monitor. Spoke with PA Jenn who will remove teh order and reorder when pt is alert.

## 2015-04-30 DIAGNOSIS — F19929 Other psychoactive substance use, unspecified with intoxication, unspecified: Secondary | ICD-10-CM | POA: Insufficient documentation

## 2015-04-30 NOTE — Progress Notes (Addendum)
Patient has been referred for inpatient hospitalization.  Ref[facility]: [facility]  Will await review of referral and follow up with patient.  Deretha EmoryHannah Annalisse Minkoff LCSW, MSW Clinical Social Work: Emergency Room [phone]

## 2015-04-30 NOTE — Progress Notes (Signed)
[facility] at [facility] called to say pt's bed is still not yet available and she will call CSW at [phone] when she can give a time pt can be transported.   Ilean SkillMeghan Osei Anger, MSW, LCSWA Clinical Social Work, Disposition  04/30/2015 [phone]

## 2015-04-30 NOTE — ED Notes (Signed)
Attempted to call Cicero Duckrika- Pt.s mother to inform her of transport but unable to reach.

## 2015-04-30 NOTE — ED Notes (Signed)
Phone given to pt, pts husband on phone. Number put in chart.

## 2015-04-30 NOTE — ED Notes (Addendum)
Pt very tearful stating that she is confused and "not in my right mind, but I am in my right mind. I just don't understand". This RN told the pt what the current plan is and pt stated "I'm sleepy! I just want to sleep!" then rolled over onto her abdomen and continued to cry. Pt sat up and looked into the hall and stated "Who is that?! Who is that watching me?!" There was nobody in the hallway, this RN asked the pt if she was seeing or hearing anything and the pt stated "I don't see anything! I haven't been seeing anything! I just see you, and her (the sitter), and the TV! I'm not crazy!"
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
## 2015-04-30 NOTE — ED Notes (Signed)
EMTALA verified by [place] RN

## 2015-04-30 NOTE — ED Notes (Signed)
[name] called pt. Advised will notify pt when she awakens - [phone]
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
## 2015-04-30 NOTE — ED Notes (Signed)
Pt states that she sees and hears demons and spirits and that they are talking to her. Pt denies SI/HI thoughts at this time. Pt is very tearful and upset and declines any medication.

## 2015-04-30 NOTE — Progress Notes (Signed)
Patient in room crying loudly reporting she does not want to go to facility that they are going to shoot her up. Mother in room with patient, attempting to console and offer emotional support. Patient is very distraught and mother leaves. Mother comes to LCSW very tearful and upset.  LCSW provided emotional support and information about facility and transfer. Mother reports she does not know what her daughter has been doing and her 2 children have been in her care for the last 2-3 years.  Mother reports patient has been very impulsive and demonstrated risky behavior with strip dancing, illegal substances, and multipe friends.  She reports she does not pay her bills and her lights/water are close to being turned off.  Mother reports feeling overwhelmed and upset about situation. LCSW gives information about emergency custody for patient's children and encourages mother to slow down and just focus on her daughter (patient) getting help and stability. Mother calmed down and was agreeable.  Aware of [facility] and will be contacted when [facility].  Krystal EmoryHannah Iyari Hagner LCSW, MSW Clinical Social Work: Emergency Room [phone]

## 2015-04-30 NOTE — ED Notes (Signed)
TTS completed. 

## 2015-04-30 NOTE — Progress Notes (Signed)
Patient has been accepted to [facility]. Accepting MD: Dr. Bethann PunchesIkwechegh Room: 2570 Bed 2 Report to be called:    Awaiting bed to become available.  If another bed opens earlier than one listed above, patient will get that room and report number will be given.    Awaiting call back from [facility] before [facility] is notified.  Krystal EmoryHannah Sunita Demond LCSW, MSW Clinical Social Work: Emergency Room [phone]

## 2015-04-30 NOTE — Progress Notes (Signed)
[place] [facility] to have [facility] transported. No one to move patient at this time.  Transport will most likely be later this afternoon.  Patient mother to be called once patient moved.   Patient calm at this time no barriers to DC. [facility].  Copies at RN station for transport.   Krystal EmoryHannah Parilee Hally LCSW, MSW Clinical Social Work: Emergency Room [phone]

## 2015-04-30 NOTE — Progress Notes (Signed)
[facility] to say [facility]'s bed is ready. Call report to #[phone]. Dr. Bethann PunchesIkwechegh accepting to bed 2570-2.  Spoke with [facility] RN regarding pt's disposition.  Ilean SkillMeghan Kabrea Seeney, MSW, LCSWA Clinical Social Work, Disposition  04/30/2015 [phone]

## 2015-04-30 NOTE — ED Notes (Signed)
Pt walked into hallway and walked to room 21 and attempted to start talking to that pt. Pt was advised she needed to return to her room, before doing so she asked the pt (in room 21) "what's wrong? Are you ok?" Pt was again notified that she needed to stop talking to the pt (in room 21) and return to her own room.

## 2015-04-30 NOTE — ED Notes (Signed)
Pt ambulated to phone to call family

## 2015-04-30 NOTE — ED Notes (Signed)
Called [facility] counselors, they are aware that pt is awake now to continue TTS

## 2015-04-30 NOTE — ED Notes (Signed)
Pt reports seeing "demons in side of people" pt is tearful

## 2015-04-30 NOTE — ED Notes (Addendum)
Sitter notified this RN that the pt stated that "there are people in the world that beat the babies, and that she was sent here by god to protect the babies, and that she sees demons in people, but that she also sees good in people". Sitter states that the pt has not made any statements about hurting herself or anyone else.

## 2015-04-30 NOTE — ED Notes (Signed)
Dr. Pecola Leisureeese notified that pt is hallucinating and sobbing. Asked to call back if pt is indicating that she wishes to harm herself or anyone else.

## 2016-06-22 ENCOUNTER — Encounter (HOSPITAL_COMMUNITY): Payer: Self-pay | Admitting: *Deleted

## 2016-06-22 ENCOUNTER — Inpatient Hospital Stay (HOSPITAL_COMMUNITY)
Admission: AD | Admit: 2016-06-22 | Discharge: 2016-06-22 | Disposition: A | Discharge status: Home / Self Care | Payer: Medicaid Other | Source: Ambulatory Visit | Attending: Obstetrics & Gynecology | Admitting: Obstetrics & Gynecology

## 2016-06-22 DIAGNOSIS — A599 Trichomoniasis, unspecified: Secondary | ICD-10-CM

## 2016-06-22 DIAGNOSIS — A5901 Trichomonal vulvovaginitis: Secondary | ICD-10-CM | POA: Insufficient documentation

## 2016-06-22 DIAGNOSIS — B9689 Other specified bacterial agents as the cause of diseases classified elsewhere: Secondary | ICD-10-CM

## 2016-06-22 DIAGNOSIS — K5900 Constipation, unspecified: Secondary | ICD-10-CM | POA: Insufficient documentation

## 2016-06-22 DIAGNOSIS — Z87891 Personal history of nicotine dependence: Secondary | ICD-10-CM | POA: Insufficient documentation

## 2016-06-22 DIAGNOSIS — Z880 Allergy status to penicillin: Secondary | ICD-10-CM | POA: Insufficient documentation

## 2016-06-22 DIAGNOSIS — Z3202 Encounter for pregnancy test, result negative: Secondary | ICD-10-CM | POA: Insufficient documentation

## 2016-06-22 DIAGNOSIS — K5901 Slow transit constipation: Secondary | ICD-10-CM

## 2016-06-22 DIAGNOSIS — N760 Acute vaginitis: Secondary | ICD-10-CM | POA: Insufficient documentation

## 2016-06-22 HISTORY — DX: Urinary tract infection, site not specified: N39.0

## 2016-06-22 LAB — URINALYSIS, ROUTINE W REFLEX MICROSCOPIC
Bilirubin Urine: NEGATIVE
Glucose, UA: NEGATIVE mg/dL
Hgb urine dipstick: NEGATIVE
Ketones, ur: 15 mg/dL — AB
Leukocytes, UA: NEGATIVE
Nitrite: NEGATIVE
PROTEIN: NEGATIVE mg/dL
SPECIFIC GRAVITY, URINE: 1.025 (ref 1.005–1.030)
pH: 6 (ref 5.0–8.0)

## 2016-06-22 LAB — HIV ANTIBODY (ROUTINE TESTING W REFLEX): HIV Screen 4th Generation wRfx: NONREACTIVE

## 2016-06-22 LAB — RPR: RPR Ser Ql: NONREACTIVE

## 2016-06-22 LAB — GC/CHLAMYDIA PROBE AMP (~~LOC~~) NOT AT ARMC
Chlamydia: NEGATIVE
NEISSERIA GONORRHEA: NEGATIVE

## 2016-06-22 LAB — WET PREP, GENITAL
Sperm: NONE SEEN
YEAST WET PREP: NONE SEEN

## 2016-06-22 LAB — POCT PREGNANCY, URINE: Preg Test, Ur: NEGATIVE

## 2016-06-22 MED ORDER — KETOROLAC TROMETHAMINE 60 MG/2ML IM SOLN
60.0000 mg | Freq: Once | INTRAMUSCULAR | Status: AC
  Administered 2016-06-22: 60 mg via INTRAMUSCULAR
  Filled 2016-06-22: qty 2

## 2016-06-22 MED ORDER — METRONIDAZOLE 500 MG PO TABS: 500.0000 mg | ORAL_TABLET | Freq: Two times a day (BID) | ORAL | Status: DC

## 2016-06-22 MED ORDER — METRONIDAZOLE 500 MG PO TABS
2000.0000 mg | ORAL_TABLET | Freq: Once | ORAL | Status: AC
  Administered 2016-06-22: 2000 mg via ORAL
  Filled 2016-06-22: qty 4

## 2016-06-22 NOTE — MAU Provider Note (Signed)
History     CSN: [id number]  Arrival date and time: 06/22/16 0044   First Provider Initiated Contact with Patient 06/22/16 0143      Chief Complaint  Patient presents with  . Abdominal Pain   HPI Ms. Zeffie Donell SievertU Raburn is a 25 y.o. [phone] who presents to MAU today with complaint of abdominal pain and spotting today. She states LMP 05/23/16. She states last sex in May and did not use condoms. She states abdominal pain is lower abdomen and rated at 9/10 now. She has not taken anything for pain. She denies UTI symptoms, abnormal discharge, fever or N/V/D. She has had constipation since yesterday, last BM 2 days ago.   OB History    Gravida Para Term Preterm AB TAB SAB Ectopic Multiple Living   4 2 2  0 1 1 0 0 0 2      Past Medical History  Diagnosis Date  . No pertinent past medical history   . Medical history non-contributory   . UTI (lower urinary tract infection)     Past Surgical History  Procedure Laterality Date  . No past surgeries      Family History  Problem Relation Age of Onset  . Cancer Maternal Aunt     uterine  . Diabetes Maternal Aunt   . Hypertension Maternal Aunt   . Cancer Maternal Grandmother     breastt  . Cancer Cousin     leaukemia    Social History  Substance Use Topics  . Smoking status: Former Smoker    Quit date: 08/29/2011  . Smokeless tobacco: Never Used  . Alcohol Use: No    Allergies:  Allergies  Allergen Reactions  . Penicillins Other (See Comments)    Unknown, childhood allergy    Prescriptions prior to admission  Medication Sig Dispense Refill Last Dose  . acetaminophen (TYLENOL) 325 MG tablet Take 325-650 mg by mouth every 6 (six) hours as needed for mild pain or moderate pain.   Unknown at Unknown time  . azithromycin (ZITHROMAX) 500 MG tablet Take 2 tablets (1,000 mg total) by mouth once. (Patient not taking: Reported on 01/17/2015) 2 tablet 0 Not Taking at Unknown time  . cephALEXin (KEFLEX) 500 MG capsule Take 1 capsule  (500 mg total) by mouth 2 (two) times daily. (Patient not taking: Reported on 01/17/2015) 20 capsule 0 Not Taking at Unknown time  . doxycycline (VIBRA-TABS) 100 MG tablet Take 1 tablet (100 mg total) by mouth every 12 (twelve) hours. (Patient not taking: Reported on 01/17/2015) 28 tablet 0 Not Taking at Unknown time  . doxycycline (VIBRAMYCIN) 100 MG capsule Take 1 capsule (100 mg total) by mouth 2 (two) times daily. (Patient not taking: Reported on 01/17/2015) 20 capsule 0 Not Taking at Unknown time  . doxycycline (VIBRAMYCIN) 50 MG/5ML SYRP Take 10 mLs (100 mg total) by mouth 2 (two) times daily. (Patient not taking: Reported on 04/29/2015) 200 mL 0 Completed Course at Unknown time  . ondansetron (ZOFRAN) 4 MG tablet Take 1 tablet (4 mg total) by mouth every 8 (eight) hours as needed for nausea or vomiting. (Patient not taking: Reported on 01/17/2015) 10 tablet 0 Completed Course at Unknown time  . oxyCODONE (OXY IR/ROXICODONE) 5 MG immediate release tablet Take 1 tablet (5 mg total) by mouth every 4 (four) hours as needed for moderate pain. (Patient not taking: Reported on 01/17/2015) 30 tablet 0 Completed Course at Unknown time  . traMADol (ULTRAM) 50 MG tablet Take 1 tablet (50  mg total) by mouth every 6 (six) hours as needed. (Patient not taking: Reported on 04/29/2015) 15 tablet 0 Completed Course at Unknown time    Review of Systems  Constitutional: Negative for fever and malaise/fatigue.  Gastrointestinal: Positive for abdominal pain and constipation. Negative for nausea, vomiting and diarrhea.  Genitourinary: Negative for dysuria, urgency and frequency.       + vaginal discharge, spotting   Physical Exam   Last menstrual period 05/23/2016, unknown if currently breastfeeding.  Physical Exam  Nursing note and vitals reviewed. Constitutional: She is oriented to person, place, and time. She appears well-developed and well-nourished. No distress.  HENT:  Head: Normocephalic and atraumatic.   Cardiovascular: Normal rate.   Respiratory: Effort normal.  GI: Soft. She exhibits no distension and no mass. There is tenderness (very mild tendernss to palpation of the LLQ). There is no rebound and no guarding.  Genitourinary: Uterus is not enlarged and not tender. Cervix exhibits no motion tenderness, no discharge and no friability. Right adnexum displays no mass and no tenderness. Left adnexum displays no mass and no tenderness. No bleeding in the vagina. Vaginal discharge (small amount of thin, white discharge noted) found.  Neurological: She is alert and oriented to person, place, and time.  Skin: Skin is warm and dry. No erythema.  Psychiatric: She has a normal mood and affect.    Results for orders placed or performed during the hospital encounter of 06/22/16 (from the past 24 hour(s))  Pregnancy, urine POC     Status: None   Collection Time: 06/22/16  1:25 AM  Result Value Ref Range   Preg Test, Ur NEGATIVE NEGATIVE  Urinalysis, Routine w reflex microscopic (not at [facility])     Status: Abnormal   Collection Time: 06/22/16  1:30 AM  Result Value Ref Range   Color, Urine YELLOW YELLOW   APPearance CLEAR CLEAR   Specific Gravity, Urine 1.025 1.005 - 1.030   pH 6.0 5.0 - 8.0   Glucose, UA NEGATIVE NEGATIVE mg/dL   Hgb urine dipstick NEGATIVE NEGATIVE   Bilirubin Urine NEGATIVE NEGATIVE   Ketones, ur 15 (A) NEGATIVE mg/dL   Protein, ur NEGATIVE NEGATIVE mg/dL   Nitrite NEGATIVE NEGATIVE   Leukocytes, UA NEGATIVE NEGATIVE  Wet prep, genital     Status: Abnormal   Collection Time: 06/22/16  1:45 AM  Result Value Ref Range   Yeast Wet Prep HPF POC NONE SEEN NONE SEEN   Trich, Wet Prep PRESENT (A) NONE SEEN   Clue Cells Wet Prep HPF POC PRESENT (A) NONE SEEN   WBC, Wet Prep HPF POC MODERATE (A) NONE SEEN   Sperm NONE SEEN     MAU Course  Procedures None   MDM UPT - negative UA, wet prep, GC/Chlamydia, RPR and HIV today  2 G Flagyl in MAU for trichomonas to ensure adequate  treatment.  Assessment and Plan  A: Constipation  Trichomonas Bacterial vaginosis  P: Discharge home Rx for Flagyl given to patient  Warning signs for worsening condition discussed Patient treated for Trichomonas in MAU. Partner treatment advised.  Patient advised to follow-up with [facility] as needed for routine GYN care Patient may return to MAU as needed or if her condition were to change or worsen   Marny LowensteinJulie N Wah Sabic, PA-C  06/22/2016, 2:21 AM

## 2016-06-22 NOTE — MAU Note (Signed)
Pt c/o low abd pain for the past couple days accompanied by spotting three days ago. LMP 23 May 2016.

## 2016-06-22 NOTE — Discharge Instructions (Signed)
Bacterial Vaginosis Bacterial vaginosis is an infection of the vagina. It happens when too many germs (bacteria) grow in the vagina. Having this infection puts you at risk for getting other infections from sex. Treating this infection can help lower your risk for other infections, such as:   Chlamydia.  Gonorrhea.  HIV.  Herpes. HOME CARE  Take your medicine as told by your doctor.  Finish your medicine even if you start to feel better.  Tell your sex partner that you have an infection. They should see their doctor for treatment.  During treatment:  Avoid sex or use condoms correctly.  Do not douche.  Do not drink alcohol unless your doctor tells you it is ok.  Do not breastfeed unless your doctor tells you it is ok. GET HELP IF:  You are not getting better after 3 days of treatment.  You have more grey fluid (discharge) coming from your vagina than before.  You have more pain than before.  You have a fever. MAKE SURE YOU:   Understand these instructions.  Will watch your condition.  Will get help right away if you are not doing well or get worse.   This information is not intended to replace advice given to you by your health care provider. Make sure you discuss any questions you have with your health care provider.   Document Released: 09/15/2008 Document Revised: 12/28/2014 Document Reviewed: 07/19/2013 Elsevier Interactive Patient Education 2016 [employer] Trichomoniasis Trichomoniasis is an infection caused by an organism called Trichomonas. The infection can affect both women and men. In women, the outer female genitalia and the vagina are affected. In men, the penis is mainly affected, but the prostate and other reproductive organs can also be involved. Trichomoniasis is a sexually transmitted infection (STI) and is most often passed to another person through sexual contact.  RISK FACTORS  Having unprotected sexual intercourse.  Having sexual  intercourse with an infected partner. SIGNS AND SYMPTOMS  Symptoms of trichomoniasis in women include:  Abnormal gray-green frothy vaginal discharge.  Itching and irritation of the vagina.  Itching and irritation of the area outside the vagina. Symptoms of trichomoniasis in men include:   Penile discharge with or without pain.  Pain during urination. This results from inflammation of the urethra. DIAGNOSIS  Trichomoniasis may be found during a Pap test or physical exam. Your health care provider may use one of the following methods to help diagnose this infection:  Testing the pH of the vagina with a test tape.  Using a vaginal swab test that checks for the Trichomonas organism. A test is available that provides results within a few minutes.  Examining a urine sample.  Testing vaginal secretions. Your health care provider may test you for other STIs, including HIV. TREATMENT   You may be given medicine to fight the infection. Women should inform their health care provider if they could be or are pregnant. Some medicines used to treat the infection should not be taken during pregnancy.  Your health care provider may recommend over-the-counter medicines or creams to decrease itching or irritation.  Your sexual partner will need to be treated if infected.  Your health care provider may test you for infection again 3 months after treatment. HOME CARE INSTRUCTIONS   Take medicines only as directed by your health care provider.  Take over-the-counter medicine for itching or irritation as directed by your health care provider.  Do not have sexual intercourse while you have the infection.  Women should  not douche or wear tampons while they have the infection.  Discuss your infection with your partner. Your partner may have gotten the infection from you, or you may have gotten it from your partner.  Have your sex partner get examined and treated if necessary.  Practice safe,  informed, and protected sex.  See your health care provider for other STI testing. SEEK MEDICAL CARE IF:   You still have symptoms after you finish your medicine.  You develop abdominal pain.  You have pain when you urinate.  You have bleeding after sexual intercourse.  You develop a rash.  Your medicine makes you sick or makes you throw up (vomit). MAKE SURE YOU:  Understand these instructions.  Will watch your condition.  Will get help right away if you are not doing well or get worse.   This information is not intended to replace advice given to you by your health care provider. Make sure you discuss any questions you have with your health care provider.   Document Released: 06/02/2001 Document Revised: 12/28/2014 Document Reviewed: 09/18/2013 Elsevier Interactive Patient Education [employer] Elsevier Inc.

## 2016-07-19 ENCOUNTER — Encounter (HOSPITAL_COMMUNITY): Payer: Self-pay | Admitting: *Deleted

## 2016-07-19 ENCOUNTER — Inpatient Hospital Stay (HOSPITAL_COMMUNITY)
Admission: AD | Admit: 2016-07-19 | Discharge: 2016-07-19 | Disposition: A | Discharge status: Home / Self Care | Payer: Medicaid Other | Source: Ambulatory Visit | Attending: Family Medicine | Admitting: Family Medicine

## 2016-07-19 DIAGNOSIS — B3731 Acute candidiasis of vulva and vagina: Secondary | ICD-10-CM

## 2016-07-19 DIAGNOSIS — Z79899 Other long term (current) drug therapy: Secondary | ICD-10-CM | POA: Insufficient documentation

## 2016-07-19 DIAGNOSIS — B373 Candidiasis of vulva and vagina: Secondary | ICD-10-CM

## 2016-07-19 DIAGNOSIS — Z87891 Personal history of nicotine dependence: Secondary | ICD-10-CM | POA: Insufficient documentation

## 2016-07-19 DIAGNOSIS — Z880 Allergy status to penicillin: Secondary | ICD-10-CM | POA: Insufficient documentation

## 2016-07-19 DIAGNOSIS — B009 Herpesviral infection, unspecified: Secondary | ICD-10-CM

## 2016-07-19 LAB — WET PREP, GENITAL
Sperm: NONE SEEN
Trich, Wet Prep: NONE SEEN
Yeast Wet Prep HPF POC: NONE SEEN

## 2016-07-19 LAB — URINALYSIS, ROUTINE W REFLEX MICROSCOPIC
Bilirubin Urine: NEGATIVE
GLUCOSE, UA: NEGATIVE mg/dL
KETONES UR: NEGATIVE mg/dL
NITRITE: NEGATIVE
PROTEIN: 30 mg/dL — AB
Specific Gravity, Urine: >1.03 — ABNORMAL HIGH (ref 1.005–1.030)
pH: 6 (ref 5.0–8.0)

## 2016-07-19 LAB — URINE MICROSCOPIC-ADD ON

## 2016-07-19 LAB — POCT PREGNANCY, URINE: Preg Test, Ur: NEGATIVE

## 2016-07-19 MED ORDER — FLUCONAZOLE 150 MG PO TABS: 150.0000 mg | ORAL_TABLET | Freq: Every day | ORAL | 0 refills | Status: DC

## 2016-07-19 MED ORDER — ACYCLOVIR 400 MG PO TABS: 400.0000 mg | ORAL_TABLET | Freq: Three times a day (TID) | ORAL | 0 refills | Status: DC

## 2016-07-19 NOTE — Progress Notes (Signed)
Wet prep collected with Qtip swab

## 2016-07-19 NOTE — MAU Note (Signed)
Krystal Nipple PA in to see pt. HSV culture obtained and sent

## 2016-07-19 NOTE — MAU Provider Note (Signed)
History     CSN: [id number]  Arrival date and time: 07/19/16 0406   First Provider Initiated Contact with Patient 07/19/16 [phone]         Chief Complaint  Patient presents with  . Vaginitis   HPI [name] is a 25 y.o. [phone] who presents to MAU today with complaint of vaginal discharge, itching and irritation. The patient was seen in MAU a few weeks ago and given Flagyl for BV. She states now 2-3 days of itching and irritation like a yeast infection. She also states a small bumps on her labia x 2-3 days as well. She states that they were draining clear liquid, but that has stopped. She denies history of HSV, no new partners, no fever or vaginal bleeding.   OB History    Gravida Para Term Preterm AB Living   0 1 2   SAB TAB Ectopic Multiple Live Births   0 1 0 0        Past Medical History:  Diagnosis Date  . Medical history non-contributory   . No pertinent past medical history   . UTI (lower urinary tract infection)     Past Surgical History:  Procedure Laterality Date  . NO PAST SURGERIES      Family History  Problem Relation Age of Onset  . Cancer Maternal Aunt     uterine  . Diabetes Maternal Aunt   . Hypertension Maternal Aunt   . Cancer Maternal Grandmother     breastt  . Cancer Cousin     leaukemia    Social History  Substance Use Topics  . Smoking status: Former Smoker    Quit date: 08/29/2011  . Smokeless tobacco: Never Used  . Alcohol use No    Allergies:  Allergies  Allergen Reactions  . Penicillins Other (See Comments)    Unknown, childhood allergy    Prescriptions Prior to Admission  Medication Sig Dispense Refill Last Dose  . acetaminophen (TYLENOL) 325 MG tablet Take 325-650 mg by mouth every 6 (six) hours as needed for mild pain or moderate pain.   Past Month at Unknown time  . metroNIDAZOLE (FLAGYL) 500 MG tablet Take 1 tablet (500 mg total) by mouth 2 (two) times daily. 14 tablet 0   . ondansetron (ZOFRAN) 4 MG  tablet Take 1 tablet (4 mg total) by mouth every 8 (eight) hours as needed for nausea or vomiting. (Patient not taking: Reported on 01/17/2015) 10 tablet 0 Completed Course at Unknown time  . oxyCODONE (OXY IR/ROXICODONE) 5 MG immediate release tablet Take 1 tablet (5 mg total) by mouth every 4 (four) hours as needed for moderate pain. (Patient not taking: Reported on 01/17/2015) 30 tablet 0 Completed Course at Unknown time  . traMADol (ULTRAM) 50 MG tablet Take 1 tablet (50 mg total) by mouth every 6 (six) hours as needed. (Patient not taking: Reported on 04/29/2015) 15 tablet 0 Completed Course at Unknown time    Review of Systems  Constitutional: Negative for fever and malaise/fatigue.  Gastrointestinal: Negative for abdominal pain.  Genitourinary:       Neg - vaginal bleeding + vaginal discharge, sores, irritation, itching   Physical Exam   Blood pressure 108/59, pulse 82, temperature 98.4 F (36.9 C), resp. rate 18, height  (1.651 m), weight 180 lb 9.6 oz (81.9 kg), last menstrual period 06/25/2016, unknown if currently breastfeeding.  Physical Exam  Nursing note and vitals reviewed. Constitutional: She is oriented to person,  place, and time. She appears well-developed and well-nourished. No distress.  HENT:  Head: Normocephalic and atraumatic.  Cardiovascular: Normal rate.   Respiratory: Effort normal.  GI: Soft. She exhibits no distension.  Genitourinary: There is lesion (multiple small ulcerations with surrounding erythema and scant clear drainage noted) on the right labia. There is lesion on the left labia. No bleeding in the vagina. Vaginal discharge found.  Neurological: She is alert and oriented to person, place, and time.  Skin: Skin is warm and dry. No erythema.  Psychiatric: She has a normal mood and affect.    Results for orders placed or performed during the hospital encounter of 07/19/16 (from the past 24 hour(s))  Urinalysis, Routine w reflex microscopic (not at  [facility])     Status: Abnormal   Collection Time: 07/19/16  4:32 AM  Result Value Ref Range   Color, Urine YELLOW YELLOW   APPearance CLEAR CLEAR   Specific Gravity, Urine >1.030 (H) 1.005 - 1.030   pH 6.0 5.0 - 8.0   Glucose, UA NEGATIVE NEGATIVE mg/dL   Hgb urine dipstick TRACE (A) NEGATIVE   Bilirubin Urine NEGATIVE NEGATIVE   Ketones, ur NEGATIVE NEGATIVE mg/dL   Protein, ur 30 (A) NEGATIVE mg/dL   Nitrite NEGATIVE NEGATIVE   Leukocytes, UA TRACE (A) NEGATIVE  Urine microscopic-add on     Status: Abnormal   Collection Time: 07/19/16  4:32 AM  Result Value Ref Range   Squamous Epithelial / LPF 0-5 (A) NONE SEEN   WBC, UA 0-5 0 - 5 WBC/hpf   RBC / HPF 0-5 0 - 5 RBC/hpf   Bacteria, UA FEW (A) NONE SEEN   Urine-Other MUCOUS PRESENT   Pregnancy, urine POC     Status: None   Collection Time: 07/19/16  4:42 AM  Result Value Ref Range   Preg Test, Ur NEGATIVE NEGATIVE  Wet prep, genital     Status: Abnormal   Collection Time: 07/19/16  4:50 AM  Result Value Ref Range   Yeast Wet Prep HPF POC NONE SEEN NONE SEEN   Trich, Wet Prep NONE SEEN NONE SEEN   Clue Cells Wet Prep HPF POC PRESENT (A) NONE SEEN   WBC, Wet Prep HPF POC MODERATE (A) NONE SEEN   Sperm NONE SEEN     MAU Course  Procedures None  MDM UPT - negative Wet prep and HSV culture today   Assessment and Plan  A: Yeast vulvovaginitis, clinical 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

## 2016-07-19 NOTE — Progress Notes (Signed)
Vonzella Nipple PA notified of pt's admission and status. RN will collect wet prep and PA will see pt

## 2016-07-19 NOTE — MAU Note (Addendum)
Feel like I have yeast infection for 2-3 days. Then I got a pump that was itchy and tingling when I pee. Some yellow d/c. No odor. Knot in R groin area that is sore sometimes. Recently took antibiotics for BV

## 2016-07-19 NOTE — Progress Notes (Signed)
Julie Wenzel PA in earlier to discuss test results and d/c plan with pt. Written and verbal d/c instructions given and understanding voiced 

## 2016-07-19 NOTE — Discharge Instructions (Signed)
Monilial Vaginitis Vaginitis in a soreness, swelling and redness (inflammation) of the vagina and vulva. Monilial vaginitis is not a sexually transmitted infection. CAUSES  Yeast vaginitis is caused by yeast (candida) that is normally found in your vagina. With a yeast infection, the candida has overgrown in number to a point that upsets the chemical balance. SYMPTOMS   White, thick vaginal discharge.  Swelling, itching, redness and irritation of the vagina and possibly the lips of the vagina (vulva).  Burning or painful urination.  Painful intercourse. DIAGNOSIS  Things that may contribute to monilial vaginitis are:  Postmenopausal and virginal states.  Pregnancy.  Infections.  Being tired, sick or stressed, especially if you had monilial vaginitis in the past.  Diabetes. Good control will help lower the chance.  Birth control pills.  Tight fitting garments.  Using bubble bath, feminine sprays, douches or deodorant tampons.  Taking certain medications that kill germs (antibiotics).  Sporadic recurrence can occur if you become ill. TREATMENT  Your caregiver will give you medication.  There are several kinds of anti monilial vaginal creams and suppositories specific for monilial vaginitis. For recurrent yeast infections, use a suppository or cream in the vagina 2 times a week, or as directed.  Anti-monilial or steroid cream for the itching or irritation of the vulva may also be used. Get your caregiver's permission.  Painting the vagina with methylene blue solution may help if the monilial cream does not work.  Eating yogurt may help prevent monilial vaginitis. HOME CARE INSTRUCTIONS   Finish all medication as prescribed.  Do not have sex until treatment is completed or after your caregiver tells you it is okay.  Take warm sitz baths.  Do not douche.  Do not use tampons, especially scented ones.  Wear cotton underwear.  Avoid tight pants and panty  hose.  Tell your sexual partner that you have a yeast infection. They should go to their caregiver if they have symptoms such as mild rash or itching.  Your sexual partner should be treated as well if your infection is difficult to eliminate.  Practice safer sex. Use condoms.  Some vaginal medications cause latex condoms to fail. Vaginal medications that harm condoms are:  Cleocin cream.  Butoconazole (Femstat).  Terconazole (Terazol) vaginal suppository.  Miconazole (Monistat) (may be purchased over the counter). SEEK MEDICAL CARE IF:   You have a temperature by mouth above 102 F (38.9 C).  The infection is getting worse after 2 days of treatment.  The infection is not getting better after 3 days of treatment.  You develop blisters in or around your vagina.  You develop vaginal bleeding, and it is not your menstrual period.  You have pain when you urinate.  You develop intestinal problems.  You have pain with sexual intercourse.   This information is not intended to replace advice given to you by your health care provider. Make sure you discuss any questions you have with your health care provider.   Document Released: 09/16/2005 Document Revised: 02/29/2012 Document Reviewed: 06/10/2015 Elsevier Interactive Patient Education 2016 [employer] Inc[employer] Genital Herpes Genital herpes is a common sexually transmitted infection (STI) that is caused by a virus. The virus is spread from person to person through sexual contact. Infection can cause itching, blisters, and sores in the genital area or rectal area. This is called an outbreak. It affects both men and women. Genital herpes is particularly concerning for pregnant women because the virus can be passed to the baby during delivery and cause  serious problems. Genital herpes is also a concern for people with a weakened defense (immune) system. Symptoms of genital herpes may last several days and then go away. However, the virus  remains in your body, so you may have more outbreaks of symptoms in the future. The time between outbreaks varies and can be months or years. CAUSES Genital herpes is caused by a virus called herpes simplex virus (HSV) type 2 or HSV type 1. These viruses are contagious and are most often spread through sexual contact with an infected person. Sexual contact includes vaginal, anal, and oral sex. RISK FACTORS Risk factors for genital herpes include:  Being sexually active with multiple partners.  Having unprotected sex. SIGNS AND SYMPTOMS Symptoms may include:  Pain and itching in the genital area or rectal area.  Small red bumps that turn into blisters and then turn into sores.  Flu-like symptoms, including:  Fever.  Body aches.  Painful urination.  Vaginal discharge. DIAGNOSIS Genital herpes may be diagnosed by:  Physical exam.  Blood test.  Fluid culture test from an open sore. TREATMENT There is no cure for genital herpes. Oral antiviral medicines may be used to speed up healing and to help prevent the return of symptoms. These medicines can also help to reduce the spread of the virus to sexual partners. HOME CARE INSTRUCTIONS  Keep the affected areas dry and clean.  Take medicines only as directed by your health care provider.  Do not have sexual contact during active infections. Genital herpes is contagious.  Practice safe sex. Latex condoms and female condoms may help to prevent the spread of the herpes virus.  Avoid rubbing or touching the blisters and sores. If you do touch the blister or sores:  Wash your hands thoroughly.  Do not touch your eyes afterward.  If you become pregnant, tell your health care provider if you have had genital herpes.  Keep all follow-up visits as directed by your health care provider. This is important. PREVENTION  Use condoms. Although anyone can contract genital herpes during sexual contact even with the use of a condom, a  condom can provide some protection.  Avoid having multiple sexual partners.  Talk to your sexual partner about any symptoms and past history that either of you may have.  Get tested before you have sex. Ask your partner to do the same.  Recognize the symptoms of genital herpes. Do not have sexual contact if you notice these symptoms. SEEK MEDICAL CARE IF:  Your symptoms are not improving with medicine.  Your symptoms return.  You have new symptoms.  You have a fever.  You have abdominal pain.  You have redness, swelling, or pain in your eye. MAKE SURE YOU:  Understand these instructions.  Will watch your condition.  Will get help right away if you are not doing well or get worse.   This information is not intended to replace advice given to you by your health care provider. Make sure you discuss any questions you have with your health care provider.   Document Released: 12/04/2000 Document Revised: 12/28/2014 Document Reviewed: 04/24/2014 Elsevier Interactive Patient Education [employer]

## 2016-07-20 LAB — HERPES SIMPLEX VIRUS(HSV) DNA BY PCR
HSV 1 DNA: NEGATIVE
HSV 2 DNA: POSITIVE — AB

## 2017-06-03 ENCOUNTER — Emergency Department (HOSPITAL_COMMUNITY)
Admission: EM | Admit: 2017-06-03 | Discharge: 2017-06-03 | Disposition: A | Discharge status: Home / Self Care | Payer: Medicaid Other | Attending: Emergency Medicine | Admitting: Emergency Medicine

## 2017-06-03 ENCOUNTER — Emergency Department (HOSPITAL_COMMUNITY): Payer: Medicaid Other

## 2017-06-03 ENCOUNTER — Encounter (HOSPITAL_COMMUNITY): Payer: Self-pay

## 2017-06-03 DIAGNOSIS — Z3490 Encounter for supervision of normal pregnancy, unspecified, unspecified trimester: Secondary | ICD-10-CM

## 2017-06-03 DIAGNOSIS — Z3A08 8 weeks gestation of pregnancy: Secondary | ICD-10-CM | POA: Diagnosis not present

## 2017-06-03 DIAGNOSIS — O0281 Inappropriate change in quantitative human chorionic gonadotropin (hCG) in early pregnancy: Secondary | ICD-10-CM | POA: Insufficient documentation

## 2017-06-03 DIAGNOSIS — Z87891 Personal history of nicotine dependence: Secondary | ICD-10-CM | POA: Diagnosis not present

## 2017-06-03 DIAGNOSIS — O219 Vomiting of pregnancy, unspecified: Secondary | ICD-10-CM | POA: Insufficient documentation

## 2017-06-03 DIAGNOSIS — Z3A01 Less than 8 weeks gestation of pregnancy: Secondary | ICD-10-CM

## 2017-06-03 DIAGNOSIS — O23591 Infection of other part of genital tract in pregnancy, first trimester: Secondary | ICD-10-CM | POA: Diagnosis not present

## 2017-06-03 DIAGNOSIS — R112 Nausea with vomiting, unspecified: Secondary | ICD-10-CM

## 2017-06-03 DIAGNOSIS — A599 Trichomoniasis, unspecified: Secondary | ICD-10-CM | POA: Insufficient documentation

## 2017-06-03 LAB — COMPREHENSIVE METABOLIC PANEL
ALT: 8 U/L — ABNORMAL LOW (ref 14–54)
ANION GAP: 7 (ref 5–15)
AST: 16 U/L (ref 15–41)
Albumin: 3.8 g/dL (ref 3.5–5.0)
Alkaline Phosphatase: 32 U/L — ABNORMAL LOW (ref 38–126)
BUN: 9 mg/dL (ref 6–20)
CHLORIDE: 107 mmol/L (ref 101–111)
CO2: 21 mmol/L — ABNORMAL LOW (ref 22–32)
Calcium: 9 mg/dL (ref 8.9–10.3)
Creatinine, Ser: 0.66 mg/dL (ref 0.44–1.00)
Glucose, Bld: 108 mg/dL — ABNORMAL HIGH (ref 65–99)
POTASSIUM: 3.6 mmol/L (ref 3.5–5.1)
Sodium: 135 mmol/L (ref 135–145)
TOTAL PROTEIN: 6.7 g/dL (ref 6.5–8.1)
Total Bilirubin: 1 mg/dL (ref 0.3–1.2)

## 2017-06-03 LAB — CBC
HEMATOCRIT: 34.8 % — AB (ref 36.0–46.0)
HEMOGLOBIN: 11 g/dL — AB (ref 12.0–15.0)
MCH: 26.7 pg (ref 26.0–34.0)
MCHC: 31.6 g/dL (ref 30.0–36.0)
MCV: 84.5 fL (ref 78.0–100.0)
Platelets: 194 10*3/uL (ref 150–400)
RBC: 4.12 MIL/uL (ref 3.87–5.11)
RDW: 13.8 % (ref 11.5–15.5)
WBC: 6.4 10*3/uL (ref 4.0–10.5)

## 2017-06-03 LAB — I-STAT BETA HCG BLOOD, ED (MC, WL, AP ONLY): I-stat hCG, quantitative: >2000 m[IU]/mL — ABNORMAL HIGH (ref <5)

## 2017-06-03 LAB — URINALYSIS, ROUTINE W REFLEX MICROSCOPIC
Bilirubin Urine: NEGATIVE
Glucose, UA: NEGATIVE mg/dL
Hgb urine dipstick: NEGATIVE
KETONES UR: NEGATIVE mg/dL
LEUKOCYTES UA: NEGATIVE
NITRITE: NEGATIVE
PH: 6 (ref 5.0–8.0)
Protein, ur: NEGATIVE mg/dL
SPECIFIC GRAVITY, URINE: 1.024 (ref 1.005–1.030)

## 2017-06-03 LAB — WET PREP, GENITAL
Sperm: NONE SEEN
YEAST WET PREP: NONE SEEN

## 2017-06-03 LAB — LIPASE, BLOOD: LIPASE: 21 U/L (ref 11–51)

## 2017-06-03 LAB — HCG, QUANTITATIVE, PREGNANCY: HCG, BETA CHAIN, QUANT, S: 15140 m[IU]/mL — AB (ref <5)

## 2017-06-03 MED ORDER — ONDANSETRON HCL 4 MG PO TABS: 4.0000 mg | ORAL_TABLET | Freq: Four times a day (QID) | ORAL | 0 refills | Status: DC

## 2017-06-03 MED ORDER — ONDANSETRON 4 MG PO TBDP
4.0000 mg | ORAL_TABLET | Freq: Once | ORAL | Status: AC
  Administered 2017-06-03: 4 mg via ORAL
  Filled 2017-06-03: qty 1

## 2017-06-03 MED ORDER — SODIUM CHLORIDE 0.9 % IV BOLUS (SEPSIS)
1000.0000 mL | Freq: Once | INTRAVENOUS | Status: AC
  Administered 2017-06-03: 1000 mL via INTRAVENOUS

## 2017-06-03 MED ORDER — ONDANSETRON HCL 4 MG/2ML IJ SOLN
4.0000 mg | Freq: Once | INTRAMUSCULAR | Status: AC
  Administered 2017-06-03: 4 mg via INTRAVENOUS
  Filled 2017-06-03: qty 2

## 2017-06-03 MED ORDER — METRONIDAZOLE 500 MG PO TABS
2000.0000 mg | ORAL_TABLET | Freq: Once | ORAL | Status: AC
  Administered 2017-06-03: 2000 mg via ORAL
  Filled 2017-06-03: qty 4

## 2017-06-03 NOTE — ED Provider Notes (Signed)
[facility]-EMERGENCY DEPT Provider Note   CSN: [id number] Arrival date & time: 06/03/17  0857     History   Chief Complaint Chief Complaint  Patient presents with  . Emesis    HPI Krystal Murray is a 26 y.o. female who presents with 4 days of nausea/vomiting. Patient reports numerous episodes of vomiting since onset. She states that since yesterday she has noticed some yellow color to the emesis. No blood. She reports decreased PO secondary to symptoms. She states that ginger ale mildly alleviates symptoms but denies any other aggravating/alleviating factors. She has not taken any medications. Patient states that her last regular menstrual period was on 04/23/17. She had some very mild vaginal bleeding on 05/28/17 but states that it only lasted for one day before resolving. Patient states that she has been sexually active with one partner in the last month. Patient denies any fever, diarrhea, abdominal pain, chest pain, SOB, cough, dysuria, hematuria, vaginal discharge, vaginal bleeding. Patient is [id number]. Patient notes that she is followed by Dr. Coral Ceo at the [facility].  The history is provided by the patient.    Past Medical History:  Diagnosis Date  . Medical history non-contributory   . No pertinent past medical history   . UTI (lower urinary tract infection)     Patient Active Problem List   Diagnosis Date Noted  . Tubo-ovarian abscess 09/11/2014    Past Surgical History:  Procedure Laterality Date  . NO PAST SURGERIES      OB History    Gravida Para Term Preterm AB Living   4 2 2  0 1 2   SAB TAB Ectopic Multiple Live Births   0 1 0 0 2       Home Medications    Prior to Admission medications   Medication Sig Start Date End Date Taking? Authorizing Provider  ondansetron (ZOFRAN) 4 MG tablet Take 1 tablet (4 mg total) by mouth every 6 (six) hours. 06/03/17   Maxwell Caul, PA-C  oxyCODONE (OXY IR/ROXICODONE) 5 MG immediate release tablet Take 1  tablet (5 mg total) by mouth every 4 (four) hours as needed for moderate pain. Patient not taking: Reported on 01/17/2015 09/14/14   Joanna Puff, MD  traMADol (ULTRAM) 50 MG tablet Take 1 tablet (50 mg total) by mouth every 6 (six) hours as needed. Patient not taking: Reported on 04/29/2015 01/17/15   Derwood Kaplan, MD    Family History Family History  Problem Relation Age of Onset  . Cancer Maternal Aunt        uterine  . Diabetes Maternal Aunt   . Hypertension Maternal Aunt   . Cancer Maternal Grandmother        breastt  . Cancer Cousin        leaukemia    Social History Social History  Substance Use Topics  . Smoking status: Former Smoker    Quit date: 08/29/2011  . Smokeless tobacco: Never Used  . Alcohol use No     Allergies   Patient has no known allergies.   Review of Systems Review of Systems  Constitutional: Negative for chills and fever.  HENT: Negative for congestion, rhinorrhea and sore throat.   Respiratory: Negative for cough and shortness of breath.   Cardiovascular: Negative for chest pain.  Gastrointestinal: Positive for nausea and vomiting. Negative for abdominal pain and diarrhea.  Genitourinary: Negative for dysuria, hematuria and vaginal discharge.  Neurological: Negative for numbness and headaches.  All other systems  reviewed and are negative.    Physical Exam Updated Vital Signs BP 113/71 (BP Location: Left Arm)   Pulse 71   Temp 98.3 F (36.8 C) (Oral)   Resp 14   SpO2 100%   Physical Exam  Constitutional: She is oriented to person, place, and time. She appears well-developed and well-nourished.  Sitting comfortably on examination table. NAD  HENT:  Head: Normocephalic and atraumatic.  Mouth/Throat: Oropharynx is clear and moist. Mucous membranes are dry.  Eyes: Conjunctivae, EOM and lids are normal. Pupils are equal, round, and reactive to light.  Neck: Full passive range of motion without pain.  Cardiovascular: Normal rate,  regular rhythm, normal heart sounds and normal pulses.  Exam reveals no gallop and no friction rub.   No murmur heard. Pulmonary/Chest: Effort normal and breath sounds normal.  Abdominal: Soft. Normal appearance. There is no tenderness. There is no rigidity and no guarding.  Musculoskeletal: Normal range of motion.  Neurological: She is alert and oriented to person, place, and time.  Skin: Skin is warm and dry. Capillary refill takes less than 2 seconds.  Psychiatric: She has a normal mood and affect. Her speech is normal.  Nursing note and vitals reviewed.    ED Treatments / Results  Labs (all labs ordered are listed, but only abnormal results are displayed) Labs Reviewed  WET PREP, GENITAL - Abnormal; Notable for the following:       Result Value   Trich, Wet Prep PRESENT (*)    Clue Cells Wet Prep HPF POC PRESENT (*)    WBC, Wet Prep HPF POC MODERATE (*)    All other components within normal limits  COMPREHENSIVE METABOLIC PANEL - Abnormal; Notable for the following:    CO2 21 (*)    Glucose, Bld 108 (*)    ALT 8 (*)    Alkaline Phosphatase 32 (*)    All other components within normal limits  CBC - Abnormal; Notable for the following:    Hemoglobin 11.0 (*)    HCT 34.8 (*)    All other components within normal limits  URINALYSIS, ROUTINE W REFLEX MICROSCOPIC - Abnormal; Notable for the following:    APPearance HAZY (*)    All other components within normal limits  HCG, QUANTITATIVE, PREGNANCY - Abnormal; Notable for the following:    hCG, Beta Chain, Quant, S 15,140 (*)    All other components within normal limits  I-STAT BETA HCG BLOOD, ED ([facility], [facility], [facility] ONLY) - Abnormal; Notable for the following:    I-stat hCG, quantitative >2,000.0 (*)    All other components within normal limits  LIPASE, BLOOD  GC/CHLAMYDIA PROBE AMP ([place]) NOT AT [facility]    EKG  EKG Interpretation None       Radiology US Ob Comp Less 14 Wks  Result Date: 06/03/2017 CLINICAL DATA:   Vomiting for 4 days. EXAM: OBSTETRIC <14 WK [place] AND TRANSVAGINAL OB US TECHNIQUE: Both transabdominal and transvaginal ultrasound examinations were performed for complete evaluation of the gestation as well as the maternal uterus, adnexal regions, and pelvic cul-de-sac. Transvaginal technique was performed to assess early pregnancy. COMPARISON:  Pelvic ultrasound 09/11/2014 FINDINGS: Intrauterine gestational sac: Single Yolk sac:  Present Embryo:  Not visualized Cardiac Activity: Not visualized MSD: 16.4  mm   6 w   3  d Subchorionic hemorrhage:  None visualized. Maternal uterus/adnexae: Unremarkable appearance of the right ovary. Small left ovarian cysts measure up to 2.4 cm in size. Trace pelvic free fluid. IMPRESSION:  Intrauterine pregnancy with yolk sac but no embryo visualized. Findings are suspicious but not yet definitive for failed pregnancy. Recommend follow-up US in 10-14 days for definitive diagnosis. This recommendation follows SRU consensus guidelines: Diagnostic Criteria for Nonviable Pregnancy Early in the First Trimester. Malva Limes Engl J Med 2013; 956:2130-86; 369:1443-51. Electronically Signed   By: Sebastian AcheAllen  Grady M.D.   On: 06/03/2017 11:28   [place] Ob Transvaginal  Result Date: 06/03/2017 CLINICAL DATA:  Vomiting for 4 days. EXAM: OBSTETRIC <14 WK US AND TRANSVAGINAL OB US TECHNIQUE: Both transabdominal and transvaginal ultrasound examinations were performed for complete evaluation of the gestation as well as the maternal uterus, adnexal regions, and pelvic cul-de-sac. Transvaginal technique was performed to assess early pregnancy. COMPARISON:  Pelvic ultrasound 09/11/2014 FINDINGS: Intrauterine gestational sac: Single Yolk sac:  Present Embryo:  Not visualized Cardiac Activity: Not visualized MSD: 16.4  mm   6 w   3  d Subchorionic hemorrhage:  None visualized. Maternal uterus/adnexae: Unremarkable appearance of the right ovary. Small left ovarian cysts measure up to 2.4 cm in size. Trace pelvic free fluid. IMPRESSION:  Intrauterine pregnancy with yolk sac but no embryo visualized. Findings are suspicious but not yet definitive for failed pregnancy. Recommend follow-up US in 10-14 days for definitive diagnosis. This recommendation follows SRU consensus guidelines: Diagnostic Criteria for Nonviable Pregnancy Early in the First Trimester. Malva Limes Engl J Med 2013; 578:4696-29; 369:1443-51. Electronically Signed   By: Sebastian AcheAllen  Grady M.D.   On: 06/03/2017 11:28    Procedures Procedures (including critical care time)  Medications Ordered in ED Medications  ondansetron (ZOFRAN) injection 4 mg (not administered)  ondansetron (ZOFRAN-ODT) disintegrating tablet 4 mg (4 mg Oral Given 06/03/17 0927)  sodium chloride 0.9 % bolus 1,000 mL (0 mLs Intravenous Stopped 06/03/17 1212)  metroNIDAZOLE (FLAGYL) tablet 2,000 mg (2,000 mg Oral Given 06/03/17 1249)     Initial Impression / Assessment and Plan / ED Course  I have reviewed the triage vital signs and the nursing notes.  Pertinent labs & imaging results that were available during my care of the patient were reviewed by me and considered in my medical decision making (see chart for details).     26 year-old female presents with 4 days of nausea/vomiting. No other GI symptoms. Patient is afebrile, non-toxic appearing, sitting comfortably on examination table. Vital signs reviewed. Patient is slightly hypotensive on initial ED arrival. Will plan to give IVF for fluid resuscitation. Consider pregnancy versus acute viral GI process. Labs ordered at triage. I-STAT beta is positive. Will repeat hCG quantitative. UA negative for any acute signs of UTI. CMP mild elevation in glucose but otherwise within normal limits. Lipase normal. CBC shows slight drop in H&H but records reviewed show that this is consistent with prior. Based on patient's last LMP she is 5w 6d. Based on initial I-stat beta quant, this level is appropriate. Will plan to do pelvic exam for further evaluation. Will obtain U/S for further  evaluation.   Repeat quant shows 15,140. Ultrasound shows that there is an intrauterine pregnancy with a yolk sac but no evidence of embryo. This could be suspicious of a failed pregnancy. Recommends repeat ultrasound next 10-14 days. Discussed results with patient. She is aware of the potential failed pregnancy. Explained to her that we will have her repeat the ultrasound with her OB/GYN.  Consult to Coral Ceoharles Harper, OB/GYN   Discussed with Dr. Clearance CootsHarper. Will plan to see patient in the next 4 weeks. Recommends patient [facility] if she  has any further symptoms or concerns.  Wet prep returned with trichomonas. Will plan to treat in the department based on recommended CDC dose.  Discussed results with patient. Instructed her to follow up with her OB/GYN next 3-4 weeks for further evaluation. Instructed to call Monday to arrange for an appointment. Strict return precautions discussed. Patient expresses understanding and agreement to plan.   Final Clinical Impressions(s) / ED Diagnoses   Final diagnoses:  Non-intractable vomiting with nausea, unspecified vomiting type  Less than [redacted] weeks gestation of pregnancy  Trichomonas infection    New Prescriptions New Prescriptions   ONDANSETRON (ZOFRAN) 4 MG TABLET    Take 1 tablet (4 mg total) by mouth every 6 (six) hours.     Maxwell Caul, PA-C 06/03/17 1640    Rolland Porter, MD 06/07/17 [phone]

## 2017-06-03 NOTE — ED Triage Notes (Signed)
Patient complains of nausea and vomiing x 4 days. Denies abdominal pain and reports abnormal light past period, NAD

## 2017-06-03 NOTE — Discharge Instructions (Signed)
Follow-up with Dr. Verdell CarmineHarper's office in the next 3-4 weeks for further imaging and evaluation. Call on Monday to arrange for an appointment.   Take medication as needed for nausea/vomiting.   If you have any other symptoms, [facility] evaluation.  [facility] the [facility] for any worsening nausea/vomiting, fever, abdominal pain, vaginal bleeding, pain with urination or any other worsening or concerning symptoms.

## 2017-06-04 LAB — GC/CHLAMYDIA PROBE AMP (~~LOC~~) NOT AT ARMC
CHLAMYDIA, DNA PROBE: NEGATIVE
Neisseria Gonorrhea: NEGATIVE

## 2017-06-07 ENCOUNTER — Encounter (HOSPITAL_COMMUNITY): Payer: Self-pay | Admitting: Emergency Medicine

## 2017-06-07 ENCOUNTER — Emergency Department (HOSPITAL_COMMUNITY)
Admission: EM | Admit: 2017-06-07 | Discharge: 2017-06-08 | Disposition: A | Discharge status: Home / Self Care | Payer: Medicaid Other | Attending: Emergency Medicine | Admitting: Emergency Medicine

## 2017-06-07 DIAGNOSIS — O468X1 Other antepartum hemorrhage, first trimester: Secondary | ICD-10-CM

## 2017-06-07 DIAGNOSIS — Z87891 Personal history of nicotine dependence: Secondary | ICD-10-CM | POA: Insufficient documentation

## 2017-06-07 DIAGNOSIS — E860 Dehydration: Secondary | ICD-10-CM

## 2017-06-07 DIAGNOSIS — Z3A01 Less than 8 weeks gestation of pregnancy: Secondary | ICD-10-CM | POA: Diagnosis not present

## 2017-06-07 DIAGNOSIS — R109 Unspecified abdominal pain: Secondary | ICD-10-CM

## 2017-06-07 DIAGNOSIS — O418X10 Other specified disorders of amniotic fluid and membranes, first trimester, not applicable or unspecified: Secondary | ICD-10-CM

## 2017-06-07 DIAGNOSIS — O208 Other hemorrhage in early pregnancy: Secondary | ICD-10-CM | POA: Diagnosis not present

## 2017-06-07 DIAGNOSIS — O219 Vomiting of pregnancy, unspecified: Secondary | ICD-10-CM | POA: Insufficient documentation

## 2017-06-07 DIAGNOSIS — O26899 Other specified pregnancy related conditions, unspecified trimester: Secondary | ICD-10-CM

## 2017-06-07 DIAGNOSIS — Z3491 Encounter for supervision of normal pregnancy, unspecified, first trimester: Secondary | ICD-10-CM

## 2017-06-07 LAB — CBC
HCT: 36.6 % (ref 36.0–46.0)
Hemoglobin: 11.7 g/dL — ABNORMAL LOW (ref 12.0–15.0)
MCH: 27.1 pg (ref 26.0–34.0)
MCHC: 32 g/dL (ref 30.0–36.0)
MCV: 84.9 fL (ref 78.0–100.0)
PLATELETS: 208 10*3/uL (ref 150–400)
RBC: 4.31 MIL/uL (ref 3.87–5.11)
RDW: 14 % (ref 11.5–15.5)
WBC: 7.9 10*3/uL (ref 4.0–10.5)

## 2017-06-07 LAB — COMPREHENSIVE METABOLIC PANEL
ALK PHOS: 28 U/L — AB (ref 38–126)
ALT: 9 U/L — AB (ref 14–54)
AST: 16 U/L (ref 15–41)
Albumin: 4.1 g/dL (ref 3.5–5.0)
Anion gap: 9 (ref 5–15)
BUN: 8 mg/dL (ref 6–20)
CALCIUM: 9.4 mg/dL (ref 8.9–10.3)
CO2: 23 mmol/L (ref 22–32)
Chloride: 103 mmol/L (ref 101–111)
Creatinine, Ser: 0.67 mg/dL (ref 0.44–1.00)
GFR calc Af Amer: >60 mL/min (ref >60)
GFR calc non Af Amer: >60 mL/min (ref >60)
GLUCOSE: 96 mg/dL (ref 65–99)
Potassium: 3.5 mmol/L (ref 3.5–5.1)
SODIUM: 135 mmol/L (ref 135–145)
Total Bilirubin: 0.5 mg/dL (ref 0.3–1.2)
Total Protein: 7.3 g/dL (ref 6.5–8.1)

## 2017-06-07 LAB — URINALYSIS, ROUTINE W REFLEX MICROSCOPIC
Bilirubin Urine: NEGATIVE
GLUCOSE, UA: NEGATIVE mg/dL
Hgb urine dipstick: NEGATIVE
Ketones, ur: 5 mg/dL — AB
Nitrite: NEGATIVE
PH: 6 (ref 5.0–8.0)
PROTEIN: NEGATIVE mg/dL
Specific Gravity, Urine: 1.023 (ref 1.005–1.030)

## 2017-06-07 LAB — LIPASE, BLOOD: Lipase: 20 U/L (ref 11–51)

## 2017-06-07 LAB — HCG, QUANTITATIVE, PREGNANCY: hCG, Beta Chain, Quant, S: 32593 m[IU]/mL — ABNORMAL HIGH (ref <5)

## 2017-06-07 MED ORDER — ONDANSETRON 4 MG PO TBDP
4.0000 mg | ORAL_TABLET | Freq: Once | ORAL | Status: AC | PRN
  Administered 2017-06-08: 4 mg via ORAL
  Filled 2017-06-07: qty 1

## 2017-06-07 NOTE — ED Triage Notes (Signed)
Pt presents stating she was here last week for same S/S, fatigue, abd pain, N/V. Was told that she was pregnant. Was unable to get her prescription for zofran filled.

## 2017-06-07 NOTE — ED Provider Notes (Signed)
[facility]-EMERGENCY DEPT Provider Note   CSN: [id number] Arrival date & time: 06/07/17  2127  By signing my name below, I, Cynda Acres, attest that this documentation has been prepared under the direction and in the presence of Javon Snee, Mayer Masker, MD. Electronically Signed: Cynda Acres, Scribe. 06/07/17. 11:59 PM.  History   Chief Complaint Chief Complaint  Patient presents with  . Fatigue   HPI Comments: Krystal Murray is a 26 y.o. female with no pertinent past medical history, who presents to the Emergency Department complaining of sudden-onset, persistent vomiting that began several days ago. Patient states she is unable to keep anything down. Patient denies hematemesis. Patient was noted to have been seen here one week ago for the same symptoms, she was prescribed Zofran which she was unable to afford. Patient is currently pregnant. [id number]. Patient reports associated nausea, fatigue, chills, and abdominal pain. No modifying factors indicated. Patient denies any fever, vaginal discharge, vaginal bleeding, hematuria, or dysuria.   Of note, patient had a beta hCG of approximately 15,000 5 days ago. Beta hCG today 32,000.  Ultrasound concerning previously for nonviable pregnancy.  The history is provided by the patient. No language interpreter was used.    Past Medical History:  Diagnosis Date  . Medical history non-contributory   . No pertinent past medical history   . UTI (lower urinary tract infection)     Patient Active Problem List   Diagnosis Date Noted  . Tubo-ovarian abscess 09/11/2014    Past Surgical History:  Procedure Laterality Date  . NO PAST SURGERIES      OB History    Gravida Para Term Preterm AB Living   5 2 2  0 1 2   SAB TAB Ectopic Multiple Live Births   0 1 0 0 2       Home Medications    Prior to Admission medications   Medication Sig Start Date End Date Taking? Authorizing Provider  metoCLOPramide (REGLAN) 10 MG tablet Take 1 tablet (10 mg  total) by mouth every 6 (six) hours as needed for nausea or vomiting. 06/08/17   Latavious Bitter, Mayer Masker, MD  ondansetron (ZOFRAN) 4 MG tablet Take 1 tablet (4 mg total) by mouth every 6 (six) hours. 06/03/17   Maxwell Caul, PA-C  oxyCODONE (OXY IR/ROXICODONE) 5 MG immediate release tablet Take 1 tablet (5 mg total) by mouth every 4 (four) hours as needed for moderate pain. Patient not taking: Reported on 01/17/2015 09/14/14   Joanna Puff, MD  traMADol (ULTRAM) 50 MG tablet Take 1 tablet (50 mg total) by mouth every 6 (six) hours as needed. Patient not taking: Reported on 04/29/2015 01/17/15   Derwood Kaplan, MD    Family History Family History  Problem Relation Age of Onset  . Cancer Maternal Aunt        uterine  . Diabetes Maternal Aunt   . Hypertension Maternal Aunt   . Cancer Maternal Grandmother        breastt  . Cancer Cousin        leaukemia    Social History Social History  Substance Use Topics  . Smoking status: Former Smoker    Quit date: 08/29/2011  . Smokeless tobacco: Never Used  . Alcohol use No     Allergies   Patient has no known allergies.   Review of Systems Review of Systems  Constitutional: Positive for chills and fatigue. Negative for fever.  Respiratory: Negative for shortness of breath.   Cardiovascular: Negative for  chest pain.  Gastrointestinal: Positive for abdominal pain, nausea and vomiting.  Genitourinary: Negative for dysuria, hematuria, vaginal bleeding and vaginal discharge.  Psychiatric/Behavioral: Positive for self-injury.  All other systems reviewed and are negative.    Physical Exam Updated Vital Signs BP 116/81   Pulse 79   Temp 98.6 F (37 C) (Oral)   Resp 16   Ht 5\' 6"  (1.676 m)   Wt 81.6 kg (180 lb)   LMP 06/25/2016   SpO2 100%   BMI 29.05 kg/m   Physical Exam  Constitutional: She is oriented to person, place, and time. She appears well-developed and well-nourished. No distress.  HENT:  Head: Normocephalic and  atraumatic.  Cardiovascular: Normal rate, regular rhythm and normal heart sounds.   No murmur heard. Pulmonary/Chest: Effort normal and breath sounds normal. No respiratory distress. She has no wheezes.  Abdominal: Soft. Bowel sounds are normal. She exhibits no mass. There is tenderness. There is no guarding.  Suprapubic tenderness to palpation without rebound or guarding  Neurological: She is alert and oriented to person, place, and time.  Skin: Skin is warm and dry.  Psychiatric: She has a normal mood and affect.  Nursing note and vitals reviewed.    ED Treatments / Results  DIAGNOSTIC STUDIES: Oxygen Saturation is 100% on RA, normal by my interpretation.    COORDINATION OF CARE: 11:58 PM Discussed treatment plan with Krystal Murray at bedside and Krystal Murray agreed to plan, which includes   Labs (all labs ordered are listed, but only abnormal results are displayed) Labs Reviewed  COMPREHENSIVE METABOLIC PANEL - Abnormal; Notable for the following:       Result Value   ALT 9 (*)    Alkaline Phosphatase 28 (*)    All other components within normal limits  CBC - Abnormal; Notable for the following:    Hemoglobin 11.7 (*)    All other components within normal limits  URINALYSIS, ROUTINE W REFLEX MICROSCOPIC - Abnormal; Notable for the following:    APPearance HAZY (*)    Ketones, ur 5 (*)    Leukocytes, UA TRACE (*)    Bacteria, UA RARE (*)    Squamous Epithelial / LPF 6-30 (*)    All other components within normal limits  HCG, QUANTITATIVE, PREGNANCY - Abnormal; Notable for the following:    hCG, Beta Chain, Quant, S 32,593 (*)    All other components within normal limits  LIPASE, BLOOD    EKG  EKG Interpretation None       Radiology [place] Ob Comp Less 14 Wks  Result Date: 06/08/2017 CLINICAL DATA:  Acute onset of lower abdominal pain, nausea and vomiting. Initial encounter. EXAM: OBSTETRIC <14 WK US AND TRANSVAGINAL OB US TECHNIQUE: Both transabdominal and transvaginal ultrasound  examinations were performed for complete evaluation of the gestation as well as the maternal uterus, adnexal regions, and pelvic cul-de-sac. Transvaginal technique was performed to assess early pregnancy. COMPARISON:  Pelvic ultrasound performed 06/03/2017 FINDINGS: Intrauterine gestational sac: Single; visualized and normal in shape. Yolk sac:  Yes Embryo:  Yes Cardiac Activity: Yes Heart Rate: 121  bpm CRL:  4.1 mm   6 w   1 d                  US EDC: 01/31/2018 Subchorionic hemorrhage: A large amount of subchorionic hemorrhage is noted. Maternal uterus/adnexae: The ovaries are unremarkable in appearance. The right ovary measures 2.9 x 2.0 x 1.8 cm, while the left ovary measures 3.9 x 2.4 x 2.4  cm. No suspicious adnexal masses are seen; there is no evidence for ovarian torsion. Trace free fluid is seen within the pelvic cul-de-sac. IMPRESSION: 1. Single live intrauterine pregnancy noted, with a crown-rump length of 4 mm, corresponding to a gestational age of [redacted] weeks 1 day. This matches the gestational age of [redacted] weeks 0 days by LMP, reflecting an estimated date of delivery of February 01, 2018. 2. Large amount of subchorionic hemorrhage noted. Electronically Signed   By: Roanna Raider M.D.   On: 06/08/2017 01:54   US Ob Transvaginal  Result Date: 06/08/2017 CLINICAL DATA:  Acute onset of lower abdominal pain, nausea and vomiting. Initial encounter. EXAM: OBSTETRIC <14 WK [place] AND TRANSVAGINAL OB US TECHNIQUE: Both transabdominal and transvaginal ultrasound examinations were performed for complete evaluation of the gestation as well as the maternal uterus, adnexal regions, and pelvic cul-de-sac. Transvaginal technique was performed to assess early pregnancy. COMPARISON:  Pelvic ultrasound performed 06/03/2017 FINDINGS: Intrauterine gestational sac: Single; visualized and normal in shape. Yolk sac:  Yes Embryo:  Yes Cardiac Activity: Yes Heart Rate: 121  bpm CRL:  4.1 mm   6 w   1 d                  [place] EDC:  01/31/2018 Subchorionic hemorrhage: A large amount of subchorionic hemorrhage is noted. Maternal uterus/adnexae: The ovaries are unremarkable in appearance. The right ovary measures 2.9 x 2.0 x 1.8 cm, while the left ovary measures 3.9 x 2.4 x 2.4 cm. No suspicious adnexal masses are seen; there is no evidence for ovarian torsion. Trace free fluid is seen within the pelvic cul-de-sac. IMPRESSION: 1. Single live intrauterine pregnancy noted, with a crown-rump length of 4 mm, corresponding to a gestational age of [redacted] weeks 1 day. This matches the gestational age of [redacted] weeks 0 days by LMP, reflecting an estimated date of delivery of February 01, 2018. 2. Large amount of subchorionic hemorrhage noted. Electronically Signed   By: Roanna Raider M.D.   On: 06/08/2017 01:54    Procedures Procedures (including critical care time)  Medications Ordered in ED Medications  ondansetron (ZOFRAN-ODT) disintegrating tablet 4 mg (4 mg Oral Given 06/08/17 0040)  sodium chloride 0.9 % bolus 1,000 mL (1,000 mLs Intravenous New Bag/Given 06/08/17 0041)  metoCLOPramide (REGLAN) injection 10 mg (10 mg Intravenous Given 06/08/17 0040)     Initial Impression / Assessment and Plan / ED Course  I have reviewed the triage vital signs and the nursing notes.  Pertinent labs & imaging results that were available during my care of the patient were reviewed by me and considered in my medical decision making (see chart for details).     Patient presents with abdominal pain, nausea, vomiting during early pregnancy. Last ultrasound with indeterminate viability. She is nontoxic on exam. She does not appear significantly dehydrated. She was given fluids and Reglan. Beta has more than doubled. Repeat ultrasound obtained given tenderness on exam. Ultrasound now showing a fetal heartbeat. She does have a significant subchorionic hemorrhage. No bleeding. Discussed the results with patient. She is able to tolerate fluids. Will discharge home  with Reglan and close OB/GYN follow-up.  After history, exam, and medical workup I feel the patient has been appropriately medically screened and is safe for discharge home. Pertinent diagnoses were discussed with the patient. Patient was given return precautions.   Final Clinical Impressions(s) / ED Diagnoses   Final diagnoses:  Abdominal pain during pregnancy  First trimester pregnancy  Nausea and  vomiting during pregnancy  Dehydration  Subchorionic hematoma in first trimester, single or unspecified fetus    New Prescriptions New Prescriptions   METOCLOPRAMIDE (REGLAN) 10 MG TABLET    Take 1 tablet (10 mg total) by mouth every 6 (six) hours as needed for nausea or vomiting.   I personally performed the services described in this documentation, which was scribed in my presence. The recorded information has been reviewed and is accurate.     Shon Baton, MD 06/08/17 [phone]

## 2017-06-08 ENCOUNTER — Emergency Department (HOSPITAL_COMMUNITY): Payer: Medicaid Other

## 2017-06-08 MED ORDER — METOCLOPRAMIDE HCL 5 MG/ML IJ SOLN
10.0000 mg | Freq: Once | INTRAMUSCULAR | Status: AC
  Administered 2017-06-08: 10 mg via INTRAVENOUS
  Filled 2017-06-08: qty 2

## 2017-06-08 MED ORDER — METOCLOPRAMIDE HCL 10 MG PO TABS: 10.0000 mg | ORAL_TABLET | Freq: Four times a day (QID) | ORAL | 0 refills | Status: DC | PRN

## 2017-06-08 MED ORDER — SODIUM CHLORIDE 0.9 % IV BOLUS (SEPSIS)
1000.0000 mL | Freq: Once | INTRAVENOUS | Status: AC
  Administered 2017-06-08: 1000 mL via INTRAVENOUS

## 2017-06-08 NOTE — Discharge Instructions (Signed)
You were seen today for nausea, vomiting, and dehydration during early pregnancy. Your ultrasound shows a single live intrauterine pregnancy at 6 weeks and 1 day. Incidentally, you have a subchorionic hematoma which is just some blood that has collected behind the placenta. You need to follow-up closely with OB/GYN. If you develop worsening abdominal pain, vaginal bleeding or any new or worsening symptoms you should be reevaluated.

## 2017-06-29 ENCOUNTER — Inpatient Hospital Stay (HOSPITAL_COMMUNITY)
Admission: AD | Admit: 2017-06-29 | Discharge: 2017-06-29 | Disposition: A | Discharge status: Home / Self Care | Payer: Medicaid Other | Source: Ambulatory Visit | Attending: Family Medicine | Admitting: Family Medicine

## 2017-06-29 ENCOUNTER — Encounter (HOSPITAL_COMMUNITY): Payer: Self-pay | Admitting: *Deleted

## 2017-06-29 DIAGNOSIS — Z3A09 9 weeks gestation of pregnancy: Secondary | ICD-10-CM | POA: Insufficient documentation

## 2017-06-29 DIAGNOSIS — Z87891 Personal history of nicotine dependence: Secondary | ICD-10-CM | POA: Diagnosis not present

## 2017-06-29 DIAGNOSIS — O219 Vomiting of pregnancy, unspecified: Secondary | ICD-10-CM

## 2017-06-29 DIAGNOSIS — R1110 Vomiting, unspecified: Secondary | ICD-10-CM | POA: Diagnosis present

## 2017-06-29 LAB — URINALYSIS, ROUTINE W REFLEX MICROSCOPIC
BILIRUBIN URINE: NEGATIVE
Glucose, UA: NEGATIVE mg/dL
HGB URINE DIPSTICK: NEGATIVE
KETONES UR: 20 mg/dL — AB
LEUKOCYTES UA: NEGATIVE
NITRITE: NEGATIVE
PH: 5 (ref 5.0–8.0)
Protein, ur: 100 mg/dL — AB
Specific Gravity, Urine: 1.024 (ref 1.005–1.030)

## 2017-06-29 MED ORDER — PROMETHAZINE HCL 25 MG/ML IJ SOLN
25.0000 mg | Freq: Once | INTRAMUSCULAR | Status: AC
  Administered 2017-06-29: 25 mg via INTRAVENOUS
  Filled 2017-06-29: qty 1

## 2017-06-29 MED ORDER — LACTATED RINGERS IV BOLUS (SEPSIS)
1000.0000 mL | Freq: Once | INTRAVENOUS | Status: AC
  Administered 2017-06-29: 1000 mL via INTRAVENOUS

## 2017-06-29 MED ORDER — PROMETHAZINE HCL 12.5 MG PO TABS: 12.5000 mg | ORAL_TABLET | Freq: Four times a day (QID) | ORAL | 0 refills | Status: DC | PRN

## 2017-06-29 NOTE — Discharge Instructions (Signed)
Morning Sickness °Morning sickness is when you feel sick to your stomach (nauseous) during pregnancy. You may feel sick to your stomach and throw up (vomit). You may feel sick in the morning, but you can feel this way any time of day. Some women feel very sick to their stomach and cannot stop throwing up (hyperemesis gravidarum). °Follow these instructions at home: °· Only take medicines as told by your doctor. °· Take multivitamins as told by your doctor. Taking multivitamins before getting pregnant can stop or lessen the harshness of morning sickness. °· Eat dry toast or unsalted crackers before getting out of bed. °· Eat 5 to 6 small meals a day. °· Eat dry and bland foods like rice and baked potatoes. °· Do not drink liquids with meals. Drink between meals. °· Do not eat greasy, fatty, or spicy foods. °· Have someone cook for you if the smell of food causes you to feel sick or throw up. °· If you feel sick to your stomach after taking prenatal vitamins, take them at night or with a snack. °· Eat protein when you need a snack (nuts, yogurt, cheese). °· Eat unsweetened gelatins for dessert. °· Wear a bracelet used for sea sickness (acupressure wristband). °· Go to a doctor that puts thin needles into certain body points (acupuncture) to improve how you feel. °· Do not smoke. °· Use a humidifier to keep the air in your house free of odors. °· Get lots of fresh air. °Contact a doctor if: °· You need medicine to feel better. °· You feel dizzy or lightheaded. °· You are losing weight. °Get help right away if: °· You feel very sick to your stomach and cannot stop throwing up. °· You pass out (faint). °This information is not intended to replace advice given to you by your health care provider. Make sure you discuss any questions you have with your health care provider. °Document Released: 01/14/2005 Document Revised: 05/14/2016 Document Reviewed: 05/24/2013 °Elsevier Interactive Patient Education © 2017 [employer] ° ° °SAFE MEDICATIONS IN PREGNANCY ° °Acne:  °Benzoyl Peroxide  °Salicylic Acid  ° °Backache/Headache:  °Tylenol: 2 regular strength every 4 hours OR  °             2 Extra strength every 6 hours  ° °Colds/Coughs/Allergies:  °Benadryl (alcohol free) 25 mg every 6 hours as needed  °Breath right strips  °Claritin  °Cepacol throat lozenges  °Chloraseptic throat spray  °Cold-Eeze- up to three times per day  °Cough drops, alcohol free  °Flonase (by prescription only)  °Guaifenesin  °Mucinex  °Robitussin DM (plain only, alcohol free)  °Saline nasal spray/drops  °Sudafed (pseudoephedrine) & Actifed * use only after [redacted] weeks gestation and if you do not have high blood pressure  °Tylenol  °Vicks Vaporub  °Zinc lozenges  °Zyrtec  ° °Constipation:  °Colace  °Ducolax suppositories  °Fleet enema  °Glycerin suppositories  °Metamucil  °Milk of magnesia  °Miralax  °Senokot  °Smooth move tea  ° °Diarrhea:  °Kaopectate  °Imodium A-D  ° °*NO pepto Bismol  ° °Hemorrhoids:  °Anusol  °Anusol HC  °Preparation H  °Tucks  ° °Indigestion:  °Tums  °Maalox  °Mylanta  °Zantac  °Pepcid  ° °Insomnia:  °Benadryl (alcohol free) 25mg every 6 hours as needed  °Tylenol PM  °Unisom, no Gelcaps  ° °Leg Cramps:  °Tums  °MagGel  ° °Nausea/Vomiting:  °Bonine  °Dramamine  °Emetrol  °Ginger extract  °Sea bands  °Meclizine  °Nausea medication to take during pregnancy:  °Unisom (doxylamine succinate   25 mg tablets) Take one tablet daily at bedtime. If symptoms are not adequately controlled, the dose can be increased to a maximum recommended dose of two tablets daily (1/2 tablet in the morning, 1/2 tablet mid-afternoon and one at bedtime).  °Vitamin B6 100mg tablets. Take one tablet twice a day (up to 200 mg per day).  ° °Skin Rashes:  °Aveeno products  °Benadryl cream or 25mg every 6 hours as needed  °Calamine Lotion  °1% cortisone cream  ° °Yeast infection:  °Gyne-lotrimin 7  °Monistat 7  ° ° °**If taking multiple medications, please check labels to avoid  duplicating the same active ingredients  °**take medication as directed on the label  °** Do not exceed 4000 mg of tylenol in 24 hours  °**Do not take medications that contain aspirin or ibuprofen  ° °

## 2017-06-29 NOTE — MAU Note (Addendum)
+  nausea/vomiting Past 24 hours states vomited over 6 times States unable to keep anything to eat or drink down  Patient observed spitting in emesis bag during triage  +lower abdominal pain  States has a lot of mucous in her States feels dehydrated Went to [facility] on 6/19

## 2017-06-29 NOTE — MAU Provider Note (Signed)
MAU HISTORY AND PHYSICAL  Chief Complaint:  vomiting  Krystal Murray is a 26 y.o.  [id number]  at [redacted]w[redacted]d presenting for the above.  Symptoms for about a month. Vomiting 5-6 times a day. Trouble holding down fluids. Thinks may be urinating less. Occasional loose stools. Has lost weight. No vaginal bleeding. No significant abdominal pain. Occasional loose stools. Not taking any medicine for vomiting. Went to ed for similar symptoms last month, iup diagnosed w/ u/s. Says zofran didn't help at all. Prescribed reglan but didn't pick up script, so hasn't been taking any meds. No bleeding or lof or lower abdominal pain.   Plans to establish at [facility]  Past Medical History:  Diagnosis Date  . Medical history non-contributory   . No pertinent past medical history   . UTI (lower urinary tract infection)     Past Surgical History:  Procedure Laterality Date  . NO PAST SURGERIES      Family History  Problem Relation Age of Onset  . Cancer Maternal Aunt        uterine  . Diabetes Maternal Aunt   . Hypertension Maternal Aunt   . Cancer Maternal Grandmother        breastt  . Cancer Cousin        leaukemia    Social History  Substance Use Topics  . Smoking status: Former Smoker    Quit date: 08/29/2011  . Smokeless tobacco: Never Used  . Alcohol use No    No Known Allergies  Prescriptions Prior to Admission  Medication Sig Dispense Refill Last Dose  . metoCLOPramide (REGLAN) 10 MG tablet Take 1 tablet (10 mg total) by mouth every 6 (six) hours as needed for nausea or vomiting. 30 tablet 0   . ondansetron (ZOFRAN) 4 MG tablet Take 1 tablet (4 mg total) by mouth every 6 (six) hours. 30 tablet 0   . oxyCODONE (OXY IR/ROXICODONE) 5 MG immediate release tablet Take 1 tablet (5 mg total) by mouth every 4 (four) hours as needed for moderate pain. (Patient not taking: Reported on 01/17/2015) 30 tablet 0 Completed Course at Unknown time  . traMADol (ULTRAM) 50 MG tablet Take 1 tablet (50 mg total)  by mouth every 6 (six) hours as needed. (Patient not taking: Reported on 04/29/2015) 15 tablet 0 Completed Course at Unknown time    Review of Systems - Negative except for what is mentioned in HPI.  Physical Exam  Blood pressure 112/67, pulse 94, temperature 98.6 F (37 C), temperature source Oral, resp. rate 16, SpO2 100 %, unknown if currently breastfeeding. GENERAL: Well-developed, well-nourished female in no acute distress.  LUNGS: Clear to auscultation bilaterally.  HEART: Regular rate and rhythm. ABDOMEN: Soft, nontender, nondistended. No rebound or guarding. EXTREMITIES: Nontender, no edema, 2+ distal pulses. GU: deferred  Labs: Results for orders placed or performed during the hospital encounter of 06/29/17 (from the past 24 hour(s))  Urinalysis, Routine w reflex microscopic   Collection Time: 06/29/17  6:20 PM  Result Value Ref Range   Color, Urine YELLOW YELLOW   APPearance HAZY (A) CLEAR   Specific Gravity, Urine 1.024 1.005 - 1.030   pH 5.0 5.0 - 8.0   Glucose, UA NEGATIVE NEGATIVE mg/dL   Hgb urine dipstick NEGATIVE NEGATIVE   Bilirubin Urine NEGATIVE NEGATIVE   Ketones, ur 20 (A) NEGATIVE mg/dL   Protein, ur 454 (A) NEGATIVE mg/dL   Nitrite NEGATIVE NEGATIVE   Leukocytes, UA NEGATIVE NEGATIVE   RBC / HPF 0-5 0 -  5 RBC/hpf   WBC, UA 0-5 0 - 5 WBC/hpf   Bacteria, UA FEW (A) NONE SEEN   Squamous Epithelial / LPF 6-30 (A) NONE SEEN   Mucous PRESENT     Imaging Studies:  US Ob Comp Less 14 Wks  Result Date: 06/08/2017 CLINICAL DATA:  Acute onset of lower abdominal pain, nausea and vomiting. Initial encounter. EXAM: OBSTETRIC <14 WK [place] AND TRANSVAGINAL OB US TECHNIQUE: Both transabdominal and transvaginal ultrasound examinations were performed for complete evaluation of the gestation as well as the maternal uterus, adnexal regions, and pelvic cul-de-sac. Transvaginal technique was performed to assess early pregnancy. COMPARISON:  Pelvic ultrasound performed 06/03/2017  FINDINGS: Intrauterine gestational sac: Single; visualized and normal in shape. Yolk sac:  Yes Embryo:  Yes Cardiac Activity: Yes Heart Rate: 121  bpm CRL:  4.1 mm   6 w   1 d                  [place] EDC: 01/31/2018 Subchorionic hemorrhage: A large amount of subchorionic hemorrhage is noted. Maternal uterus/adnexae: The ovaries are unremarkable in appearance. The right ovary measures 2.9 x 2.0 x 1.8 cm, while the left ovary measures 3.9 x 2.4 x 2.4 cm. No suspicious adnexal masses are seen; there is no evidence for ovarian torsion. Trace free fluid is seen within the pelvic cul-de-sac. IMPRESSION: 1. Single live intrauterine pregnancy noted, with a crown-rump length of 4 mm, corresponding to a gestational age of [redacted] weeks 1 day. This matches the gestational age of [redacted] weeks 0 days by LMP, reflecting an estimated date of delivery of February 01, 2018. 2. Large amount of subchorionic hemorrhage noted. Electronically Signed   By: Roanna Raider M.D.   On: 06/08/2017 01:54   US Ob Comp Less 14 Wks  Result Date: 06/03/2017 CLINICAL DATA:  Vomiting for 4 days. EXAM: OBSTETRIC <14 WK [place] AND TRANSVAGINAL OB US TECHNIQUE: Both transabdominal and transvaginal ultrasound examinations were performed for complete evaluation of the gestation as well as the maternal uterus, adnexal regions, and pelvic cul-de-sac. Transvaginal technique was performed to assess early pregnancy. COMPARISON:  Pelvic ultrasound 09/11/2014 FINDINGS: Intrauterine gestational sac: Single Yolk sac:  Present Embryo:  Not visualized Cardiac Activity: Not visualized MSD: 16.4  mm   6 w   3  d Subchorionic hemorrhage:  None visualized. Maternal uterus/adnexae: Unremarkable appearance of the right ovary. Small left ovarian cysts measure up to 2.4 cm in size. Trace pelvic free fluid. IMPRESSION: Intrauterine pregnancy with yolk sac but no embryo visualized. Findings are suspicious but not yet definitive for failed pregnancy. Recommend follow-up US in 10-14 days for  definitive diagnosis. This recommendation follows SRU consensus guidelines: Diagnostic Criteria for Nonviable Pregnancy Early in the First Trimester. Malva Limes Med 2013; 161:0960-45. Electronically Signed   By: Sebastian Ache M.D.   On: 06/03/2017 11:28   US Ob Transvaginal  Result Date: 06/08/2017 CLINICAL DATA:  Acute onset of lower abdominal pain, nausea and vomiting. Initial encounter. EXAM: OBSTETRIC <14 WK [place] AND TRANSVAGINAL OB US TECHNIQUE: Both transabdominal and transvaginal ultrasound examinations were performed for complete evaluation of the gestation as well as the maternal uterus, adnexal regions, and pelvic cul-de-sac. Transvaginal technique was performed to assess early pregnancy. COMPARISON:  Pelvic ultrasound performed 06/03/2017 FINDINGS: Intrauterine gestational sac: Single; visualized and normal in shape. Yolk sac:  Yes Embryo:  Yes Cardiac Activity: Yes Heart Rate: 121  bpm CRL:  4.1 mm   6 w   1 d  US EDC: 01/31/2018 Subchorionic hemorrhage: A large amount of subchorionic hemorrhage is noted. Maternal uterus/adnexae: The ovaries are unremarkable in appearance. The right ovary measures 2.9 x 2.0 x 1.8 cm, while the left ovary measures 3.9 x 2.4 x 2.4 cm. No suspicious adnexal masses are seen; there is no evidence for ovarian torsion. Trace free fluid is seen within the pelvic cul-de-sac. IMPRESSION: 1. Single live intrauterine pregnancy noted, with a crown-rump length of 4 mm, corresponding to a gestational age of [redacted] weeks 1 day. This matches the gestational age of [redacted] weeks 0 days by LMP, reflecting an estimated date of delivery of February 01, 2018. 2. Large amount of subchorionic hemorrhage noted. Electronically Signed   By: Roanna RaiderJeffery  Chang M.D.   On: 06/08/2017 01:54   [place] Ob Transvaginal  Result Date: 06/03/2017 CLINICAL DATA:  Vomiting for 4 days. EXAM: OBSTETRIC <14 WK US AND TRANSVAGINAL OB US TECHNIQUE: Both transabdominal and transvaginal ultrasound examinations were  performed for complete evaluation of the gestation as well as the maternal uterus, adnexal regions, and pelvic cul-de-sac. Transvaginal technique was performed to assess early pregnancy. COMPARISON:  Pelvic ultrasound 09/11/2014 FINDINGS: Intrauterine gestational sac: Single Yolk sac:  Present Embryo:  Not visualized Cardiac Activity: Not visualized MSD: 16.4  mm   6 w   3  d Subchorionic hemorrhage:  None visualized. Maternal uterus/adnexae: Unremarkable appearance of the right ovary. Small left ovarian cysts measure up to 2.4 cm in size. Trace pelvic free fluid. IMPRESSION: Intrauterine pregnancy with yolk sac but no embryo visualized. Findings are suspicious but not yet definitive for failed pregnancy. Recommend follow-up US in 10-14 days for definitive diagnosis. This recommendation follows SRU consensus guidelines: Diagnostic Criteria for Nonviable Pregnancy Early in the First Trimester. Malva Limes Engl J Med 2013; 478:2956-21; 369:1443-51. Electronically Signed   By: Sebastian AcheAllen  Grady M.D.   On: 06/03/2017 11:28   MDM Appears to be n/v of pregnancy. Well appearing, normal vitals, benign abdominal exam. Has received zofran. Will treat with IV hydration and check cmp/lipase. If labs unremarkable and pt passes po challenge, will plan to d/c home with oral phenergan.  19:50: refused blood draw  20:00 sign-out to Dr. Erskine EmeryMumaw   [place] Delio B Zoe Goonan 7/10/20187:54 PM  7:56 PM - Resumed care for patient from Dr. Ashok PallWouk. Patient is receiving second liter bolus of IVF, s/p phenergan, if tolerates PO trial, will d/c home with phenergan.   Cleda ClarksElizabeth W. Mumaw, DO  MAU Provider

## 2017-07-05 ENCOUNTER — Encounter: Payer: Medicaid Other | Admitting: Obstetrics & Gynecology

## 2017-07-06 ENCOUNTER — Encounter: Payer: Medicaid Other | Admitting: Obstetrics

## 2017-07-26 ENCOUNTER — Encounter: Payer: Self-pay | Admitting: Obstetrics

## 2017-07-26 ENCOUNTER — Other Ambulatory Visit (HOSPITAL_COMMUNITY)
Admission: RE | Admit: 2017-07-26 | Discharge: 2017-07-26 | Disposition: A | Discharge status: Home / Self Care | Payer: Medicaid Other | Source: Ambulatory Visit | Attending: Obstetrics | Admitting: Obstetrics

## 2017-07-26 ENCOUNTER — Ambulatory Visit (INDEPENDENT_AMBULATORY_CARE_PROVIDER_SITE_OTHER): Payer: Medicaid Other | Admitting: Obstetrics

## 2017-07-26 VITALS — BP 115/66 | HR 99 | Wt 162.5 lb

## 2017-07-26 DIAGNOSIS — Z3481 Encounter for supervision of other normal pregnancy, first trimester: Secondary | ICD-10-CM

## 2017-07-26 DIAGNOSIS — Z3A13 13 weeks gestation of pregnancy: Secondary | ICD-10-CM | POA: Insufficient documentation

## 2017-07-26 DIAGNOSIS — O219 Vomiting of pregnancy, unspecified: Secondary | ICD-10-CM

## 2017-07-26 DIAGNOSIS — M545 Low back pain, unspecified: Secondary | ICD-10-CM

## 2017-07-26 DIAGNOSIS — Z3480 Encounter for supervision of other normal pregnancy, unspecified trimester: Secondary | ICD-10-CM

## 2017-07-26 DIAGNOSIS — Z3483 Encounter for supervision of other normal pregnancy, third trimester: Secondary | ICD-10-CM

## 2017-07-26 DIAGNOSIS — Z3482 Encounter for supervision of other normal pregnancy, second trimester: Secondary | ICD-10-CM | POA: Insufficient documentation

## 2017-07-26 DIAGNOSIS — N898 Other specified noninflammatory disorders of vagina: Secondary | ICD-10-CM

## 2017-07-26 DIAGNOSIS — O9989 Other specified diseases and conditions complicating pregnancy, childbirth and the puerperium: Secondary | ICD-10-CM

## 2017-07-26 MED ORDER — DOXYLAMINE-PYRIDOXINE 10-10 MG PO TBEC: DELAYED_RELEASE_TABLET | ORAL | 5 refills | Status: DC

## 2017-07-26 MED ORDER — CYCLOBENZAPRINE HCL 10 MG PO TABS: 10.0000 mg | ORAL_TABLET | Freq: Three times a day (TID) | ORAL | 1 refills | Status: DC | PRN
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
## 2017-07-26 NOTE — Patient Instructions (Addendum)
Back Pain in Pregnancy Back pain during pregnancy is common. Back pain may be caused by several factors that are related to changes during your pregnancy. Follow these instructions at home: Managing pain, stiffness, and swelling  If directed, apply ice for sudden (acute) back pain. ? Put ice in a plastic bag. ? Place a towel between your skin and the bag. ? Leave the ice on for 20 minutes, 2-3 times per day.  If directed, apply heat to the affected area before you exercise: ? Place a towel between your skin and the heat pack or heating pad. ? Leave the heat on for 20-30 minutes. ? Remove the heat if your skin turns bright red. This is especially important if you are unable to feel pain, heat, or cold. You may have a greater risk of getting burned. Activity  Exercise as told by your health care provider. Exercising is the best way to prevent or manage back pain.  Listen to your body when lifting. If lifting hurts, ask for help or bend your knees. This uses your leg muscles instead of your back muscles.  Squat down when picking up something from the floor. Do not bend over.  Only use bed rest as told by your health care provider. Bed rest should only be used for the most severe episodes of back pain. Standing, Sitting, and Lying Down  Do not stand in one place for long periods of time.  Use good posture when sitting. Make sure your head rests over your shoulders and is not hanging forward. Use a pillow on your lower back if necessary.  Try sleeping on your side, preferably the left side, with a pillow or two between your legs. If you are sore after a night's rest, your bed may be too soft. A firm mattress may provide more support for your back during pregnancy. General instructions  Do not wear high heels.  Eat a healthy diet. Try to gain weight within your health care provider's recommendations.  Use a maternity girdle, elastic sling, or back brace as told by your health care  provider.  Take over-the-counter and prescription medicines only as told by your health care provider.  Keep all follow-up visits as told by your health care provider. This is important. This includes any visits with any specialists, such as a physical therapist. Contact a health care provider if:  Your back pain interferes with your daily activities.  You have increasing pain in other parts of your body. Get help right away if:  You develop numbness, tingling, weakness, or problems with the use of your arms or legs.  You develop severe back pain that is not controlled with medicine.  You have a sudden change in bowel or bladder control.  You develop shortness of breath, dizziness, or you faint.  You develop nausea, vomiting, or sweating.  You have back pain that is a rhythmic, cramping pain similar to labor pains. Labor pain is usually 1-2 minutes apart, lasts for about 1 minute, and involves a bearing down feeling or pressure in your pelvis.  You have back pain and your water breaks or you have vaginal bleeding.  You have back pain or numbness that travels down your leg.  Your back pain developed after you fell.  You develop pain on one side of your back.  You see blood in your urine.  You develop skin blisters in the area of your back pain. This information is not intended to replace advice given to you   by your health care provider. Make sure you discuss any questions you have with your health care provider. Document Released: 03/17/2006 Document Revised: 05/14/2016 Document Reviewed: 08/21/2015 Elsevier Interactive Patient Education  2018 [employer]  Eating Plan for Hyperemesis Gravidarum Hyperemesis gravidarum is a severe form of morning sickness. Because this condition causes severe nausea and vomiting, it can lead to dehydration, malnutrition, and weight loss. One way to lessen the symptoms of nausea and vomiting is to follow the eating plan for hyperemesis  gravidarum. It is often used along with prescribed medicines to control your symptoms. What can I do to relieve my symptoms? Listen to your body. Everyone is different and has different preferences. Find what works best for you. Take any of the following actions that are helpful to you:  Eat and drink slowly.  Eat 5-6 small meals daily instead of 3 large meals.  Eat crackers before you get out of bed in the morning.  Try having a snack in the middle of the night.  Starchy foods are usually tolerated well. Examples include cereal, toast, bread, potatoes, pasta, rice, and pretzels.  Ginger may help with nausea. Add  tsp ground ginger to hot tea or choose ginger tea.  Try drinking 100% fruit juice or an electrolyte drink. An electrolyte drink contains sodium, potassium, and chloride.  Continue to take your prenatal vitamins as told by your health care provider. If you are having trouble taking your prenatal vitamins, talk with your health care provider about different options.  Include at least 1 serving of protein with your meals and snacks. Protein options include meats or poultry, beans, nuts, eggs, and yogurt. Try eating a protein-rich snack before bed. Examples of these snacks include cheese and crackers or half of a peanut butter or [place] sandwich.  Consider eliminating foods that trigger your symptoms. These may include spicy foods, coffee, high-fat foods, very sweet foods, and acidic foods.  Try meals that have more protein combined with bland, salty, lower-fat, and dry foods, such as nuts, seeds, pretzels, crackers, and cereal.  Talk with your healthcare provider about starting a supplement of vitamin B6.  Have fluids that are cold, clear, and carbonated or sour. Examples include lemonade, ginger ale, lemon-lime soda, ice water, and sparkling water.  Try lemon or mint tea.  Try brushing your teeth or using a mouth rinse after meals.  What should I avoid to reduce my  symptoms? Avoiding some of the following things may help reduce your symptoms.  Foods with strong smells. Try eating meals in well-ventilated areas that are free of odors.  Drinking water or other beverages with meals. Try not to drink anything during the 30 minutes before and after your meals.  Drinking more than 1 cup of fluid at a time. Sometimes using a straw helps.  Fried or high-fat foods, such as butter and cream sauces.  Spicy foods.  Skipping meals as best as you can. Nausea can be more intense on an empty stomach. If you cannot tolerate food at that time, do not force it. Try sucking on ice chips or other frozen items, and make up for missed calories later.  Lying down within 2 hours after eating.  Environmental triggers. These may include smoky rooms, closed spaces, rooms with strong smells, warm or humid places, overly loud and noisy rooms, and rooms with motion or flickering lights.  Quick and sudden changes in your movement.  This information is not intended to replace advice given to you by your  health care provider. Make sure you discuss any questions you have with your health care provider. Document Released: 10/04/2007 Document Revised: 08/05/2016 Document Reviewed: 07/07/2016 Elsevier Interactive Patient Education  2018 [employer].  Hyperemesis Gravidarum Hyperemesis gravidarum is a severe form of nausea and vomiting that happens during pregnancy. Hyperemesis is worse than morning sickness. It may cause you to have nausea or vomiting all day for many days. It may keep you from eating and drinking enough food and liquids. Hyperemesis usually occurs during the first half (the first 20 weeks) of pregnancy. It often goes away once a woman is in her second half of pregnancy. However, sometimes hyperemesis continues through an entire pregnancy. What are the causes? The cause of this condition is not known. It may be related to changes in chemicals (hormones) in the body  during pregnancy, such as the high level of pregnancy hormone (human chorionic gonadotropin) or the increase in the female sex hormone (estrogen). What are the signs or symptoms? Symptoms of this condition include:  Severe nausea and vomiting.  Nausea that does not go away.  Vomiting that does not allow you to keep any food down.  Weight loss.  Body fluid loss (dehydration).  Having no desire to eat, or not liking food that you have previously enjoyed.  How is this diagnosed? This condition may be diagnosed based on:  A physical exam.  Your medical history.  Your symptoms.  Blood tests.  Urine tests.  How is this treated? This condition may be managed with medicine. If medicines to do not help relieve nausea and vomiting, you may need to receive fluids through an IV tube at the hospital. Follow these instructions at home:  Take over-the-counter and prescription medicines only as told by your health care provider.  Avoid iron pills and multivitamins that contain iron for the first 3-4 months of pregnancy. If you take prescription iron pills, do not stop taking them unless your health care provider approves.  Take the following actions to help prevent nausea and vomiting: ? In the morning, before getting out of bed, try eating a couple of dry crackers or a piece of toast. ? Avoid foods and smells that upset your stomach. Fatty and spicy foods may make nausea worse. ? Eat 5-6 small meals a day. ? Do not drink fluids while eating meals. Drink between meals. ? Eat or suck on things that have ginger in them. Ginger can help relieve nausea. ? Avoid food preparation. The smell of food can spoil your appetite or trigger nausea.  Follow instructions from your health care provider about eating or drinking restrictions.  For snacks, eat high-protein foods, such as cheese.  Keep all follow-up and pre-birth (prenatal) visits as told by your health care provider. This is  important. Contact a health care provider if:  You have pain in your abdomen.  You have a severe headache.  You have vision problems.  You are losing weight. Get help right away if:  You cannot drink fluids without vomiting.  You vomit blood.  You have constant nausea and vomiting.  You are very weak.  You are very thirsty.  You feel dizzy.  You faint.  You have a fever or other symptoms that last for more than 2-3 days.  You have a fever and your symptoms suddenly get worse. Summary  Hyperemesis gravidarum is a severe form of nausea and vomiting that happens during pregnancy.  Making some changes to your eating habits may help relieve nausea  and vomiting.  This condition may be managed with medicine.  If medicines to do not help relieve nausea and vomiting, you may need to receive fluids through an IV tube at the hospital. This information is not intended to replace advice given to you by your health care provider. Make sure you discuss any questions you have with your health care provider. Document Released: 12/07/2005 Document Revised: 08/05/2016 Document Reviewed: 08/05/2016 Elsevier Interactive Patient Education  2017 [employer] Inc.  Morning Sickness Morning sickness is when you feel sick to your stomach (nauseous) during pregnancy. This nauseous feeling may or may not come with vomiting. It often occurs in the morning but can be a problem any time of day. Morning sickness is most common during the first trimester, but it may continue throughout pregnancy. While morning sickness is unpleasant, it is usually harmless unless you develop severe and continual vomiting (hyperemesis gravidarum). This condition requires more intense treatment. What are the causes? The cause of morning sickness is not completely known but seems to be related to normal hormonal changes that occur in pregnancy. What increases the risk? You are at greater risk if you:  Experienced nausea or  vomiting before your pregnancy.  Had morning sickness during a previous pregnancy.  Are pregnant with more than one baby, such as twins.  How is this treated? Do not use any medicines (prescription, over-the-counter, or herbal) for morning sickness without first talking to your health care provider. Your health care provider may prescribe or recommend:  Vitamin B6 supplements.  Anti-nausea medicines.  The herbal medicine ginger.  Follow these instructions at home:  Only take over-the-counter or prescription medicines as directed by your health care provider.  Taking multivitamins before getting pregnant can prevent or decrease the severity of morning sickness in most women.  Eat a piece of dry toast or unsalted crackers before getting out of bed in the morning.  Eat five or six small meals a day.  Eat dry and bland foods (rice, baked potato). Foods high in carbohydrates are often helpful.  Do not drink liquids with your meals. Drink liquids between meals.  Avoid greasy, fatty, and spicy foods.  Get someone to cook for you if the smell of any food causes nausea and vomiting.  If you feel nauseous after taking prenatal vitamins, take the vitamins at night or with a snack.  Snack on protein foods (nuts, yogurt, cheese) between meals if you are hungry.  Eat unsweetened gelatins for desserts.  Wearing an acupressure wristband (worn for sea sickness) may be helpful.  Acupuncture may be helpful.  Do not smoke.  Get a humidifier to keep the air in your house free of odors.  Get plenty of fresh air. Contact a health care provider if:  Your home remedies are not working, and you need medicine.  You feel dizzy or lightheaded.  You are losing weight. Get help right away if:  You have persistent and uncontrolled nausea and vomiting.  You pass out (faint). This information is not intended to replace advice given to you by your health care provider. Make sure you discuss  any questions you have with your health care provider. Document Released: 01/28/2007 Document Revised: 05/14/2016 Document Reviewed: 05/24/2013 Elsevier Interactive Patient Education  2017 [employer] Inc.  Subchorionic Hematoma A subchorionic hematoma is a gathering of blood between the outer wall of the placenta and the inner wall of the womb (uterus). The placenta is the organ that connects the fetus to the wall of the  uterus. The placenta performs the feeding, breathing (oxygen to the fetus), and waste removal (excretory work) of the fetus. Subchorionic hematoma is the most common abnormality found on a result from ultrasonography done during the first trimester or early second trimester of pregnancy. If there has been little or no vaginal bleeding, early small hematomas usually shrink on their own and do not affect your baby or pregnancy. The blood is gradually absorbed over 1-2 weeks. When bleeding starts later in pregnancy or the hematoma is larger or occurs in an older pregnant woman, the outcome may not be as good. Larger hematomas may get bigger, which increases the chances for miscarriage. Subchorionic hematoma also increases the risk of premature detachment of the placenta from the uterus, preterm (premature) labor, and stillbirth. Follow these instructions at home:  Stay on bed rest if your health care provider recommends this. Although bed rest will not prevent more bleeding or prevent a miscarriage, your health care provider may recommend bed rest until you are advised otherwise.  Avoid heavy lifting (more than 10 lb [4.5 kg]), exercise, sexual intercourse, or douching as directed by your health care provider.  Keep track of the number of pads you use each day and how soaked (saturated) they are. Write down this information.  Do not use tampons.  Keep all follow-up appointments as directed by your health care provider. Your health care provider may ask you to have follow-up blood tests  or ultrasound tests or both. Get help right away if:  You have severe cramps in your stomach, back, abdomen, or pelvis.  You have a fever.  You pass large clots or tissue. Save any tissue for your health care provider to look at.  Your bleeding increases or you become lightheaded, feel weak, or have fainting episodes. This information is not intended to replace advice given to you by your health care provider. Make sure you discuss any questions you have with your health care provider. Document Released: 03/24/2007 Document Revised: 05/14/2016 Document Reviewed: 07/06/2013 Elsevier Interactive Patient Education  2017 [employer].

## 2017-07-27 ENCOUNTER — Telehealth: Payer: Self-pay | Admitting: *Deleted

## 2017-07-27 LAB — CERVICOVAGINAL ANCILLARY ONLY
BACTERIAL VAGINITIS: NEGATIVE
CHLAMYDIA, DNA PROBE: NEGATIVE
Candida vaginitis: NEGATIVE
NEISSERIA GONORRHEA: NEGATIVE
TRICH (WINDOWPATH): NEGATIVE

## 2017-07-27 NOTE — Telephone Encounter (Signed)
PA for Diclegis submitted and approved. Pharmacy made aware. 

## 2017-07-28 LAB — CYTOLOGY - PAP
ADEQUACY: ABSENT
Diagnosis: NEGATIVE
HPV (WINDOPATH): NOT DETECTED

## 2017-07-28 LAB — URINE CULTURE, OB REFLEX

## 2017-07-28 LAB — CULTURE, OB URINE

## 2017-07-29 ENCOUNTER — Other Ambulatory Visit: Payer: Self-pay | Admitting: Obstetrics

## 2017-07-29 DIAGNOSIS — E559 Vitamin D deficiency, unspecified: Secondary | ICD-10-CM

## 2017-07-29 LAB — OBSTETRIC PANEL, INCLUDING HIV
Antibody Screen: NEGATIVE
BASOS: 0 %
Basophils Absolute: 0 10*3/uL (ref 0.0–0.2)
EOS (ABSOLUTE): 0.1 10*3/uL (ref 0.0–0.4)
EOS: 1 %
HEMATOCRIT: 31.7 % — AB (ref 34.0–46.6)
HEMOGLOBIN: 10.3 g/dL — AB (ref 11.1–15.9)
HIV SCREEN 4TH GENERATION: NONREACTIVE
Hepatitis B Surface Ag: NEGATIVE
IMMATURE GRANS (ABS): 0 10*3/uL (ref 0.0–0.1)
Immature Granulocytes: 0 %
LYMPHS: 25 %
Lymphocytes Absolute: 2.2 10*3/uL (ref 0.7–3.1)
MCH: 26.4 pg — ABNORMAL LOW (ref 26.6–33.0)
MCHC: 32.5 g/dL (ref 31.5–35.7)
MCV: 81 fL (ref 79–97)
MONOCYTES: 8 %
Monocytes Absolute: 0.7 10*3/uL (ref 0.1–0.9)
NEUTROS ABS: 5.7 10*3/uL (ref 1.4–7.0)
Neutrophils: 66 %
Platelets: 211 10*3/uL (ref 150–379)
RBC: 3.9 x10E6/uL (ref 3.77–5.28)
RDW: 15.4 % (ref 12.3–15.4)
RH TYPE: NEGATIVE
RPR Ser Ql: NONREACTIVE
RUBELLA: 7.76 {index} (ref >0.99)
WBC: 8.8 10*3/uL (ref 3.4–10.8)

## 2017-07-29 LAB — VARICELLA ZOSTER ANTIBODY, IGG: Varicella zoster IgG: >4000 index (ref >165)

## 2017-07-29 LAB — HEMOGLOBIN A1C
ESTIMATED AVERAGE GLUCOSE: 108 mg/dL
HEMOGLOBIN A1C: 5.4 % (ref 4.8–5.6)

## 2017-07-29 LAB — HEMOGLOBINOPATHY EVALUATION
HEMOGLOBIN A2 QUANTITATION: 2.4 % (ref 1.8–3.2)
HGB C: 0 %
HGB S: 0 %
HGB VARIANT: 0 %
Hemoglobin F Quantitation: 0 % (ref 0.0–2.0)
Hgb A: 97.6 % (ref 96.4–98.8)

## 2017-07-29 LAB — VITAMIN D 25 HYDROXY (VIT D DEFICIENCY, FRACTURES): Vit D, 25-Hydroxy: 12.4 ng/mL — ABNORMAL LOW (ref 30.0–100.0)

## 2017-07-29 MED ORDER — VITAMIN D 50 MCG (2000 UT) PO CAPS: 1.0000 | ORAL_CAPSULE | Freq: Every day | ORAL | 5 refills | Status: DC

## 2017-08-03 LAB — CYSTIC FIBROSIS MUTATION 97: GENE DIS ANAL CARRIER INTERP BLD/T-IMP: NOT DETECTED

## 2017-08-09 ENCOUNTER — Ambulatory Visit (INDEPENDENT_AMBULATORY_CARE_PROVIDER_SITE_OTHER): Payer: Medicaid Other | Admitting: Obstetrics

## 2017-08-09 ENCOUNTER — Encounter: Payer: Self-pay | Admitting: Obstetrics

## 2017-08-09 VITALS — BP 119/76 | HR 94 | Wt 168.9 lb

## 2017-08-09 DIAGNOSIS — M545 Low back pain, unspecified: Secondary | ICD-10-CM

## 2017-08-09 DIAGNOSIS — Z3480 Encounter for supervision of other normal pregnancy, unspecified trimester: Secondary | ICD-10-CM

## 2017-08-09 DIAGNOSIS — Z3482 Encounter for supervision of other normal pregnancy, second trimester: Secondary | ICD-10-CM

## 2017-08-09 MED ORDER — PRENATE MINI 29-0.6-0.4-350 MG PO CAPS: 1.0000 | ORAL_CAPSULE | Freq: Every day | ORAL | 4 refills | Status: DC

## 2017-08-09 NOTE — Progress Notes (Signed)
Patient is feeling movement- she does have pressure on her bladder.

## 2017-08-09 NOTE — Progress Notes (Signed)
Subjective:  Krystal Murray is a 26 y.o. [phone] at [redacted]w[redacted]d being seen today for ongoing prenatal care.  She is currently monitored for the following issues for this low-risk pregnancy and has Tubo-ovarian abscess and Supervision of other normal pregnancy, antepartum on her problem list.  Patient reports backache.  Contractions: Not present. Vag. Bleeding: None.  Movement: Present. Denies leaking of fluid.   The following portions of the patient's history were reviewed and updated as appropriate: allergies, current medications, past family history, past medical history, past social history, past surgical history and problem list. Problem list updated.  Objective:   Vitals:   08/09/17 1015  BP: 119/76  Pulse: 94  Weight: 168 lb 14.4 oz (76.6 kg)    Fetal Status: Fetal Heart Rate (bpm): 150   Movement: Present     General:  Alert, oriented and cooperative. Patient is in no acute distress.  Skin: Skin is warm and dry. No rash noted.   Cardiovascular: Normal heart rate noted  Respiratory: Normal respiratory effort, no problems with respiration noted  Abdomen: Soft, gravid, appropriate for gestational age. Pain/Pressure: Absent     Pelvic:  Cervical exam deferred        Extremities: Normal range of motion.  Edema: None  Mental Status: Normal mood and affect. Normal behavior. Normal judgment and thought content.   Urinalysis:      Assessment and Plan:  Pregnancy: [id number] at [redacted]w[redacted]d  1. Supervision of other normal pregnancy, antepartum Rx - [place] MFM OB COMP + 14 WK; Future - Prenat w/o A-FeCbn-Meth-FA-DHA (PRENATE MINI) 29-0.6-0.4-350 MG CAPS; Take 1 capsule by mouth daily before breakfast.  Dispense: 90 capsule; Refill: 4  2. Acute midline low back pain without sciatica - improved with Flexeril / Tylenol   Preterm labor symptoms and general obstetric precautions including but not limited to vaginal bleeding, contractions, leaking of fluid and fetal movement were reviewed in detail  with the patient. Please refer to After Visit Summary for other counseling recommendations.  Return in about 4 weeks (around 09/06/2017) for ROB.   Brock Bad, MDPatient ID: Krystal Murray, female   DOB: 1991/12/13, 26 y.o.   MRN: [id number]

## 2017-08-19 ENCOUNTER — Encounter (HOSPITAL_COMMUNITY): Payer: Self-pay | Admitting: Emergency Medicine

## 2017-08-19 ENCOUNTER — Emergency Department (HOSPITAL_COMMUNITY)
Admission: EM | Admit: 2017-08-19 | Discharge: 2017-08-20 | Discharge status: Against Medical Advice | Payer: Medicaid Other | Attending: Physician Assistant | Admitting: Physician Assistant

## 2017-08-19 ENCOUNTER — Emergency Department (HOSPITAL_COMMUNITY): Payer: Medicaid Other

## 2017-08-19 DIAGNOSIS — R109 Unspecified abdominal pain: Secondary | ICD-10-CM | POA: Insufficient documentation

## 2017-08-19 DIAGNOSIS — O26891 Other specified pregnancy related conditions, first trimester: Secondary | ICD-10-CM | POA: Insufficient documentation

## 2017-08-19 DIAGNOSIS — Z041 Encounter for examination and observation following transport accident: Secondary | ICD-10-CM | POA: Diagnosis not present

## 2017-08-19 DIAGNOSIS — Z3A01 Less than 8 weeks gestation of pregnancy: Secondary | ICD-10-CM | POA: Insufficient documentation

## 2017-08-19 MED ORDER — RHO D IMMUNE GLOBULIN 1500 UNIT/2ML IJ SOSY
300.0000 ug | PREFILLED_SYRINGE | Freq: Once | INTRAMUSCULAR | Status: AC
  Administered 2017-08-20: 300 ug via INTRAMUSCULAR

## 2017-08-19 MED ORDER — RHO D IMMUNE GLOBULIN 1500 UNIT/2ML IJ SOSY: 300.0000 ug | PREFILLED_SYRINGE | Freq: Once | INTRAMUSCULAR | Status: DC

## 2017-08-19 NOTE — ED Notes (Signed)
No response in the lobby. 
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
## 2017-08-19 NOTE — ED Notes (Signed)
Pt [redacted] weeks pregnant. Denies abdominal pain, leaking of fluid or vaginal bleeding. Fetal heart tones 141-150bpm.

## 2017-08-19 NOTE — ED Notes (Signed)
Update to Triage Note- patient is [redacted] weeks pregnant.

## 2017-08-19 NOTE — ED Notes (Signed)
Pt checked in with me, I apologized to her and management spoke with pt. Pt states that she is very hungry.  Pt is ambulatory

## 2017-08-19 NOTE — ED Notes (Signed)
Attempted to call Pharmacy (main and ED) and also blood bank x 2 and unable to figure out delays/confusion with Rhogam order.  MD on phone with blood bank at this time

## 2017-08-19 NOTE — Discharge Instructions (Signed)
Follow-up with your OBGYN
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
## 2017-08-20 LAB — ABO/RH: ABO/RH(D): A NEG

## 2017-08-21 LAB — RH IG WORKUP (INCLUDES ABO/RH)
ABO/RH(D): A NEG
Antibody Screen: NEGATIVE
Gestational Age(Wks): 16
UNIT DIVISION: 0

## 2017-09-06 ENCOUNTER — Encounter: Payer: Medicaid Other | Admitting: Obstetrics

## 2017-09-06 ENCOUNTER — Ambulatory Visit (HOSPITAL_COMMUNITY): Payer: Medicaid Other

## 2017-09-10 ENCOUNTER — Encounter: Payer: Self-pay | Admitting: Obstetrics

## 2017-09-10 ENCOUNTER — Other Ambulatory Visit: Payer: Self-pay | Admitting: Obstetrics

## 2017-09-10 ENCOUNTER — Ambulatory Visit (INDEPENDENT_AMBULATORY_CARE_PROVIDER_SITE_OTHER): Payer: Medicaid Other | Admitting: Obstetrics

## 2017-09-10 ENCOUNTER — Ambulatory Visit (HOSPITAL_COMMUNITY)
Admission: RE | Admit: 2017-09-10 | Discharge: 2017-09-10 | Disposition: A | Discharge status: Home / Self Care | Payer: Medicaid Other | Source: Ambulatory Visit | Attending: Obstetrics | Admitting: Obstetrics

## 2017-09-10 DIAGNOSIS — Z369 Encounter for antenatal screening, unspecified: Secondary | ICD-10-CM

## 2017-09-10 DIAGNOSIS — Z3480 Encounter for supervision of other normal pregnancy, unspecified trimester: Secondary | ICD-10-CM

## 2017-09-10 DIAGNOSIS — Z363 Encounter for antenatal screening for malformations: Secondary | ICD-10-CM | POA: Diagnosis present

## 2017-09-10 DIAGNOSIS — Z3A19 19 weeks gestation of pregnancy: Secondary | ICD-10-CM

## 2017-09-10 DIAGNOSIS — Z3482 Encounter for supervision of other normal pregnancy, second trimester: Secondary | ICD-10-CM

## 2017-09-10 NOTE — Progress Notes (Signed)
Subjective:  Krystal Murray is a 26 y.o. [id number] at [redacted]w[redacted]d being seen today for ongoing prenatal care.  She is currently monitored for the following issues for this low-risk pregnancy and has Supervision of other normal pregnancy, antepartum on her problem list.  Patient reports no complaints.  Contractions: Not present. Vag. Bleeding: None.  Movement: Present. Denies leaking of fluid.   The following portions of the patient's history were reviewed and updated as appropriate: allergies, current medications, past family history, past medical history, past social history, past surgical history and problem list. Problem list updated.  Objective:   Vitals:   09/10/17 1055  BP: 136/84  Pulse: 81  Weight: 184 lb 3.2 oz (83.6 kg)    Fetal Status:     Movement: Present     General:  Alert, oriented and cooperative. Patient is in no acute distress.  Skin: Skin is warm and dry. No rash noted.   Cardiovascular: Normal heart rate noted  Respiratory: Normal respiratory effort, no problems with respiration noted  Abdomen: Soft, gravid, appropriate for gestational age. Pain/Pressure: Absent     Pelvic:  Cervical exam deferred        Extremities: Normal range of motion.  Edema: None  Mental Status: Normal mood and affect. Normal behavior. Normal judgment and thought content.   Urinalysis:      Assessment and Plan:  Pregnancy: [id number] at [redacted]w[redacted]d  1. Supervision of other normal pregnancy, antepartum   Preterm labor symptoms and general obstetric precautions including but not limited to vaginal bleeding, contractions, leaking of fluid and fetal movement were reviewed in detail with the patient. Please refer to After Visit Summary for other counseling recommendations.  Return in about 4 weeks (around 10/08/2017) for ROB.   Brock Bad, MD

## 2017-09-10 NOTE — Progress Notes (Signed)
Patient reports feeling fetal movement, denies pain. 

## 2017-09-10 NOTE — Addendum Note (Signed)
Encounter addended by: Allegra Lai, MD on: 09/10/2017  1:36 PM<BR>    Actions taken: Charge Capture section accepted

## 2017-10-08 ENCOUNTER — Encounter: Payer: Medicaid Other | Admitting: Obstetrics

## 2017-10-11 ENCOUNTER — Encounter: Payer: Self-pay | Admitting: Obstetrics

## 2017-10-11 ENCOUNTER — Ambulatory Visit (INDEPENDENT_AMBULATORY_CARE_PROVIDER_SITE_OTHER): Payer: Medicaid Other | Admitting: Obstetrics

## 2017-10-11 VITALS — BP 126/82 | HR 80 | Wt 202.8 lb

## 2017-10-11 DIAGNOSIS — Z23 Encounter for immunization: Secondary | ICD-10-CM

## 2017-10-11 DIAGNOSIS — Z3482 Encounter for supervision of other normal pregnancy, second trimester: Secondary | ICD-10-CM

## 2017-10-11 DIAGNOSIS — Z3480 Encounter for supervision of other normal pregnancy, unspecified trimester: Secondary | ICD-10-CM

## 2017-10-11 NOTE — Progress Notes (Signed)
Pt states she needs refill on vitamin.

## 2017-10-11 NOTE — Progress Notes (Signed)
Subjective:  Krystal Murray is a 26 y.o. [phone] at 576w0d being seen today for ongoing prenatal care.  She is currently monitored for the following issues for this low-risk pregnancy and has Supervision of other normal pregnancy, antepartum on her problem list.  Patient reports no complaints.  Contractions: Not present. Vag. Bleeding: None.  Movement: Present. Denies leaking of fluid.   The following portions of the patient's history were reviewed and updated as appropriate: allergies, current medications, past family history, past medical history, past social history, past surgical history and problem list. Problem list updated.  Objective:   Vitals:   10/11/17 0901  BP: 126/82  Pulse: 80  Weight: 202 lb 12.8 oz (92 kg)    Fetal Status: Fetal Heart Rate (bpm): 150   Movement: Present     General:  Alert, oriented and cooperative. Patient is in no acute distress.  Skin: Skin is warm and dry. No rash noted.   Cardiovascular: Normal heart rate noted  Respiratory: Normal respiratory effort, no problems with respiration noted  Abdomen: Soft, gravid, appropriate for gestational age. Pain/Pressure: Absent     Pelvic:  Cervical exam deferred        Extremities: Normal range of motion.  Edema: None  Mental Status: Normal mood and affect. Normal behavior. Normal judgment and thought content.   Urinalysis:      Assessment and Plan:  Pregnancy: [id number] at 3376w0d  1. Supervision of other normal pregnancy, antepartum  2. Need for vaccination Rx: - Flu Vaccine QUAD 36+ mos IM  Preterm labor symptoms and general obstetric precautions including but not limited to vaginal bleeding, contractions, leaking of fluid and fetal movement were reviewed in detail with the patient. Please refer to After Visit Summary for other counseling recommendations.  Return in about 4 weeks (around 11/08/2017) for ROB.   Brock BadHarper, Melvena Vink A, MD

## 2017-10-27 ENCOUNTER — Ambulatory Visit (INDEPENDENT_AMBULATORY_CARE_PROVIDER_SITE_OTHER): Payer: Medicaid Other | Admitting: Obstetrics

## 2017-10-27 ENCOUNTER — Other Ambulatory Visit: Payer: Self-pay

## 2017-10-27 ENCOUNTER — Encounter: Payer: Self-pay | Admitting: Obstetrics

## 2017-10-27 ENCOUNTER — Other Ambulatory Visit (HOSPITAL_COMMUNITY)
Admission: RE | Admit: 2017-10-27 | Discharge: 2017-10-27 | Disposition: A | Discharge status: Home / Self Care | Payer: Medicaid Other | Source: Ambulatory Visit | Attending: Obstetrics | Admitting: Obstetrics

## 2017-10-27 VITALS — BP 119/67 | HR 96 | Wt 207.9 lb

## 2017-10-27 DIAGNOSIS — Z3480 Encounter for supervision of other normal pregnancy, unspecified trimester: Secondary | ICD-10-CM

## 2017-10-27 DIAGNOSIS — N898 Other specified noninflammatory disorders of vagina: Secondary | ICD-10-CM | POA: Diagnosis not present

## 2017-10-27 DIAGNOSIS — Z3A36 36 weeks gestation of pregnancy: Secondary | ICD-10-CM | POA: Diagnosis not present

## 2017-10-27 DIAGNOSIS — G8929 Other chronic pain: Secondary | ICD-10-CM | POA: Diagnosis not present

## 2017-10-27 DIAGNOSIS — Z3483 Encounter for supervision of other normal pregnancy, third trimester: Secondary | ICD-10-CM | POA: Diagnosis not present

## 2017-10-27 DIAGNOSIS — M545 Low back pain: Secondary | ICD-10-CM

## 2017-10-27 DIAGNOSIS — Z3482 Encounter for supervision of other normal pregnancy, second trimester: Secondary | ICD-10-CM

## 2017-10-27 MED ORDER — FLUCONAZOLE 150 MG PO TABS: 150.0000 mg | ORAL_TABLET | Freq: Once | ORAL | 0 refills | Status: AC

## 2017-10-27 MED ORDER — METRONIDAZOLE 500 MG PO TABS: 500.0000 mg | ORAL_TABLET | Freq: Two times a day (BID) | ORAL | 2 refills | Status: DC

## 2017-10-27 MED ORDER — COMFORT FIT MATERNITY SUPP SM MISC: 1.0000 "application " | Freq: Every day | 0 refills | Status: DC

## 2017-10-27 NOTE — Progress Notes (Signed)
ROB, having tan discharge with odor, nausea, lower abdominal pain x 7 days 6/10.  Denies voting, fever, itching or urinary problems.

## 2017-10-27 NOTE — Progress Notes (Signed)
Subjective:  Krystal Donell SievertU Kisling is a 26 y.o. [phone] at 5697w2d being seen today for ongoing prenatal care.  She is currently monitored for the following issues for this low-risk pregnancy and has Supervision of other normal pregnancy, antepartum on their problem list.  Patient reports backache.  Contractions: Irritability. Vag. Bleeding: None.  Movement: Present. Denies leaking of fluid.   The following portions of the patient's history were reviewed and updated as appropriate: allergies, current medications, past family history, past medical history, past social history, past surgical history and problem list. Problem list updated.  Objective:   Vitals:   10/27/17 1053  BP: 119/67  Pulse: 96  Weight: 207 lb 14.4 oz (94.3 kg)    Fetal Status:     Movement: Present     General:  Alert, oriented and cooperative. Patient is in no acute distress.  Skin: Skin is warm and dry. No rash noted.   Cardiovascular: Normal heart rate noted  Respiratory: Normal respiratory effort, no problems with respiration noted  Abdomen: Soft, gravid, appropriate for gestational age. Pain/Pressure: Present     Pelvic:  Cervical exam deferred        Extremities: Normal range of motion.  Edema: Trace  Mental Status: Normal mood and affect. Normal behavior. Normal judgment and thought content.   Urinalysis:      Assessment and Plan:  Pregnancy: [id number] at 7297w2d  1. Supervision of other normal pregnancy, antepartum  2. Vaginal discharge Rx: - Cervicovaginal ancillary only - metroNIDAZOLE (FLAGYL) 500 MG tablet; Take 1 tablet (500 mg total) 2 (two) times daily by mouth.  Dispense: 14 tablet; Refill: 2 - fluconazole (DIFLUCAN) 150 MG tablet; Take 1 tablet (150 mg total) once for 1 dose by mouth.  Dispense: 1 tablet; Refill: 0  3. Chronic midline low back pain without sciatica Rx: - Elastic Bandages & Supports (COMFORT FIT MATERNITY SUPP SM) MISC; 1 application daily by Does not apply route.  Dispense: 1 each;  Refill: 0  Preterm labor symptoms and general obstetric precautions including but not limited to vaginal bleeding, contractions, leaking of fluid and fetal movement were reviewed in detail with the patient. Please refer to After Visit Summary for other counseling recommendations.  Return in about 2 weeks (around 11/10/2017) for ROB, 2 hour OGTT.   Brock BadHarper, Charles A, MD

## 2017-10-28 LAB — CERVICOVAGINAL ANCILLARY ONLY
BACTERIAL VAGINITIS: POSITIVE — AB
CHLAMYDIA, DNA PROBE: NEGATIVE
Candida vaginitis: POSITIVE — AB
NEISSERIA GONORRHEA: NEGATIVE
Trichomonas: NEGATIVE

## 2017-10-29 ENCOUNTER — Other Ambulatory Visit: Payer: Self-pay | Admitting: Obstetrics

## 2017-10-29 ENCOUNTER — Telehealth: Payer: Self-pay | Admitting: Pediatrics

## 2017-10-29 DIAGNOSIS — N760 Acute vaginitis: Secondary | ICD-10-CM

## 2017-10-29 DIAGNOSIS — B373 Candidiasis of vulva and vagina: Secondary | ICD-10-CM

## 2017-10-29 DIAGNOSIS — B3731 Acute candidiasis of vulva and vagina: Secondary | ICD-10-CM

## 2017-10-29 DIAGNOSIS — B9689 Other specified bacterial agents as the cause of diseases classified elsewhere: Secondary | ICD-10-CM

## 2017-10-29 MED ORDER — FLUCONAZOLE 150 MG PO TABS: 150.0000 mg | ORAL_TABLET | Freq: Once | ORAL | 0 refills | Status: AC

## 2017-10-29 MED ORDER — METRONIDAZOLE 500 MG PO TABS: 500.0000 mg | ORAL_TABLET | Freq: Two times a day (BID) | ORAL | 2 refills | Status: DC

## 2017-10-29 NOTE — Telephone Encounter (Signed)
I called pt to report lab results. She asked that I send Dr. Clearance CootsHarper a message letting him know she is eating baby powder and can not stop.  I advised patient to stop eating powder immediately. She states the cravings are strong and she can not stop. I advised again to stop, possibly harmful. She voiced understanding.  Next ROB 11/09/17

## 2017-11-08 ENCOUNTER — Other Ambulatory Visit: Payer: Medicaid Other

## 2017-11-08 ENCOUNTER — Encounter: Payer: Medicaid Other | Admitting: Obstetrics

## 2017-11-09 ENCOUNTER — Ambulatory Visit (INDEPENDENT_AMBULATORY_CARE_PROVIDER_SITE_OTHER): Payer: Medicaid Other | Admitting: Obstetrics

## 2017-11-09 ENCOUNTER — Encounter: Payer: Self-pay | Admitting: Obstetrics

## 2017-11-09 ENCOUNTER — Other Ambulatory Visit: Payer: Medicaid Other

## 2017-11-09 VITALS — BP 132/77 | HR 97 | Wt 212.4 lb

## 2017-11-09 DIAGNOSIS — R12 Heartburn: Secondary | ICD-10-CM

## 2017-11-09 DIAGNOSIS — O26899 Other specified pregnancy related conditions, unspecified trimester: Secondary | ICD-10-CM

## 2017-11-09 DIAGNOSIS — Z23 Encounter for immunization: Secondary | ICD-10-CM

## 2017-11-09 DIAGNOSIS — Z3483 Encounter for supervision of other normal pregnancy, third trimester: Secondary | ICD-10-CM

## 2017-11-09 DIAGNOSIS — O26893 Other specified pregnancy related conditions, third trimester: Secondary | ICD-10-CM

## 2017-11-09 DIAGNOSIS — Z3480 Encounter for supervision of other normal pregnancy, unspecified trimester: Secondary | ICD-10-CM

## 2017-11-09 MED ORDER — OMEPRAZOLE 20 MG PO CPDR: 20.0000 mg | DELAYED_RELEASE_CAPSULE | Freq: Two times a day (BID) | ORAL | 5 refills | Status: DC

## 2017-11-09 NOTE — Progress Notes (Signed)
Subjective:  Krystal Donell SievertU Krystal Murray is Murray 26 y.o. [phone] at 7173w1d being seen today for ongoing prenatal care.  She is currently monitored for the following issues for this low-risk pregnancy and has Supervision of other normal pregnancy, antepartum on their problem list.  Patient reports heartburn.  Contractions: Not present. Vag. Bleeding: None.  Movement: Present. Denies leaking of fluid.   The following portions of the patient's history were reviewed and updated as appropriate: allergies, current medications, past family history, past medical history, past social history, past surgical history and problem list. Problem list updated.  Objective:   Vitals:   11/09/17 0827  BP: 132/77  Pulse: 97  Weight: 212 lb 6.4 oz (96.3 kg)    Fetal Status:     Movement: Present     General:  Alert, oriented and cooperative. Patient is in no acute distress.  Skin: Skin is warm and dry. No rash noted.   Cardiovascular: Normal heart rate noted  Respiratory: Normal respiratory effort, no problems with respiration noted  Abdomen: Soft, gravid, appropriate for gestational age. Pain/Pressure: Present     Pelvic:  Cervical exam deferred        Extremities: Normal range of motion.  Edema: Trace  Mental Status: Normal mood and affect. Normal behavior. Normal judgment and thought content.   Urinalysis:      Assessment and Plan:  Pregnancy: [id number] at 2473w1d  1. Supervision of other normal pregnancy, antepartum Rx: - CBC - Glucose Tolerance, 2 Hours w/1 Hour - HIV antibody - RPR  2. Need for vaccination - Tdap given  3. Heartburn during pregnancy, antepartum Rx: - omeprazole (PRILOSEC) 20 MG capsule; Take 1 capsule (20 mg total) 2 (two) times daily before Murray meal by mouth.  Dispense: 60 capsule; Refill: 5 Preterm labor symptoms and general obstetric precautions including but not limited to vaginal bleeding, contractions, leaking of fluid and fetal movement were reviewed in detail with the  patient. Please refer to After Visit Summary for other counseling recommendations.  Return in about 2 weeks (around 11/23/2017) for ROB.   Krystal Murray, Krystal Rabon A, MD

## 2017-11-09 NOTE — Progress Notes (Signed)
Pt reports currently being treated for BV and yeast. She denies other concerns at this time.

## 2017-11-10 LAB — GLUCOSE TOLERANCE, 2 HOURS W/ 1HR
GLUCOSE, 1 HOUR: 88 mg/dL (ref 65–179)
GLUCOSE, FASTING: 68 mg/dL (ref 65–91)
Glucose, 2 hour: 56 mg/dL — ABNORMAL LOW (ref 65–152)

## 2017-11-10 LAB — CBC
HEMATOCRIT: 31 % — AB (ref 34.0–46.6)
Hemoglobin: 10 g/dL — ABNORMAL LOW (ref 11.1–15.9)
MCH: 29 pg (ref 26.6–33.0)
MCHC: 32.3 g/dL (ref 31.5–35.7)
MCV: 90 fL (ref 79–97)
Platelets: 180 10*3/uL (ref 150–379)
RBC: 3.45 x10E6/uL — ABNORMAL LOW (ref 3.77–5.28)
RDW: 13 % (ref 12.3–15.4)
WBC: 9.2 10*3/uL (ref 3.4–10.8)

## 2017-11-10 LAB — RPR: RPR Ser Ql: NONREACTIVE

## 2017-11-10 LAB — HIV ANTIBODY (ROUTINE TESTING W REFLEX): HIV Screen 4th Generation wRfx: NONREACTIVE

## 2017-11-23 ENCOUNTER — Encounter: Payer: Self-pay | Admitting: Obstetrics

## 2017-11-23 ENCOUNTER — Ambulatory Visit (INDEPENDENT_AMBULATORY_CARE_PROVIDER_SITE_OTHER): Payer: Medicaid Other | Admitting: Obstetrics

## 2017-11-23 ENCOUNTER — Other Ambulatory Visit: Payer: Self-pay

## 2017-11-23 VITALS — BP 120/76 | HR 95 | Wt 214.0 lb

## 2017-11-23 DIAGNOSIS — Z3480 Encounter for supervision of other normal pregnancy, unspecified trimester: Secondary | ICD-10-CM

## 2017-11-23 DIAGNOSIS — Z6791 Unspecified blood type, Rh negative: Secondary | ICD-10-CM | POA: Diagnosis not present

## 2017-11-23 DIAGNOSIS — O09893 Supervision of other high risk pregnancies, third trimester: Secondary | ICD-10-CM

## 2017-11-23 DIAGNOSIS — O26899 Other specified pregnancy related conditions, unspecified trimester: Secondary | ICD-10-CM

## 2017-11-23 MED ORDER — RHO D IMMUNE GLOBULIN 1500 UNIT/2ML IJ SOSY
300.0000 ug | PREFILLED_SYRINGE | Freq: Once | INTRAMUSCULAR | Status: AC
  Administered 2017-11-23: 300 ug via INTRAMUSCULAR

## 2017-11-23 NOTE — Progress Notes (Signed)
Subjective:  Krystal Murray is a 26 y.o. [phone] at 11077w1d being seen today for ongoing prenatal care.  She is currently monitored for the following issues for this low-risk pregnancy and has Supervision of other normal pregnancy, antepartum on their problem list.  Patient reports no complaints.  Contractions: Not present. Vag. Bleeding: None.  Movement: Present. Denies leaking of fluid.   The following portions of the patient's history were reviewed and updated as appropriate: allergies, current medications, past family history, past medical history, past social history, past surgical history and problem list. Problem list updated.  Objective:   Vitals:   11/23/17 0849  BP: 120/76  Pulse: 95  Weight: 214 lb (97.1 kg)    Fetal Status: Fetal Heart Rate (bpm): 150   Movement: Present     General:  Alert, oriented and cooperative. Patient is in no acute distress.  Skin: Skin is warm and dry. No rash noted.   Cardiovascular: Normal heart rate noted  Respiratory: Normal respiratory effort, no problems with respiration noted  Abdomen: Soft, gravid, appropriate for gestational age. Pain/Pressure: Absent     Pelvic:  Cervical exam deferred        Extremities: Normal range of motion.  Edema: Trace  Mental Status: Normal mood and affect. Normal behavior. Normal judgment and thought content.   Urinalysis:      Assessment and Plan:  Pregnancy: [id number] at 6877w1d  1. Supervision of other normal pregnancy, antepartum  2. Rh negative state in antepartum period - Rhogam today and postpartum  Preterm labor symptoms and general obstetric precautions including but not limited to vaginal bleeding, contractions, leaking of fluid and fetal movement were reviewed in detail with the patient. Please refer to After Visit Summary for other counseling recommendations.  Return in about 2 weeks (around 12/07/2017) for ROB.   Brock BadHarper, Charles A, MD

## 2017-11-23 NOTE — Progress Notes (Signed)
Rh Negative. Rhogam given in LUOQ, tolerated well.  Administrations This Visit    rho (d) immune globulin (RHIG/RHOPHYLAC) injection 300 mcg    Admin Date 11/23/2017 Action Given Dose 300 mcg Route Intramuscular Administered By Maretta BeesMcGlashan, Tyniesha Howald J, RMA

## 2017-12-07 ENCOUNTER — Ambulatory Visit (INDEPENDENT_AMBULATORY_CARE_PROVIDER_SITE_OTHER): Payer: Medicaid Other | Admitting: Obstetrics

## 2017-12-07 ENCOUNTER — Other Ambulatory Visit: Payer: Self-pay

## 2017-12-07 ENCOUNTER — Encounter: Payer: Self-pay | Admitting: Obstetrics

## 2017-12-07 VITALS — BP 115/71 | HR 92 | Wt 221.0 lb

## 2017-12-07 DIAGNOSIS — Z3480 Encounter for supervision of other normal pregnancy, unspecified trimester: Secondary | ICD-10-CM

## 2017-12-07 DIAGNOSIS — Z3483 Encounter for supervision of other normal pregnancy, third trimester: Secondary | ICD-10-CM

## 2017-12-07 NOTE — Progress Notes (Signed)
Subjective:  Krystal Murray is a 26 y.o. [phone] at 1428w1d being seen today for ongoing prenatal care.  She is currently monitored for the following issues for this low-risk pregnancy and has Supervision of other normal pregnancy, antepartum on their problem list.  Patient reports pelvic pressure.  Contractions: Not present. Vag. Bleeding: None.  Movement: Present. Denies leaking of fluid.   The following portions of the patient's history were reviewed and updated as appropriate: allergies, current medications, past family history, past medical history, past social history, past surgical history and problem list. Problem list updated.  Objective:   Vitals:   12/07/17 0817  BP: 115/71  Pulse: 92  Weight: 221 lb (100.2 kg)    Fetal Status: Fetal Heart Rate (bpm): 150   Movement: Present     General:  Alert, oriented and cooperative. Patient is in no acute distress.  Skin: Skin is warm and dry. No rash noted.   Cardiovascular: Normal heart rate noted  Respiratory: Normal respiratory effort, no problems with respiration noted  Abdomen: Soft, gravid, appropriate for gestational age. Pain/Pressure: Present     Pelvic:  Cervical exam deferred        Extremities: Normal range of motion.  Edema: None  Mental Status: Normal mood and affect. Normal behavior. Normal judgment and thought content.   Urinalysis:      Assessment and Plan:  Pregnancy: [id number] at 2028w1d  1. Supervision of other normal pregnancy, antepartum   Preterm labor symptoms and general obstetric precautions including but not limited to vaginal bleeding, contractions, leaking of fluid and fetal movement were reviewed in detail with the patient. Please refer to After Visit Summary for other counseling recommendations.  Return in about 2 weeks (around 12/21/2017) for ROB.   Brock BadHarper, Charles A, MD

## 2017-12-07 NOTE — Progress Notes (Signed)
Complains of pressure, [facility]

## 2017-12-09 ENCOUNTER — Inpatient Hospital Stay (HOSPITAL_COMMUNITY)
Admission: AD | Admit: 2017-12-09 | Discharge: 2017-12-09 | Disposition: A | Discharge status: Home / Self Care | Payer: Medicaid Other | Source: Ambulatory Visit | Attending: Obstetrics and Gynecology | Admitting: Obstetrics and Gynecology

## 2017-12-09 ENCOUNTER — Encounter (HOSPITAL_COMMUNITY): Payer: Self-pay | Admitting: *Deleted

## 2017-12-09 ENCOUNTER — Inpatient Hospital Stay (HOSPITAL_COMMUNITY): Payer: Medicaid Other

## 2017-12-09 DIAGNOSIS — R102 Pelvic and perineal pain: Secondary | ICD-10-CM

## 2017-12-09 DIAGNOSIS — O26893 Other specified pregnancy related conditions, third trimester: Secondary | ICD-10-CM | POA: Diagnosis not present

## 2017-12-09 DIAGNOSIS — O479 False labor, unspecified: Secondary | ICD-10-CM

## 2017-12-09 DIAGNOSIS — O4700 False labor before 37 completed weeks of gestation, unspecified trimester: Secondary | ICD-10-CM

## 2017-12-09 DIAGNOSIS — Z3A32 32 weeks gestation of pregnancy: Secondary | ICD-10-CM | POA: Diagnosis not present

## 2017-12-09 DIAGNOSIS — Z87891 Personal history of nicotine dependence: Secondary | ICD-10-CM | POA: Insufficient documentation

## 2017-12-09 LAB — URINALYSIS, ROUTINE W REFLEX MICROSCOPIC
Bilirubin Urine: NEGATIVE
GLUCOSE, UA: NEGATIVE mg/dL
Hgb urine dipstick: NEGATIVE
Ketones, ur: NEGATIVE mg/dL
Nitrite: NEGATIVE
PH: 5 (ref 5.0–8.0)
PROTEIN: 30 mg/dL — AB
Specific Gravity, Urine: 1.023 (ref 1.005–1.030)

## 2017-12-09 LAB — WET PREP, GENITAL
CLUE CELLS WET PREP: NONE SEEN
Sperm: NONE SEEN
Trich, Wet Prep: NONE SEEN
Yeast Wet Prep HPF POC: NONE SEEN

## 2017-12-09 LAB — FETAL FIBRONECTIN: Fetal Fibronectin: NEGATIVE

## 2017-12-09 NOTE — MAU Note (Signed)
Pt reports "pain like I am dilating" states it is in her vagina. Pt states pain was much worse last night but wanted to have it checked out. Also reports decreased fetal movement

## 2017-12-09 NOTE — Discharge Instructions (Signed)

## 2017-12-09 NOTE — MAU Provider Note (Cosign Needed)
History     CSN: [id number]  Arrival date and time: 12/09/17 0909   None     Chief Complaint  Patient presents with  . Vaginal Pain   26 yo [id number] at 5113w3d presents with pelvis pressure and pain that she rates as severe. Pain started last night and has improved minimally since that time. Patient says she feels like she is "dilating." She denies loss of fluid or vaginal bleeding. Patient has history of 2 previous vaginal births at term.     OB History    Gravida Para Term Preterm AB Living   5 2 2  0 2 2   SAB TAB Ectopic Multiple Live Births   1 1 0 0 2      Past Medical History:  Diagnosis Date  . Medical history non-contributory   . No pertinent past medical history   . UTI (lower urinary tract infection)     Past Surgical History:  Procedure Laterality Date  . NO PAST SURGERIES      Family History  Problem Relation Age of Onset  . Cancer Maternal Aunt        uterine  . Diabetes Maternal Aunt   . Hypertension Maternal Aunt   . Cancer Maternal Grandmother        breastt  . Cancer Cousin        leaukemia    Social History   Tobacco Use  . Smoking status: Former Smoker    Last attempt to quit: 05/2017    Years since quitting: 0.5  . Smokeless tobacco: Never Used  Substance Use Topics  . Alcohol use: No  . Drug use: No    Allergies: No Known Allergies  Medications Prior to Admission  Medication Sig Dispense Refill Last Dose  . cyclobenzaprine (FLEXERIL) 10 MG tablet Take 10 mg by mouth 3 (three) times daily as needed for muscle spasms.   Taking  . Elastic Bandages & Supports (COMFORT FIT MATERNITY SUPP SM) MISC 1 application daily by Does not apply route. 1 each 0 Taking  . metroNIDAZOLE (FLAGYL) 500 MG tablet Take 1 tablet (500 mg total) 2 (two) times daily by mouth. 14 tablet 2   . metroNIDAZOLE (FLAGYL) 500 MG tablet Take 1 tablet (500 mg total) 2 (two) times daily by mouth. 14 tablet 2 Taking  . omeprazole (PRILOSEC) 20 MG capsule Take 1  capsule (20 mg total) 2 (two) times daily before a meal by mouth. 60 capsule 5   . Prenat-FeCbn-FeAsp-Meth-FA-DHA (PRENATE MINI) 18-0.6-0.4-350 MG CAPS Take 1 capsule by mouth daily.   Taking    Review of Systems  Constitutional: Negative for activity change and appetite change.  HENT: Negative for congestion and dental problem.   Eyes: Negative for discharge and itching.  Respiratory: Negative for apnea and chest tightness.   Cardiovascular: Negative for chest pain and leg swelling.  Gastrointestinal: Negative for abdominal pain and nausea.  Endocrine: Negative for cold intolerance and heat intolerance.  Genitourinary: Positive for pelvic pain. Negative for difficulty urinating, dysuria and vaginal bleeding.  Neurological: Negative for dizziness and headaches.  Hematological: Negative for adenopathy. Does not bruise/bleed easily.   Physical Exam   unknown if currently breastfeeding.  Physical Exam  Constitutional: She is oriented to person, place, and time. She appears well-developed and well-nourished.  HENT:  Head: Normocephalic and atraumatic.  Eyes: Conjunctivae are normal. Pupils are equal, round, and reactive to light.  Neck: Normal range of motion. Neck supple.  Cardiovascular: Normal rate and intact  distal pulses.  Respiratory: Effort normal. No respiratory distress.  GI: Soft. There is no tenderness.  Genitourinary: Vagina normal.  Genitourinary Comments: Cervical exam: fingertip/thick/high  Musculoskeletal: Normal range of motion. She exhibits no edema.  Neurological: She is alert and oriented to person, place, and time.  Skin: Skin is warm and dry.  Psychiatric: She has a normal mood and affect. Her behavior is normal.    MAU Course  Procedures  EFM: 130 bpm/mod var/pos acels/ no decels Toco: uterine irritability with contractions q475min  MDM FFN collected. Will get US for cervical length. Wet prep pending.  Cervical length 4.6 cm and negative FFN. Discharge  home in stable condition.  Assessment and Plan  1. Pregnancy with 32 weeks completed gestation 2. Pelvic pain- not in preterm labor. May be MSK. Advised heat application and tylenol prn.   Chub[employer] Corporationmber Eliora Nienhuis 12/09/2017, 11:14 AM

## 2017-12-23 ENCOUNTER — Encounter: Payer: Self-pay | Admitting: Obstetrics

## 2017-12-23 ENCOUNTER — Ambulatory Visit (INDEPENDENT_AMBULATORY_CARE_PROVIDER_SITE_OTHER): Payer: Medicaid Other | Admitting: Obstetrics

## 2017-12-23 VITALS — BP 133/83 | HR 84 | Wt 221.6 lb

## 2017-12-23 DIAGNOSIS — Z6791 Unspecified blood type, Rh negative: Secondary | ICD-10-CM

## 2017-12-23 DIAGNOSIS — Z3480 Encounter for supervision of other normal pregnancy, unspecified trimester: Secondary | ICD-10-CM

## 2017-12-23 DIAGNOSIS — O09893 Supervision of other high risk pregnancies, third trimester: Secondary | ICD-10-CM

## 2017-12-23 DIAGNOSIS — O26899 Other specified pregnancy related conditions, unspecified trimester: Secondary | ICD-10-CM

## 2017-12-23 NOTE — Progress Notes (Signed)
Subjective:  Krystal Murray is a 27 y.o. [phone] at 7746w3d being seen today for ongoing prenatal care.  She is currently monitored for the following issues for this low-risk pregnancy and has Supervision of other normal pregnancy, antepartum on their problem list.  Patient reports no complaints.  Contractions: Not present. Vag. Bleeding: None.  Movement: Present. Denies leaking of fluid.   The following portions of the patient's history were reviewed and updated as appropriate: allergies, current medications, past family history, past medical history, past social history, past surgical history and problem list. Problem list updated.  Objective:   Vitals:   12/23/17 0809  BP: 133/83  Pulse: 84  Weight: 221 lb 9.6 oz (100.5 kg)    Fetal Status: Fetal Heart Rate (bpm): 150   Movement: Present     General:  Alert, oriented and cooperative. Patient is in no acute distress.  Skin: Skin is warm and dry. No rash noted.   Cardiovascular: Normal heart rate noted  Respiratory: Normal respiratory effort, no problems with respiration noted  Abdomen: Soft, gravid, appropriate for gestational age. Pain/Pressure: Present     Pelvic:  Cervical exam deferred        Extremities: Normal range of motion.  Edema: Trace  Mental Status: Normal mood and affect. Normal behavior. Normal judgment and thought content.   Urinalysis:      Assessment and Plan:  Pregnancy: [id number] at 7346w3d  1. Supervision of other normal pregnancy, antepartum  2. Rh negative state in antepartum period - Rhogam postpartum  Preterm labor symptoms and general obstetric precautions including but not limited to vaginal bleeding, contractions, leaking of fluid and fetal movement were reviewed in detail with the patient. Please refer to After Visit Summary for other counseling recommendations.  Return in about 1 week (around 12/30/2017) for ROB.   Brock BadHarper, Samanthan Dugo A, MD

## 2017-12-30 ENCOUNTER — Other Ambulatory Visit (HOSPITAL_COMMUNITY)
Admission: RE | Admit: 2017-12-30 | Discharge: 2017-12-30 | Disposition: A | Discharge status: Home / Self Care | Payer: Medicaid Other | Source: Ambulatory Visit | Attending: Obstetrics | Admitting: Obstetrics

## 2017-12-30 ENCOUNTER — Ambulatory Visit (INDEPENDENT_AMBULATORY_CARE_PROVIDER_SITE_OTHER): Payer: Medicaid Other | Admitting: Obstetrics

## 2017-12-30 ENCOUNTER — Other Ambulatory Visit: Payer: Self-pay

## 2017-12-30 ENCOUNTER — Encounter: Payer: Self-pay | Admitting: Obstetrics

## 2017-12-30 VITALS — BP 115/68 | HR 80 | Wt 223.0 lb

## 2017-12-30 DIAGNOSIS — Z3483 Encounter for supervision of other normal pregnancy, third trimester: Secondary | ICD-10-CM | POA: Insufficient documentation

## 2017-12-30 DIAGNOSIS — Z3480 Encounter for supervision of other normal pregnancy, unspecified trimester: Secondary | ICD-10-CM | POA: Diagnosis present

## 2017-12-30 DIAGNOSIS — Z6791 Unspecified blood type, Rh negative: Secondary | ICD-10-CM

## 2017-12-30 DIAGNOSIS — O26899 Other specified pregnancy related conditions, unspecified trimester: Secondary | ICD-10-CM

## 2017-12-30 NOTE — Progress Notes (Signed)
ROB/GBS. Only drinks 1 bottle of water each day and eats lots of ice. Takes PNV sometimes.

## 2017-12-30 NOTE — Progress Notes (Signed)
Subjective:  Krystal Murray is a 27 y.o. [phone] at [redacted]w[redacted]d being seen today for ongoing prenatal care.  She is currently monitored for the following issues for this low-risk pregnancy and has Supervision of other normal pregnancy, antepartum on their problem list.  Patient reports no complaints.  Contractions: Not present. Vag. Bleeding: None.  Movement: Present. Denies leaking of fluid.   The following portions of the patient's history were reviewed and updated as appropriate: allergies, current medications, past family history, past medical history, past social history, past surgical history and problem list. Problem list updated.  Objective:   Vitals:   12/30/17 0825  BP: 115/68  Pulse: 80  Weight: 223 lb (101.2 kg)    Fetal Status:     Movement: Present     General:  Alert, oriented and cooperative. Patient is in no acute distress.  Skin: Skin is warm and dry. No rash noted.   Cardiovascular: Normal heart rate noted  Respiratory: Normal respiratory effort, no problems with respiration noted  Abdomen: Soft, gravid, appropriate for gestational age. Pain/Pressure: Present     Pelvic:  Cervical exam deferred        Extremities: Normal range of motion.  Edema: Trace  Mental Status: Normal mood and affect. Normal behavior. Normal judgment and thought content.   Urinalysis:      Assessment and Plan:  Pregnancy: [id number] at 799w3d  1. Supervision of other normal pregnancy, antepartum Rx: - Strep Gp B NAA - Cervicovaginal ancillary only  2. Rh negative state in antepartum period - Rhogam postpartum  Term labor symptoms and general obstetric precautions including but not limited to vaginal bleeding, contractions, leaking of fluid and fetal movement were reviewed in detail with the patient. Please refer to After Visit Summary for other counseling recommendations.  Return in about 1 week (around 01/06/2018) for ROB.   Brock BadHarper, Stefen Juba A, MD

## 2017-12-31 LAB — CERVICOVAGINAL ANCILLARY ONLY
CHLAMYDIA, DNA PROBE: NEGATIVE
NEISSERIA GONORRHEA: NEGATIVE

## 2018-01-01 LAB — STREP GP B NAA: Strep Gp B NAA: POSITIVE — AB

## 2018-01-06 ENCOUNTER — Encounter: Payer: Medicaid Other | Admitting: Obstetrics

## 2018-01-17 ENCOUNTER — Encounter: Payer: Self-pay | Admitting: Obstetrics

## 2018-01-17 ENCOUNTER — Ambulatory Visit (INDEPENDENT_AMBULATORY_CARE_PROVIDER_SITE_OTHER): Payer: Medicaid Other | Admitting: Obstetrics

## 2018-01-17 VITALS — BP 124/75 | HR 87 | Wt 230.0 lb

## 2018-01-17 DIAGNOSIS — O09893 Supervision of other high risk pregnancies, third trimester: Secondary | ICD-10-CM

## 2018-01-17 DIAGNOSIS — Z6791 Unspecified blood type, Rh negative: Secondary | ICD-10-CM

## 2018-01-17 DIAGNOSIS — O26899 Other specified pregnancy related conditions, unspecified trimester: Secondary | ICD-10-CM

## 2018-01-17 DIAGNOSIS — Z3480 Encounter for supervision of other normal pregnancy, unspecified trimester: Secondary | ICD-10-CM

## 2018-01-17 DIAGNOSIS — O99820 Streptococcus B carrier state complicating pregnancy: Secondary | ICD-10-CM

## 2018-01-17 NOTE — Progress Notes (Signed)
Subjective:  Krystal Murray is a 27 y.o. [phone] at 4961w0d being seen today for ongoing prenatal care.  She is currently monitored for the following issues for this low-risk pregnancy and has Supervision of other normal pregnancy, antepartum on their problem list.  Patient reports no complaints.  Contractions: Irregular. Vag. Bleeding: None.  Movement: Present. Denies leaking of fluid.   The following portions of the patient's history were reviewed and updated as appropriate: allergies, current medications, past family history, past medical history, past social history, past surgical history and problem list. Problem list updated.  Objective:   Vitals:   01/17/18 1013  BP: 124/75  Pulse: 87  Weight: 230 lb (104.3 kg)    Fetal Status: Fetal Heart Rate (bpm): 150   Movement: Present     General:  Alert, oriented and cooperative. Patient is in no acute distress.  Skin: Skin is warm and dry. No rash noted.   Cardiovascular: Normal heart rate noted  Respiratory: Normal respiratory effort, no problems with respiration noted  Abdomen: Soft, gravid, appropriate for gestational age. Pain/Pressure: Present     Pelvic:  Cervical exam deferred        Extremities: Normal range of motion.  Edema: Trace  Mental Status: Normal mood and affect. Normal behavior. Normal judgment and thought content.   Urinalysis:      Assessment and Plan:  Pregnancy: [id number] at 7961w0d  1. Supervision of other normal pregnancy, antepartum - doing well  2. Rh negative state in antepartum period - Rhogam postpartum  3. GBS (group B Streptococcus carrier), +RV culture, currently pregnant - treat in labor  Term labor symptoms and general obstetric precautions including but not limited to vaginal bleeding, contractions, leaking of fluid and fetal movement were reviewed in detail with the patient. Please refer to After Visit Summary for other counseling recommendations.  Return in about 1 week (around 01/24/2018) for  ROB.   Brock BadHarper, Charles A, MD

## 2018-01-17 NOTE — Patient Instructions (Addendum)
Group B Streptococcus Infection During Pregnancy Group B Streptococcus (GBS) is a type of bacteria (Streptococcus agalactiae) that is often found in healthy people, commonly in the rectum, vagina, and intestines. In people who are healthy and not pregnant, the bacteria rarely cause serious illness or complications. However, women who test positive for GBS during pregnancy can pass the bacteria to their baby during childbirth, which can cause serious infection in the baby after birth. Women with GBS may also have infections during their pregnancy or immediately after childbirth, such as such as urinary tract infections (UTIs) or infections of the uterus (uterine infections). Having GBS also increases a woman's risk of complications during pregnancy, such as early (preterm) labor or delivery, miscarriage, or stillbirth. Routine testing (screening) for GBS is recommended for all pregnant women. What increases the risk? You may have a higher risk for GBS infection during pregnancy if you had one during a past pregnancy. What are the signs or symptoms? In most cases, GBS infection does not cause symptoms in pregnant women. Signs and symptoms of a possible GBS-related infection may include:  Labor starting before the 37th week of pregnancy.  A UTI or bladder infection, which may cause: ? Fever. ? Pain or burning during urination. ? Frequent urination.  Fever during labor, along with: ? Bad-smelling discharge. ? Uterine tenderness. ? Rapid heartbeat in the mother, baby, or both.  Rare but serious symptoms of a possible GBS-related infection in women include:  Blood infection (septicemia). This may cause fever, chills, or confusion.  Lung infection (pneumonia). This may cause fever, chills, cough, rapid breathing, difficulty breathing, or chest pain.  Bone, joint, skin, or soft tissue infection.  How is this diagnosed? You may be screened for GBS between week 35 and week 37 of your pregnancy. If  you have symptoms of preterm labor, you may be screened earlier. This condition is diagnosed based on lab test results from:  A swab of fluid from the vagina and rectum.  A urine sample.  How is this treated? This condition is treated with antibiotic medicine. When you go into labor, or as soon as your water breaks (your membranes rupture), you will be given antibiotics through an IV tube. Antibiotics will continue until after you give birth. If you are having a cesarean delivery, you do not need antibiotics unless your membranes have already ruptured. Follow these instructions at home:  Take over-the-counter and prescription medicines only as told by your health care provider.  Take your antibiotic medicine as told by your health care provider. Do not stop taking the antibiotic even if you start to feel better.  Keep all pre-birth (prenatal) visits and follow-up visits as told by your health care provider. This is important. Contact a health care provider if:  You have pain or burning when you urinate.  You have to urinate frequently.  You have a fever or chills.  You develop a bad-smelling vaginal discharge. Get help right away if:  Your membranes rupture.  You go into labor.  You have severe pain in your abdomen.  You have difficulty breathing.  You have chest pain. This information is not intended to replace advice given to you by your health care provider. Make sure you discuss any questions you have with your health care provider. Document Released: 03/15/2008 Document Revised: 07/03/2016 Document Reviewed: 07/02/2016 Elsevier Interactive Patient Education  2018 [employer]  

## 2018-01-24 ENCOUNTER — Encounter: Payer: Self-pay | Admitting: Obstetrics

## 2018-01-24 ENCOUNTER — Ambulatory Visit (INDEPENDENT_AMBULATORY_CARE_PROVIDER_SITE_OTHER): Payer: Medicaid Other | Admitting: Obstetrics

## 2018-01-24 ENCOUNTER — Telehealth (HOSPITAL_COMMUNITY): Payer: Self-pay | Admitting: *Deleted

## 2018-01-24 ENCOUNTER — Encounter (HOSPITAL_COMMUNITY): Payer: Self-pay | Admitting: *Deleted

## 2018-01-24 VITALS — BP 123/80 | HR 84 | Wt 229.8 lb

## 2018-01-24 DIAGNOSIS — Z3483 Encounter for supervision of other normal pregnancy, third trimester: Secondary | ICD-10-CM

## 2018-01-24 DIAGNOSIS — O99820 Streptococcus B carrier state complicating pregnancy: Secondary | ICD-10-CM

## 2018-01-24 DIAGNOSIS — Z6791 Unspecified blood type, Rh negative: Secondary | ICD-10-CM

## 2018-01-24 DIAGNOSIS — Z3480 Encounter for supervision of other normal pregnancy, unspecified trimester: Secondary | ICD-10-CM

## 2018-01-24 DIAGNOSIS — O26899 Other specified pregnancy related conditions, unspecified trimester: Secondary | ICD-10-CM

## 2018-01-24 NOTE — Telephone Encounter (Signed)
Preadmission screen  

## 2018-01-24 NOTE — Progress Notes (Signed)
Subjective:  Krystal Murray is a 27 y.o. [phone] at 1774w0d being seen today for ongoing prenatal care.  She is currently monitored for the following issues for this low-risk pregnancy and has Supervision of other normal pregnancy, antepartum on their problem list.  Patient reports occasional contractions.  Contractions: Irregular. Vag. Bleeding: None.  Movement: Present. Denies leaking of fluid.   The following portions of the patient's history were reviewed and updated as appropriate: allergies, current medications, past family history, past medical history, past social history, past surgical history and problem list. Problem list updated.  Objective:   Vitals:   01/24/18 1102  BP: 123/80  Pulse: 84  Weight: 229 lb 12.8 oz (104.2 kg)    Fetal Status: Fetal Heart Rate (bpm): 150   Movement: Present     General:  Alert, oriented and cooperative. Patient is in no acute distress.  Skin: Skin is warm and dry. No rash noted.   Cardiovascular: Normal heart rate noted  Respiratory: Normal respiratory effort, no problems with respiration noted  Abdomen: Soft, gravid, appropriate for gestational age. Pain/Pressure: Present     Pelvic:  Cervical exam deferred        Extremities: Normal range of motion.  Edema: Trace  Mental Status: Normal mood and affect. Normal behavior. Normal judgment and thought content.   Urinalysis:      Assessment and Plan:  Pregnancy: [id number] at 2274w0d  1. Supervision of other normal pregnancy, antepartum  2. Rh negative state in antepartum period  3. GBS (group B Streptococcus carrier), +RV culture, currently pregnant - treat in labor  Term labor symptoms and general obstetric precautions including but not limited to vaginal bleeding, contractions, leaking of fluid and fetal movement were reviewed in detail with the patient. Please refer to After Visit Summary for other counseling recommendations.  Return in about 1 week (around 01/31/2018) for  ROB.   Brock BadHarper, Charles A, MD

## 2018-01-24 NOTE — Progress Notes (Signed)
Patient reports good fetal movement with irregular contractions.  

## 2018-01-27 ENCOUNTER — Encounter (HOSPITAL_COMMUNITY): Payer: Self-pay

## 2018-01-27 ENCOUNTER — Inpatient Hospital Stay (HOSPITAL_COMMUNITY)
Admission: AD | Admit: 2018-01-27 | Discharge: 2018-01-27 | Disposition: A | Discharge status: Home / Self Care | Payer: Medicaid Other | Source: Ambulatory Visit | Attending: Obstetrics and Gynecology | Admitting: Obstetrics and Gynecology

## 2018-01-27 DIAGNOSIS — Z87440 Personal history of urinary (tract) infections: Secondary | ICD-10-CM | POA: Diagnosis not present

## 2018-01-27 DIAGNOSIS — R51 Headache: Secondary | ICD-10-CM | POA: Insufficient documentation

## 2018-01-27 DIAGNOSIS — Z87891 Personal history of nicotine dependence: Secondary | ICD-10-CM | POA: Insufficient documentation

## 2018-01-27 DIAGNOSIS — O471 False labor at or after 37 completed weeks of gestation: Secondary | ICD-10-CM | POA: Diagnosis not present

## 2018-01-27 DIAGNOSIS — R102 Pelvic and perineal pain unspecified side: Secondary | ICD-10-CM

## 2018-01-27 DIAGNOSIS — O479 False labor, unspecified: Secondary | ICD-10-CM

## 2018-01-27 DIAGNOSIS — O26893 Other specified pregnancy related conditions, third trimester: Secondary | ICD-10-CM | POA: Insufficient documentation

## 2018-01-27 DIAGNOSIS — O26899 Other specified pregnancy related conditions, unspecified trimester: Secondary | ICD-10-CM

## 2018-01-27 DIAGNOSIS — Z3A39 39 weeks gestation of pregnancy: Secondary | ICD-10-CM | POA: Diagnosis not present

## 2018-01-27 DIAGNOSIS — G44209 Tension-type headache, unspecified, not intractable: Secondary | ICD-10-CM

## 2018-01-27 DIAGNOSIS — R109 Unspecified abdominal pain: Secondary | ICD-10-CM | POA: Diagnosis present

## 2018-01-27 LAB — PROTEIN / CREATININE RATIO, URINE
CREATININE, URINE: 301 mg/dL
PROTEIN CREATININE RATIO: 0.14 mg/mg{creat} (ref 0.00–0.15)
Total Protein, Urine: 41 mg/dL

## 2018-01-27 LAB — COMPREHENSIVE METABOLIC PANEL
ALK PHOS: 74 U/L (ref 38–126)
ALT: 11 U/L — ABNORMAL LOW (ref 14–54)
ANION GAP: 9 (ref 5–15)
AST: 20 U/L (ref 15–41)
Albumin: 2.9 g/dL — ABNORMAL LOW (ref 3.5–5.0)
BUN: 11 mg/dL (ref 6–20)
CALCIUM: 8.4 mg/dL — AB (ref 8.9–10.3)
CO2: 17 mmol/L — AB (ref 22–32)
Chloride: 108 mmol/L (ref 101–111)
Creatinine, Ser: 0.57 mg/dL (ref 0.44–1.00)
GFR calc Af Amer: >60 mL/min (ref >60)
Glucose, Bld: 82 mg/dL (ref 65–99)
Potassium: 3.1 mmol/L — ABNORMAL LOW (ref 3.5–5.1)
SODIUM: 134 mmol/L — AB (ref 135–145)
Total Bilirubin: 0.8 mg/dL (ref 0.3–1.2)
Total Protein: 6.8 g/dL (ref 6.5–8.1)

## 2018-01-27 LAB — CBC
HCT: 25.3 % — ABNORMAL LOW (ref 36.0–46.0)
HEMOGLOBIN: 8.1 g/dL — AB (ref 12.0–15.0)
MCH: 26.4 pg (ref 26.0–34.0)
MCHC: 32 g/dL (ref 30.0–36.0)
MCV: 82.4 fL (ref 78.0–100.0)
Platelets: 158 10*3/uL (ref 150–400)
RBC: 3.07 MIL/uL — AB (ref 3.87–5.11)
RDW: 15 % (ref 11.5–15.5)
WBC: 7.8 10*3/uL (ref 4.0–10.5)

## 2018-01-27 NOTE — MAU Note (Signed)
Fern slide appears negative.

## 2018-01-27 NOTE — MAU Note (Signed)
Pt has been feeling leaking of fluid since yesterday. Having lower abdominal pain 6/10. Also having swelling in her feet x 2 days

## 2018-01-27 NOTE — MAU Provider Note (Signed)
History     CSN: [id number]  Arrival date and time: 01/27/18 1451   None     Chief Complaint  Patient presents with  . Rupture of Membranes  . Abdominal Pain   HPI  Krystal Murray is a 27 y.o. [id number] at [redacted]w[redacted]d who presents to the MAU with complaints of pelvic pressure/pain, leaking of fluid, and abdominal pain. patient states that her abdominal pain comes and goes and feels like tightening. She also has increased pelvic pressure and feels like the baby is in her "vagina". She denies any vaginal bleeding. Good fetal movement. Also complains of a headache that is 4/10. Has not taken any medications. She describes leaking of fluid that occurred yesterday. Has not had any continued leaking of fluid.   Past Medical History:  Diagnosis Date  . No pertinent past medical history   . UTI (lower urinary tract infection)     Past Surgical History:  Procedure Laterality Date  . NO PAST SURGERIES      Family History  Problem Relation Age of Onset  . Cancer Maternal Aunt        uterine  . Diabetes Maternal Aunt   . Hypertension Maternal Aunt   . Cancer Maternal Grandmother        breastt  . Cancer Cousin        leaukemia    Social History   Tobacco Use  . Smoking status: Former Smoker    Last attempt to quit: 05/2017    Years since quitting: 0.6  . Smokeless tobacco: Never Used  Substance Use Topics  . Alcohol use: No  . Drug use: No    Allergies: No Known Allergies  Medications Prior to Admission  Medication Sig Dispense Refill Last Dose  . omeprazole (PRILOSEC) 20 MG capsule Take 1 capsule (20 mg total) 2 (two) times daily before a meal by mouth. (Patient taking differently: Take 20 mg by mouth 2 (two) times daily as needed (heartburn). ) 60 capsule 5 prn  . Prenat-FeCbn-FeAsp-Meth-FA-DHA (PRENATE MINI) 18-0.6-0.4-350 MG CAPS Take 1 capsule by mouth daily.   Past Month at Unknown time  . Elastic Bandages & Supports (COMFORT FIT MATERNITY SUPP SM) MISC 1 application  daily by Does not apply route. (Patient not taking: Reported on 01/17/2018) 1 each 0 Not Taking    Review of Systems  All systems reviewed and are negative for acute change except as noted in the HPI.  Physical Exam   Blood pressure 110/68, pulse 87, temperature 98.4 F (36.9 C), temperature source Oral, resp. rate 16, weight 234 lb (106.1 kg), SpO2 98 %, unknown if currently breastfeeding.  Physical Exam  Constitutional: She appears well-developed and well-nourished. No distress.  HENT:  Head: Normocephalic and atraumatic.  Eyes: EOM are normal.  Cardiovascular: Normal rate and regular rhythm.  Respiratory: Effort normal.  GI: Soft. There is no tenderness. There is no guarding.  Gravid appropriate for GA  Musculoskeletal: Normal range of motion.  Neurological: She is alert.  Psychiatric: She has a normal mood and affect.   Dilation: Closed Effacement (%): Thick Station: -3 Presentation: Vertex Exam by:: Dr Doroteo Glassman    Results for orders placed or performed during the hospital encounter of 01/27/18  Protein / creatinine ratio, urine  Result Value Ref Range   Creatinine, Urine 301.00 mg/dL   Total Protein, Urine 41 mg/dL   Protein Creatinine Ratio 0.14 0.00 - 0.15 mg/mg[Cre]  Comprehensive metabolic panel  Result Value Ref Range   Sodium 134 (  L) 135 - 145 mmol/L   Potassium 3.1 (L) 3.5 - 5.1 mmol/L   Chloride 108 101 - 111 mmol/L   CO2 17 (L) 22 - 32 mmol/L   Glucose, Bld 82 65 - 99 mg/dL   BUN 11 6 - 20 mg/dL   Creatinine, Ser 1.610.57 0.44 - 1.00 mg/dL   Calcium 8.4 (L) 8.9 - 10.3 mg/dL   Total Protein 6.8 6.5 - 8.1 g/dL   Albumin 2.9 (L) 3.5 - 5.0 g/dL   AST 20 15 - 41 U/L   ALT 11 (L) 14 - 54 U/L   Alkaline Phosphatase 74 38 - 126 U/L   Total Bilirubin 0.8 0.3 - 1.2 mg/dL   GFR calc non Af Amer >60 >60 mL/min   GFR calc Af Amer >60 >60 mL/min   Anion gap 9 5 - 15  CBC  Result Value Ref Range   WBC 7.8 4.0 - 10.5 K/uL   RBC 3.07 (L) 3.87 - 5.11 MIL/uL    Hemoglobin 8.1 (L) 12.0 - 15.0 g/dL   HCT 09.625.3 (L) 04.536.0 - 40.946.0 %   MCV 82.4 78.0 - 100.0 fL   MCH 26.4 26.0 - 34.0 pg   MCHC 32.0 30.0 - 36.0 g/dL   RDW 81.115.0 91.411.5 - 78.215.5 %   Platelets 158 150 - 400 K/uL    MAU Course  Procedures  MDM Vitals and nursing note reviewed Patient declined any MAU treatment PIH labs drawn due to headache with upper limit of normal BP - labs normal  EFM: FHT 131, moderate variabililty, +accel, -decel. Toco with UI. Reactive NST  Assessment and Plan  Acute non intractable tension-type headache  Pelvic pressure in pregnancy  Braxton Hick's contraction  Term Pregnancy  Discharge home in stable condition Labor precautions reviewed Encouraged patient to try Tylenol for headache [facility]  Caryl AdaJazma Phelps, DO 01/27/2018, 4:31 PM

## 2018-01-27 NOTE — Discharge Instructions (Signed)

## 2018-02-01 ENCOUNTER — Inpatient Hospital Stay (HOSPITAL_COMMUNITY): Payer: Medicaid Other

## 2018-02-01 ENCOUNTER — Ambulatory Visit (INDEPENDENT_AMBULATORY_CARE_PROVIDER_SITE_OTHER): Payer: Medicaid Other | Admitting: Obstetrics

## 2018-02-01 ENCOUNTER — Inpatient Hospital Stay (HOSPITAL_COMMUNITY)
Admission: AD | Admit: 2018-02-01 | Discharge: 2018-02-01 | Disposition: A | Discharge status: Home / Self Care | Payer: Medicaid Other | Source: Ambulatory Visit | Attending: Obstetrics and Gynecology | Admitting: Obstetrics and Gynecology

## 2018-02-01 ENCOUNTER — Encounter (HOSPITAL_COMMUNITY): Payer: Self-pay | Admitting: *Deleted

## 2018-02-01 ENCOUNTER — Other Ambulatory Visit: Payer: Self-pay | Admitting: Advanced Practice Midwife

## 2018-02-01 ENCOUNTER — Encounter: Payer: Self-pay | Admitting: Obstetrics

## 2018-02-01 VITALS — BP 127/82 | HR 78 | Wt 232.0 lb

## 2018-02-01 DIAGNOSIS — Z0371 Encounter for suspected problem with amniotic cavity and membrane ruled out: Secondary | ICD-10-CM | POA: Diagnosis not present

## 2018-02-01 DIAGNOSIS — Z3A40 40 weeks gestation of pregnancy: Secondary | ICD-10-CM | POA: Insufficient documentation

## 2018-02-01 DIAGNOSIS — Z808 Family history of malignant neoplasm of other organs or systems: Secondary | ICD-10-CM | POA: Diagnosis not present

## 2018-02-01 DIAGNOSIS — Z803 Family history of malignant neoplasm of breast: Secondary | ICD-10-CM | POA: Diagnosis not present

## 2018-02-01 DIAGNOSIS — Z8249 Family history of ischemic heart disease and other diseases of the circulatory system: Secondary | ICD-10-CM | POA: Insufficient documentation

## 2018-02-01 DIAGNOSIS — Z3483 Encounter for supervision of other normal pregnancy, third trimester: Secondary | ICD-10-CM

## 2018-02-01 DIAGNOSIS — O480 Post-term pregnancy: Secondary | ICD-10-CM | POA: Diagnosis not present

## 2018-02-01 DIAGNOSIS — Z87891 Personal history of nicotine dependence: Secondary | ICD-10-CM | POA: Diagnosis not present

## 2018-02-01 DIAGNOSIS — Z833 Family history of diabetes mellitus: Secondary | ICD-10-CM | POA: Diagnosis not present

## 2018-02-01 DIAGNOSIS — Z806 Family history of leukemia: Secondary | ICD-10-CM | POA: Insufficient documentation

## 2018-02-01 DIAGNOSIS — Z79899 Other long term (current) drug therapy: Secondary | ICD-10-CM | POA: Insufficient documentation

## 2018-02-01 DIAGNOSIS — Z3480 Encounter for supervision of other normal pregnancy, unspecified trimester: Secondary | ICD-10-CM

## 2018-02-01 DIAGNOSIS — O368130 Decreased fetal movements, third trimester, not applicable or unspecified: Secondary | ICD-10-CM

## 2018-02-01 DIAGNOSIS — O368190 Decreased fetal movements, unspecified trimester, not applicable or unspecified: Secondary | ICD-10-CM

## 2018-02-01 LAB — AMNISURE RUPTURE OF MEMBRANE (ROM) NOT AT ARMC: Amnisure ROM: NEGATIVE

## 2018-02-01 NOTE — MAU Provider Note (Signed)
Chief Complaint:  Vaginal Discharge   First Provider Initiated Contact with Patient 02/01/18 1435      HPI: Krystal Murray is a 27 y.o. [id number] at 6040w1dwho presents to MAU being sent from the office for rule out rupture and AFI. She reports leaking of fluid on and off for the past month. She denies gush of fluid and reports that sometimes she has "something run down her leg". She denies recent IC. She also had complaints in the office of decreased fetal movement, currently feels baby move with no concerns. She denies vaginal bleeding, vaginal itching/burning, urinary symptoms, h/a, dizziness, n/v, or fever/chills.    Past Medical History: Past Medical History:  Diagnosis Date  . No pertinent past medical history   . UTI (lower urinary tract infection)     Past obstetric history: OB History  Gravida Para Term Preterm AB Living  5 2 2  0 2 2  SAB TAB Ectopic Multiple Live Births  1 1 0 0 2    # Outcome Date GA Lbr Len/2nd Weight Sex Delivery Anes PTL Lv  5 Current           4 SAB 2016 2885w0d         3 Term 09/02/12 3357w6d 239:07 / 00:05 6 lb 0.5 oz (2.735 kg) F Vag-Spont EPI  LIV  2 TAB 2012          1 Term 10/30/09 3833w0d 27:00 8 lb 5 oz (3.771 kg) F Vag-Spont EPI  LIV      Past Surgical History: Past Surgical History:  Procedure Laterality Date  . NO PAST SURGERIES      Family History: Family History  Problem Relation Age of Onset  . Cancer Maternal Aunt        uterine  . Diabetes Maternal Aunt   . Hypertension Maternal Aunt   . Cancer Maternal Grandmother        breastt  . Cancer Cousin        leaukemia    Social History: Social History   Tobacco Use  . Smoking status: Former Smoker    Last attempt to quit: 05/2017    Years since quitting: 0.7  . Smokeless tobacco: Never Used  Substance Use Topics  . Alcohol use: No  . Drug use: No    Allergies: No Known Allergies  Meds:  Medications Prior to Admission  Medication Sig Dispense Refill Last Dose  .  Prenat-FeCbn-FeAsp-Meth-FA-DHA (PRENATE MINI) 18-0.6-0.4-350 MG CAPS Take 1 capsule by mouth daily.   Past Month at Unknown time  . Elastic Bandages & Supports (COMFORT FIT MATERNITY SUPP SM) MISC 1 application daily by Does not apply route. (Patient not taking: Reported on 01/17/2018) 1 each 0 Not Taking at Unknown time  . omeprazole (PRILOSEC) 20 MG capsule Take 1 capsule (20 mg total) 2 (two) times daily before a meal by mouth. (Patient not taking: Reported on 02/01/2018) 60 capsule 5 Not Taking at Unknown time    ROS:  Review of Systems  Constitutional:       Decreased fetal movement  Respiratory: Negative.   Cardiovascular: Negative.   Gastrointestinal: Negative.   Genitourinary: Positive for vaginal discharge. Negative for difficulty urinating, dysuria, frequency, pelvic pain, urgency and vaginal bleeding.  Musculoskeletal: Negative.   Neurological: Negative.   Psychiatric/Behavioral: Negative.    I have reviewed patient's Past Medical Hx, Surgical Hx, Family Hx, Social Hx, medications and allergies.   Physical Exam   Patient Vitals for the past 24 hrs:  BP Temp Temp src Pulse Resp SpO2  02/01/18 1315 127/74 97.6 F (36.4 C) Oral 81 17 99 %   Constitutional: Well-developed, well-nourished female in no acute distress.  Cardiovascular: normal rate Respiratory: normal effort GI: Abd soft, non-tender, gravid appropriate for gestational age.  MS: Extremities nontender, no edema, normal ROM Neurologic: Alert and oriented x 4.  GU: Neg CVAT. PELVIC EXAM: declined by patient   FHT:  Baseline 135 , moderate variability, accelerations present, no decelerations Contractions: q 2-11 mins/ mild    Labs: Results for orders placed or performed during the hospital encounter of 02/01/18 (from the past 24 hour(s))  Amnisure rupture of membrane (rom)not at [facility]     Status: None   Collection Time: 02/01/18  2:49 PM  Result Value Ref Range   Amnisure ROM NEGATIVE    --/--/A NEG, A NEG  (08/30 2340)  Imaging:  [place] Mfm Ob Limited  Result Date: 02/01/2018 ----------------------------------------------------------------------  OBSTETRICS REPORT                      (Signed Final 02/01/2018 02:20 pm) ---------------------------------------------------------------------- Patient Info  ID #:       [id number]                          D.O.B.:  Dec 05, 1991 (26 yrs)  Name:       Krystal Murray               Visit Date: 02/01/2018 01:19 pm ---------------------------------------------------------------------- Performed By  Performed By:     Tommi Emery         Secondary Phy.:   Bing Neighbors                    RDMS                                                             HARPER MD  Attending:        Charlsie Merles MD         Address:          [address]                                                             [place], [place]                                                             [address]  Referred By:      MAU  Nursing-           Location:         [facility]                    MAU/Triage ---------------------------------------------------------------------- Orders   #  Description                                 Code   1  [place] MFM OB LIMITED                           [phone]  ----------------------------------------------------------------------   #  Ordered By               Order #        Accession #    Episode #   1  Coral Ceo           [id number]      [id number]     [id number]  ---------------------------------------------------------------------- Indications   [redacted] weeks gestation of pregnancy                Z3A.40   Postdate pregnancy (40-42 weeks)               O48.0   Decreased fetal movements, third trimester,    O36.8130   unspecified  ---------------------------------------------------------------------- OB History  Blood Type:            Height:  5'4"   Weight (lb):  184       BMI:  31.58  Gravidity:    5          Term:   2        Prem:   0        SAB:   1  TOP:          1       Ectopic:  0        Living: 2 ---------------------------------------------------------------------- Fetal Evaluation  Num Of Fetuses:     1  Fetal Heart         167  Rate(bpm):  Cardiac Activity:   Observed  Presentation:       Cephalic  Placenta:           Posterior, above cervical os  Amniotic Fluid  AFI FV:      Subjectively within normal limits  AFI Sum(cm)     %Tile       Largest Pocket(cm)  15.47           67          5.15  RUQ(cm)       RLQ(cm)       LUQ(cm)        LLQ(cm)  4.67          2.58          3.07           5.15 ---------------------------------------------------------------------- Gestational Age  Best:          40w 1d     Det. ByMarcella Dubs         EDD:   01/31/18                                      (06/08/17) ---------------------------------------------------------------------- Impression  SIUP at 40+1  weeks with decreased fetal movement; limited  US scan requested  Cephalic presentation  Normal amniotic fluid volume  Normal placentation ---------------------------------------------------------------------- Recommendations  Continue clinical evaluation and management. ----------------------------------------------------------------------                 Charlsie Merles, MD Electronically Signed Final Report   02/01/2018 02:20 pm ----------------------------------------------------------------------   MAU Course/MDM: Orders Placed This Encounter  Procedures  . [place] MFM OB Limited  . Amnisure rupture of membrane (rom)not at [facility]   Amnisure- negative  AFI- WNL NST reviewed- reactive  Pt discharge. Pt stable at time of discharge.   Assessment: 1. Encounter for suspected PROM, with rupture of membranes not found   2. Decreased fetal movements in third trimester, single or unspecified fetus     Plan: Discharge home Labor precautions and fetal kick counts IOL scheduled for 02/07/2018 Return to MAU as needed for  emergencies    Allergies as of 02/01/2018   No Known Allergies     Medication List    TAKE these medications   COMFORT FIT MATERNITY SUPP SM Misc 1 application daily by Does not apply route.   omeprazole 20 MG capsule Commonly known as:  PRILOSEC Take 1 capsule (20 mg total) 2 (two) times daily before a meal by mouth.   PRENATE MINI 18-0.6-0.4-350 MG Caps Take 1 capsule by mouth daily.       Steward Drone Certified Nurse-Midwife 02/01/2018 3:33 PM

## 2018-02-01 NOTE — Progress Notes (Signed)
Subjective:  Krystal Murray is a 27 y.o. [phone] at 7659w1d being seen today for ongoing prenatal care.  She is currently monitored for the following issues for this low-risk pregnancy and has Supervision of other normal pregnancy, antepartum on their problem list.  Patient reports leaking clear fluid off and on.  Contractions: Irregular. Vag. Bleeding: None.  Movement: (!) Decreased.  The following portions of the patient's history were reviewed and updated as appropriate: allergies, current medications, past family history, past medical history, past social history, past surgical history and problem list. Problem list updated.  Objective:   Vitals:   02/01/18 1021  BP: 127/82  Pulse: 78  Weight: 232 lb (105.2 kg)    Fetal Status: Fetal Heart Rate (bpm): NST - R   Movement: (!) Decreased     General:  Alert, oriented and cooperative. Patient is in no acute distress.  Skin: Skin is warm and dry. No rash noted.   Cardiovascular: Normal heart rate noted  Respiratory: Normal respiratory effort, no problems with respiration noted  Abdomen: Soft, gravid, appropriate for gestational age. Pain/Pressure: Present     Pelvic:  Cervical exam deferred        Extremities: Normal range of motion.  Edema: Trace  Mental Status: Normal mood and affect. Normal behavior. Normal judgment and thought content.   Urinalysis:      Assessment and Plan:  Pregnancy: [id number] at 5659w1d  1. Supervision of other normal pregnancy, antepartum   2. Decreased fetal movement during pregnancy, antepartum, single or unspecified fetus Rx: - Fetal nonstress test:  Reactive.  Baseline FHR 150's.  Good variability.  15x15 accels.  No decels.  - [facility]  Term labor symptoms and general obstetric precautions including but not limited to vaginal bleeding, contractions, leaking of fluid and fetal movement were reviewed in detail with the patient. Please refer to After Visit Summary for  other counseling recommendations.  Return for postpartum.   Brock BadHarper, Charles A, MD

## 2018-02-01 NOTE — MAU Note (Signed)
Sent from office for ultrasound

## 2018-02-03 ENCOUNTER — Other Ambulatory Visit: Payer: Self-pay | Admitting: Certified Nurse Midwife

## 2018-02-05 ENCOUNTER — Inpatient Hospital Stay (HOSPITAL_COMMUNITY)
Admission: AD | Admit: 2018-02-05 | Discharge: 2018-02-05 | Disposition: A | Discharge status: Home / Self Care | Payer: Medicaid Other | Source: Ambulatory Visit | Attending: Obstetrics and Gynecology | Admitting: Obstetrics and Gynecology

## 2018-02-05 ENCOUNTER — Encounter (HOSPITAL_COMMUNITY): Payer: Self-pay

## 2018-02-05 DIAGNOSIS — N898 Other specified noninflammatory disorders of vagina: Secondary | ICD-10-CM | POA: Diagnosis not present

## 2018-02-05 DIAGNOSIS — Z3A40 40 weeks gestation of pregnancy: Secondary | ICD-10-CM | POA: Diagnosis not present

## 2018-02-05 DIAGNOSIS — O9989 Other specified diseases and conditions complicating pregnancy, childbirth and the puerperium: Secondary | ICD-10-CM

## 2018-02-05 DIAGNOSIS — O471 False labor at or after 37 completed weeks of gestation: Secondary | ICD-10-CM | POA: Diagnosis not present

## 2018-02-05 DIAGNOSIS — O479 False labor, unspecified: Secondary | ICD-10-CM

## 2018-02-05 LAB — WET PREP, GENITAL
CLUE CELLS WET PREP: NONE SEEN
SPERM: NONE SEEN
Trich, Wet Prep: NONE SEEN
Yeast Wet Prep HPF POC: NONE SEEN

## 2018-02-05 MED ORDER — VALACYCLOVIR HCL 1 G PO TABS: 1000.0000 mg | ORAL_TABLET | Freq: Two times a day (BID) | ORAL | 0 refills | Status: DC

## 2018-02-05 NOTE — Discharge Instructions (Signed)
Herpes Simplex Test There are two common types of herpes simplex virus (HSV). These are classified as Type 1 (HSV1) or Type 2 (HSV2). Type 1 often causes cold sores on or around the mouth and sometimes on or around the eyes. Type 2 is commonly known as a sexually transmitted infection that causes sores in and around the genitals. Both types of herpes simplex can cause sores in different areas. There are two types of herpes simplex tests. These include:  Culture. This consists of collecting and testing a sample of fluid with a cotton swab from an open sore. This can only be done during an active infection (outbreak). Culture tests take several days to complete but are very accurate.  HSV blood tests. This test is not as accurate as a culture. However, HSV blood tests are faster than cultures, often providing a test result within one day. ? HSV antibody test. This checks for the presence of antibodies against HSV in your blood. Antibodies are proteins your body makes to help fight infection. ? HSV antigen test. This checks for the presence of the HSV germ (antigen) in your blood.  Your health care provider may recommend you have a HSV test if:  He or she believes you have a HSV infection.  You have a weakened immune system (immunocompromised) and you have sores around your mouth or genitals that look like HSV eruptions.  You have a fever of unknown origin (FUO).  You are pregnant, have herpes, and are expecting to deliver a baby vaginally in the next 6-8 weeks.  What do the results mean? It is your responsibility to obtain your test results. Ask the lab or department performing the test when and how you will get your results. Contact your health care provider to discuss any questions you have about your results. Range of Normal Values Ranges for normal values may vary among different labs and hospitals. You should always check with your health care provider after having lab work or other tests  done to discuss whether your values are considered within normal limits. Normal findings include:  No HSV antigen or antibodies present in your blood.  No HSV antigen present in cultured fluid.  Meaning of Results Outside Normal Ranges The following test results may indicate that you have an active HSV infection:  Positive culture for HSV1 or HSV2.  Presence of HSV1 or HSV2 antigens in your blood.  Presence of certain HSV1 or HSV2 antibodies (IgM) in your blood.  Discuss your test results with your health care provider. He or she will use the results to make a diagnosis and determine a treatment plan that is right for you. Talk with your health care provider to discuss your results, treatment options, and if necessary, the need for more tests. Talk with your health care provider if you have any questions about your results. This information is not intended to replace advice given to you by your health care provider. Make sure you discuss any questions you have with your health care provider. Document Released: 01/09/2005 Document Revised: 08/12/2016 Document Reviewed: 04/24/2014 Elsevier Interactive Patient Education  2018 [employer]. Cesarean Delivery Cesarean birth, or cesarean delivery, is the surgical delivery of a baby through an incision in the abdomen and the uterus. This may be referred to as a C-section. This procedure may be scheduled ahead of time, or it may be done in an emergency situation. Tell a health care provider about:  Any allergies you have.  All medicines you are  taking, including vitamins, herbs, eye drops, creams, and over-the-counter medicines.  Any problems you or family members have had with anesthetic medicines.  Any blood disorders you have.  Any surgeries you have had.  Any medical conditions you have.  Whether you or any members of your family have a history of deep vein thrombosis (DVT) or pulmonary embolism (PE). What are the risks? Generally,  this is a safe procedure. However, problems may occur, including:  Infection.  Bleeding.  Allergic reactions to medicines.  Damage to other structures or organs.  Blood clots.  Injury to your baby.  What happens before the procedure?  Follow instructions from your health care provider about eating or drinking restrictions.  Follow instructions from your health care provider about bathing before your procedure to help reduce your risk of infection.  If you know that you are going to have a cesarean delivery, do not shave your pubic area. Shaving before the procedure may increase your risk of infection.  Ask your health care provider about: ? Changing or stopping your regular medicines. This is especially important if you are taking diabetes medicines or blood thinners. ? Your pain management plan. This is especially important if you plan to breastfeed your baby. ? How long you will be in the hospital after the procedure. ? Any concerns you may have about receiving blood products if you need them during the procedure. ? Cord blood banking, if you plan to collect your babys umbilical cord blood.  You may also want to ask your health care provider: ? Whether you will be able to hold or breastfeed your baby while you are still in the operating room. ? Whether your baby can stay with you immediately after the procedure and during your recovery. ? Whether a family member or a person of your choice can go with you into the operating room and stay with you during the procedure, immediately after the procedure, and during your recovery.  Plan to have someone drive you home when you are discharged from the hospital. What happens during the procedure?  Fetal monitors will be placed on your abdomen to monitor your heart rate and your baby's heart rate.  Depending on the reason for your cesarean delivery, you may have a physical exam or additional testing, such as an ultrasound.  An IV  tube will be inserted into one of your veins.  You may have your blood or urine tested.  You will be given antibiotic medicine to help prevent infection.  You may be given a special warming gown to wear to keep your temperature stable.  Hair may be removed from your pubic area.  The skin of your pubic area and lower abdomen will be cleaned with a germ-killing solution (antiseptic).  A catheter may be inserted into your bladder through your urethra. This drains your urine during the procedure.  You may be given one or more of the following: ? A medicine to numb the area (local anesthetic). ? A medicine to make you fall asleep (general anesthetic). ? A medicine (regional anesthetic) that is injected into your back or through a small thin tube placed in your back (spinal anesthetic or epidural anesthetic). This numbs everything below the injection site and allows you to stay awake during your procedure. If this makes you feel nauseous, tell your health care provider. Medicines will be available to help reduce any nausea you may feel.  An incision will be made in your abdomen, and then in your  uterus.  If you are awake during your procedure, you may feel tugging and pulling in your abdomen, but you should not feel pain. If you feel pain, tell your health care provider immediately.  Your baby will be removed from your uterus. You may feel more pressure or pushing while this happens.  Immediately after birth, your baby will be dried and kept warm. You may be able to hold and breastfeed your baby. The umbilical cord may be clamped and cut during this time.  Your placenta will be removed from your uterus.  Your incisions will be closed with stitches (sutures). Staples, skin glue, or adhesive strips may also be applied to the incision in your abdomen.  Bandages (dressings) will be placed over the incision in your abdomen. The procedure may vary among health care providers and hospitals. What  happens after the procedure?  Your blood pressure, heart rate, breathing rate, and blood oxygen level will be monitored often until the medicines you were given have worn off.  You may continue to receive fluids and medicines through an IV tube.  You will have some pain. Medicines will be available to help control your pain.  To help prevent blood clots: ? You may be given medicines. ? You may have to wear compression stockings or devices. ? You will be encouraged to walk around when you are able.  Hospital staff will encourage and support bonding with your baby. Your hospital may allow you and your baby to stay in the same room (rooming in) during your hospital stay to encourage successful breastfeeding.  You may be encouraged to cough and breathe deeply often. This helps to prevent lung problems.  If you have a catheter draining your urine, it will be removed as soon as possible after your procedure. This information is not intended to replace advice given to you by your health care provider. Make sure you discuss any questions you have with your health care provider. Document Released: 12/07/2005 Document Revised: 05/14/2016 Document Reviewed: 09/17/2015 Elsevier Interactive Patient Education  [employer]2018 [employer]

## 2018-02-05 NOTE — MAU Note (Signed)
Lower abdominal pain- intermittent, sharp pains  Vaginal irritation- burning and itching since tuesday. Had switched soaps but has stopped using the new soap when symptoms started

## 2018-02-05 NOTE — MAU Note (Signed)
Urine in lab 

## 2018-02-05 NOTE — MAU Provider Note (Signed)
History     CSN: [id number]  Arrival date and time: 02/05/18 1259   First Provider Initiated Contact with Patient 02/05/18 1405      Chief Complaint  Patient presents with  . Abdominal Pain  . Vaginal Itching   HPI   Ms.Krystal Murray is a 27 y.o. female [phone] @ 655w5d here in MAU with contraction like pain and vaginal irritation. States she started having itching and burning around her vagina on Tuesday. States it hurts when the urine touches her skin. States today she was uncomfortable in her vaginal area and noticed white discharge. The contractions she is filling is irregular. No bleeding, + fetal movement.  No history of HSV, she is scheduled for induction of labor this Monday at 0700 am.   OB History    Gravida Para Term Preterm AB Living   5 2 2  0 2 2   SAB TAB Ectopic Multiple Live Births   1 1 0 0 2      Past Medical History:  Diagnosis Date  . No pertinent past medical history   . UTI (lower urinary tract infection)     Past Surgical History:  Procedure Laterality Date  . NO PAST SURGERIES      Family History  Problem Relation Age of Onset  . Cancer Maternal Aunt        uterine  . Diabetes Maternal Aunt   . Hypertension Maternal Aunt   . Cancer Maternal Grandmother        breastt  . Cancer Cousin        leaukemia    Social History   Tobacco Use  . Smoking status: Former Smoker    Last attempt to quit: 05/2017    Years since quitting: 0.7  . Smokeless tobacco: Never Used  Substance Use Topics  . Alcohol use: No  . Drug use: No    Allergies: No Known Allergies  Medications Prior to Admission  Medication Sig Dispense Refill Last Dose  . Prenatal Vit-Fe Fumarate-FA (PRENATAL MULTIVITAMIN) TABS tablet Take 1 tablet by mouth daily at 12 noon.   Past Month at Unknown time  . Elastic Bandages & Supports (COMFORT FIT MATERNITY SUPP SM) MISC 1 application daily by Does not apply route. (Patient not taking: Reported on 01/17/2018) 1 each 0 Not  Taking at Unknown time  . omeprazole (PRILOSEC) 20 MG capsule Take 1 capsule (20 mg total) 2 (two) times daily before a meal by mouth. (Patient not taking: Reported on 02/01/2018) 60 capsule 5 Not Taking at Unknown time    Results for orders placed or performed during the hospital encounter of 02/05/18 (from the past 48 hour(s))  Wet prep, genital     Status: Abnormal   Collection Time: 02/05/18  1:28 PM  Result Value Ref Range   Yeast Wet Prep HPF POC NONE SEEN NONE SEEN   Trich, Wet Prep NONE SEEN NONE SEEN   Clue Cells Wet Prep HPF POC NONE SEEN NONE SEEN   WBC, Wet Prep HPF POC MODERATE (A) NONE SEEN    Comment: BACTERIA- TOO NUMEROUS TO COUNT   Sperm NONE SEEN     Comment: Performed at [facility], [address] [place], [place] [address]   Review of Systems  Constitutional: Negative for fever.  Genitourinary: Positive for vaginal pain. Negative for difficulty urinating and dysuria.   Physical Exam   Blood pressure 134/76, pulse (!) 107, temperature 98.6 F (37 C), temperature source Oral, resp. rate 18, height  5\' 6"  (1.676 m), weight 234 lb 12 oz (106.5 kg), unknown if currently breastfeeding.  Physical Exam  Constitutional: She is oriented to person, place, and time. She appears well-developed and well-nourished. No distress.  HENT:  Head: Normocephalic.  Eyes: Pupils are equal, round, and reactive to light.  GI: Soft. She exhibits no distension. There is no tenderness. There is no rebound.  Genitourinary:    There is tenderness in the vagina.  Genitourinary Comments: Wet prep and GC collected by RN Dilation: 2 Effacement (%): Thick Station: -3 Presentation: Vertex Exam by:: n [email or web address] Multiple, pinpoint size ulcerative lesion noted near clitoris and upper vulva. Tender to touch.   Musculoskeletal: Normal range of motion.  Neurological: She is alert and oriented to person, place, and time.  Skin: Skin is warm. She is not diaphoretic.  Psychiatric: Her  behavior is normal.   Fetal Tracing: Baseline: 125 bpm Variability: Moderate  Accelerations: 15x15 Decelerations: None Toco: Occasional   MAU Course  Procedures  None  MDM  GC and Wet prep collected  HSV swab by PCR sent  Discussed with Dr. Jolayne Panther who came down to MAU to evaluate the patient.   Assessment and Plan   A:  1. Vaginal lesion   2. False labor   3. 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 Dr. Jolayne Panther discussed with patient Rx:  Valtrex. Start today Condoms always Labor precautions   Tye Vigo, Harolyn Rutherford, NP 02/05/2018 5:01 PM

## 2018-02-06 ENCOUNTER — Other Ambulatory Visit: Payer: Self-pay | Admitting: Obstetrics and Gynecology

## 2018-02-06 LAB — HERPES SIMPLEX VIRUS(HSV) DNA BY PCR
HSV 1 DNA: NEGATIVE
HSV 2 DNA: NEGATIVE

## 2018-02-07 ENCOUNTER — Inpatient Hospital Stay (HOSPITAL_COMMUNITY): Payer: Medicaid Other

## 2018-02-07 ENCOUNTER — Inpatient Hospital Stay (HOSPITAL_COMMUNITY)
Admission: AD | Admit: 2018-02-07 | Discharge: 2018-02-10 | DRG: 788 | Disposition: A | Discharge status: Home / Self Care | Payer: Medicaid Other | Source: Ambulatory Visit | Attending: Obstetrics and Gynecology | Admitting: Obstetrics and Gynecology

## 2018-02-07 ENCOUNTER — Inpatient Hospital Stay (HOSPITAL_COMMUNITY): Admission: RE | Admit: 2018-02-07 | Payer: Medicaid Other | Source: Ambulatory Visit

## 2018-02-07 ENCOUNTER — Encounter: Payer: Self-pay | Admitting: Obstetrics and Gynecology

## 2018-02-07 ENCOUNTER — Encounter (HOSPITAL_COMMUNITY)
Admission: AD | Disposition: A | Discharge status: Home / Self Care | Payer: Self-pay | Source: Ambulatory Visit | Attending: Obstetrics and Gynecology

## 2018-02-07 ENCOUNTER — Encounter (HOSPITAL_COMMUNITY): Payer: Self-pay | Admitting: *Deleted

## 2018-02-07 DIAGNOSIS — O99824 Streptococcus B carrier state complicating childbirth: Secondary | ICD-10-CM | POA: Diagnosis present

## 2018-02-07 DIAGNOSIS — Z98891 History of uterine scar from previous surgery: Secondary | ICD-10-CM

## 2018-02-07 DIAGNOSIS — O99214 Obesity complicating childbirth: Secondary | ICD-10-CM | POA: Diagnosis present

## 2018-02-07 DIAGNOSIS — A6000 Herpesviral infection of urogenital system, unspecified: Secondary | ICD-10-CM | POA: Diagnosis present

## 2018-02-07 DIAGNOSIS — Z3480 Encounter for supervision of other normal pregnancy, unspecified trimester: Secondary | ICD-10-CM

## 2018-02-07 DIAGNOSIS — Z3A41 41 weeks gestation of pregnancy: Secondary | ICD-10-CM

## 2018-02-07 DIAGNOSIS — E669 Obesity, unspecified: Secondary | ICD-10-CM | POA: Diagnosis present

## 2018-02-07 DIAGNOSIS — D649 Anemia, unspecified: Secondary | ICD-10-CM | POA: Diagnosis present

## 2018-02-07 DIAGNOSIS — O9902 Anemia complicating childbirth: Secondary | ICD-10-CM | POA: Diagnosis present

## 2018-02-07 DIAGNOSIS — Z87891 Personal history of nicotine dependence: Secondary | ICD-10-CM

## 2018-02-07 DIAGNOSIS — O9832 Other infections with a predominantly sexual mode of transmission complicating childbirth: Secondary | ICD-10-CM | POA: Diagnosis present

## 2018-02-07 DIAGNOSIS — B009 Herpesviral infection, unspecified: Secondary | ICD-10-CM | POA: Diagnosis present

## 2018-02-07 DIAGNOSIS — O480 Post-term pregnancy: Secondary | ICD-10-CM | POA: Diagnosis not present

## 2018-02-07 DIAGNOSIS — A6009 Herpesviral infection of other urogenital tract: Secondary | ICD-10-CM | POA: Diagnosis not present

## 2018-02-07 HISTORY — DX: History of uterine scar from previous surgery: Z98.891

## 2018-02-07 LAB — CBC
HCT: 26.4 % — ABNORMAL LOW (ref 36.0–46.0)
HCT: 20 % — ABNORMAL LOW (ref 36.0–46.0)
HCT: 21 % — ABNORMAL LOW (ref 36.0–46.0)
HEMOGLOBIN: 6.5 g/dL — AB (ref 12.0–15.0)
Hemoglobin: 8.6 g/dL — ABNORMAL LOW (ref 12.0–15.0)
Hemoglobin: 6.8 g/dL — CL (ref 12.0–15.0)
MCH: 26.8 pg (ref 26.0–34.0)
MCH: 26.7 pg (ref 26.0–34.0)
MCH: 26.8 pg (ref 26.0–34.0)
MCHC: 32.6 g/dL (ref 30.0–36.0)
MCHC: 32.5 g/dL (ref 30.0–36.0)
MCHC: 32.4 g/dL (ref 30.0–36.0)
MCV: 82.2 fL (ref 78.0–100.0)
MCV: 82.3 fL (ref 78.0–100.0)
MCV: 82.7 fL (ref 78.0–100.0)
PLATELETS: 182 10*3/uL (ref 150–400)
PLATELETS: 130 10*3/uL — AB (ref 150–400)
PLATELETS: 129 10*3/uL — AB (ref 150–400)
RBC: 3.21 MIL/uL — ABNORMAL LOW (ref 3.87–5.11)
RBC: 2.43 MIL/uL — AB (ref 3.87–5.11)
RBC: 2.54 MIL/uL — AB (ref 3.87–5.11)
RDW: 15 % (ref 11.5–15.5)
RDW: 15.1 % (ref 11.5–15.5)
RDW: 15 % (ref 11.5–15.5)
WBC: 10.5 10*3/uL (ref 4.0–10.5)
WBC: 11.6 10*3/uL — ABNORMAL HIGH (ref 4.0–10.5)
WBC: 15.5 10*3/uL — AB (ref 4.0–10.5)

## 2018-02-07 LAB — GC/CHLAMYDIA PROBE AMP (~~LOC~~) NOT AT ARMC
CHLAMYDIA, DNA PROBE: NEGATIVE
Neisseria Gonorrhea: NEGATIVE

## 2018-02-07 LAB — CREATININE, SERUM: Creatinine, Ser: 0.59 mg/dL (ref 0.44–1.00)

## 2018-02-07 LAB — PREPARE RBC (CROSSMATCH)

## 2018-02-07 SURGERY — Surgical Case
Anesthesia: Spinal | Site: Abdomen | Wound class: Clean Contaminated

## 2018-02-07 MED ORDER — ACETAMINOPHEN 325 MG PO TABS
650.0000 mg | ORAL_TABLET | ORAL | Status: DC | PRN
  Administered 2018-02-09: 650 mg via ORAL
  Filled 2018-02-07: qty 2

## 2018-02-07 MED ORDER — WITCH HAZEL-GLYCERIN EX PADS: 1.0000 "application " | MEDICATED_PAD | CUTANEOUS | Status: DC | PRN

## 2018-02-07 MED ORDER — MEPERIDINE HCL 25 MG/ML IJ SOLN: 6.2500 mg | INTRAMUSCULAR | Status: DC | PRN

## 2018-02-07 MED ORDER — KETOROLAC TROMETHAMINE 60 MG/2ML IM SOLN
30.0000 mg | Freq: Four times a day (QID) | INTRAMUSCULAR | Status: DC
  Filled 2018-02-07 (×4): qty 2

## 2018-02-07 MED ORDER — SIMETHICONE 80 MG PO CHEW
80.0000 mg | CHEWABLE_TABLET | ORAL | Status: DC
  Administered 2018-02-08 – 2018-02-09 (×3): 80 mg via ORAL
  Filled 2018-02-07 (×3): qty 1

## 2018-02-07 MED ORDER — MORPHINE SULFATE (PF) 0.5 MG/ML IJ SOLN
INTRAMUSCULAR | Status: AC
  Filled 2018-02-07: qty 10

## 2018-02-07 MED ORDER — DEXAMETHASONE SODIUM PHOSPHATE 4 MG/ML IJ SOLN
INTRAMUSCULAR | Status: AC
  Filled 2018-02-07: qty 1

## 2018-02-07 MED ORDER — SCOPOLAMINE 1 MG/3DAYS TD PT72
MEDICATED_PATCH | TRANSDERMAL | Status: AC
  Filled 2018-02-07: qty 1

## 2018-02-07 MED ORDER — ONDANSETRON HCL 4 MG/2ML IJ SOLN: 4.0000 mg | Freq: Three times a day (TID) | INTRAMUSCULAR | Status: DC | PRN

## 2018-02-07 MED ORDER — SCOPOLAMINE 1 MG/3DAYS TD PT72
1.0000 | MEDICATED_PATCH | Freq: Once | TRANSDERMAL | Status: DC
  Administered 2018-02-07: 1.5 mg via TRANSDERMAL

## 2018-02-07 MED ORDER — LACTATED RINGERS IV SOLN
INTRAVENOUS | Status: DC
  Administered 2018-02-07: 20:00:00 via INTRAVENOUS

## 2018-02-07 MED ORDER — PHENYLEPHRINE HCL 10 MG/ML IJ SOLN
INTRAMUSCULAR | Status: DC | PRN
  Administered 2018-02-07: 80 ug via INTRAVENOUS
  Administered 2018-02-07: 120 ug via INTRAVENOUS
  Administered 2018-02-07 (×2): 80 ug via INTRAVENOUS

## 2018-02-07 MED ORDER — PHENYLEPHRINE 40 MCG/ML (10ML) SYRINGE FOR IV PUSH (FOR BLOOD PRESSURE SUPPORT)
PREFILLED_SYRINGE | INTRAVENOUS | Status: AC
  Filled 2018-02-07: qty 10

## 2018-02-07 MED ORDER — MENTHOL 3 MG MT LOZG: 1.0000 | LOZENGE | OROMUCOSAL | Status: DC | PRN

## 2018-02-07 MED ORDER — SOD CITRATE-CITRIC ACID 500-334 MG/5ML PO SOLN
ORAL | Status: AC
  Filled 2018-02-07: qty 15

## 2018-02-07 MED ORDER — IBUPROFEN 800 MG PO TABS: 800.0000 mg | ORAL_TABLET | Freq: Three times a day (TID) | ORAL | Status: DC

## 2018-02-07 MED ORDER — SENNOSIDES-DOCUSATE SODIUM 8.6-50 MG PO TABS
2.0000 | ORAL_TABLET | ORAL | Status: DC
  Administered 2018-02-08 – 2018-02-09 (×3): 2 via ORAL
  Filled 2018-02-07 (×3): qty 2

## 2018-02-07 MED ORDER — DIPHENHYDRAMINE HCL 25 MG PO CAPS: 25.0000 mg | ORAL_CAPSULE | Freq: Four times a day (QID) | ORAL | Status: DC | PRN

## 2018-02-07 MED ORDER — ALBUMIN HUMAN 5 % IV SOLN
INTRAVENOUS | Status: DC | PRN
  Administered 2018-02-07 (×3): via INTRAVENOUS

## 2018-02-07 MED ORDER — FERROUS SULFATE 325 (65 FE) MG PO TABS
325.0000 mg | ORAL_TABLET | Freq: Two times a day (BID) | ORAL | Status: DC
  Administered 2018-02-07 – 2018-02-10 (×6): 325 mg via ORAL
  Filled 2018-02-07 (×6): qty 1

## 2018-02-07 MED ORDER — KETOROLAC TROMETHAMINE 30 MG/ML IJ SOLN: 30.0000 mg | Freq: Four times a day (QID) | INTRAMUSCULAR | Status: DC | PRN

## 2018-02-07 MED ORDER — CEFAZOLIN SODIUM-DEXTROSE 2-3 GM-%(50ML) IV SOLR
INTRAVENOUS | Status: DC | PRN
  Administered 2018-02-07: 2 g via INTRAVENOUS

## 2018-02-07 MED ORDER — METOCLOPRAMIDE HCL 5 MG/ML IJ SOLN: 10.0000 mg | Freq: Once | INTRAMUSCULAR | Status: DC | PRN

## 2018-02-07 MED ORDER — FENTANYL CITRATE (PF) 100 MCG/2ML IJ SOLN
INTRAMUSCULAR | Status: AC
  Filled 2018-02-07: qty 2

## 2018-02-07 MED ORDER — OXYTOCIN 10 UNIT/ML IJ SOLN
INTRAMUSCULAR | Status: AC
  Filled 2018-02-07: qty 4

## 2018-02-07 MED ORDER — NALBUPHINE HCL 10 MG/ML IJ SOLN: 5.0000 mg | INTRAMUSCULAR | Status: DC | PRN

## 2018-02-07 MED ORDER — FENTANYL CITRATE (PF) 100 MCG/2ML IJ SOLN
25.0000 ug | INTRAMUSCULAR | Status: DC | PRN
  Administered 2018-02-07 (×2): 25 ug via INTRAVENOUS

## 2018-02-07 MED ORDER — ZOLPIDEM TARTRATE 5 MG PO TABS: 5.0000 mg | ORAL_TABLET | Freq: Every evening | ORAL | Status: DC | PRN

## 2018-02-07 MED ORDER — LACTATED RINGERS IV SOLN
INTRAVENOUS | Status: DC | PRN
  Administered 2018-02-07: 12:00:00 via INTRAVENOUS

## 2018-02-07 MED ORDER — OXYTOCIN 10 UNIT/ML IJ SOLN
INTRAMUSCULAR | Status: DC | PRN
  Administered 2018-02-07: 40 [IU] via INTRAVENOUS

## 2018-02-07 MED ORDER — ONDANSETRON HCL 4 MG/2ML IJ SOLN
INTRAMUSCULAR | Status: DC | PRN
  Administered 2018-02-07: 4 mg via INTRAVENOUS

## 2018-02-07 MED ORDER — SODIUM CHLORIDE 0.9 % IR SOLN
Status: DC | PRN
  Administered 2018-02-07: 1

## 2018-02-07 MED ORDER — DIPHENHYDRAMINE HCL 25 MG PO CAPS: 25.0000 mg | ORAL_CAPSULE | ORAL | Status: DC | PRN

## 2018-02-07 MED ORDER — FENTANYL CITRATE (PF) 100 MCG/2ML IJ SOLN
INTRAMUSCULAR | Status: AC
  Administered 2018-02-07: 25 ug via INTRAVENOUS
  Filled 2018-02-07: qty 2

## 2018-02-07 MED ORDER — SODIUM CHLORIDE 0.9% FLUSH
3.0000 mL | INTRAVENOUS | Status: DC | PRN
  Administered 2018-02-08: 3 mL via INTRAVENOUS
  Filled 2018-02-07: qty 3

## 2018-02-07 MED ORDER — OXYTOCIN 40 UNITS IN LACTATED RINGERS INFUSION - SIMPLE MED: 2.5000 [IU]/h | INTRAVENOUS | Status: AC

## 2018-02-07 MED ORDER — SOD CITRATE-CITRIC ACID 500-334 MG/5ML PO SOLN
30.0000 mL | Freq: Once | ORAL | Status: AC
  Administered 2018-02-07: 30 mL via ORAL

## 2018-02-07 MED ORDER — PHENYLEPHRINE 8 MG IN D5W 100 ML (0.08MG/ML) PREMIX OPTIME
INJECTION | INTRAVENOUS | Status: DC | PRN
  Administered 2018-02-07: 60 ug/min via INTRAVENOUS

## 2018-02-07 MED ORDER — BUPIVACAINE IN DEXTROSE 0.75-8.25 % IT SOLN
INTRATHECAL | Status: DC | PRN
  Administered 2018-02-07: 12 mg via INTRATHECAL

## 2018-02-07 MED ORDER — DIPHENHYDRAMINE HCL 50 MG/ML IJ SOLN: 12.5000 mg | INTRAMUSCULAR | Status: DC | PRN

## 2018-02-07 MED ORDER — LACTATED RINGERS IV SOLN
INTRAVENOUS | Status: DC
  Administered 2018-02-07 (×3): via INTRAVENOUS

## 2018-02-07 MED ORDER — FENTANYL CITRATE (PF) 100 MCG/2ML IJ SOLN
INTRAMUSCULAR | Status: DC | PRN
  Administered 2018-02-07: 20 ug via INTRATHECAL

## 2018-02-07 MED ORDER — CEFAZOLIN SODIUM-DEXTROSE 2-4 GM/100ML-% IV SOLN
2.0000 g | INTRAVENOUS | Status: DC
  Filled 2018-02-07: qty 100

## 2018-02-07 MED ORDER — ALBUMIN HUMAN 5 % IV SOLN
INTRAVENOUS | Status: AC
  Filled 2018-02-07: qty 250

## 2018-02-07 MED ORDER — NALBUPHINE HCL 10 MG/ML IJ SOLN: 5.0000 mg | Freq: Once | INTRAMUSCULAR | Status: DC | PRN

## 2018-02-07 MED ORDER — SIMETHICONE 80 MG PO CHEW: 80.0000 mg | CHEWABLE_TABLET | ORAL | Status: DC | PRN

## 2018-02-07 MED ORDER — NALOXONE HCL 0.4 MG/ML IJ SOLN: 0.4000 mg | INTRAMUSCULAR | Status: DC | PRN

## 2018-02-07 MED ORDER — SIMETHICONE 80 MG PO CHEW
80.0000 mg | CHEWABLE_TABLET | Freq: Three times a day (TID) | ORAL | Status: DC
  Administered 2018-02-07 – 2018-02-10 (×8): 80 mg via ORAL
  Filled 2018-02-07 (×8): qty 1

## 2018-02-07 MED ORDER — NALOXONE HCL 4 MG/10ML IJ SOLN: 1.0000 ug/kg/h | INTRAVENOUS | Status: DC | PRN

## 2018-02-07 MED ORDER — KETOROLAC TROMETHAMINE 30 MG/ML IJ SOLN
30.0000 mg | Freq: Four times a day (QID) | INTRAMUSCULAR | Status: DC
  Administered 2018-02-07 (×2): 30 mg via INTRAMUSCULAR
  Filled 2018-02-07 (×2): qty 1

## 2018-02-07 MED ORDER — PRENATAL MULTIVITAMIN CH
1.0000 | ORAL_TABLET | Freq: Every day | ORAL | Status: DC
  Administered 2018-02-08 – 2018-02-09 (×2): 1 via ORAL
  Filled 2018-02-07 (×2): qty 1

## 2018-02-07 MED ORDER — DIBUCAINE 1 % RE OINT: 1.0000 "application " | TOPICAL_OINTMENT | RECTAL | Status: DC | PRN

## 2018-02-07 MED ORDER — BUPIVACAINE IN DEXTROSE 0.75-8.25 % IT SOLN
INTRATHECAL | Status: AC
  Filled 2018-02-07: qty 2

## 2018-02-07 MED ORDER — ENOXAPARIN SODIUM 60 MG/0.6ML ~~LOC~~ SOLN
50.0000 mg | SUBCUTANEOUS | Status: DC
  Administered 2018-02-08 – 2018-02-10 (×3): 50 mg via SUBCUTANEOUS
  Filled 2018-02-07 (×3): qty 0.6

## 2018-02-07 MED ORDER — MORPHINE SULFATE (PF) 0.5 MG/ML IJ SOLN
INTRAMUSCULAR | Status: DC | PRN
  Administered 2018-02-07: .2 mg via INTRATHECAL

## 2018-02-07 MED ORDER — ONDANSETRON HCL 4 MG/2ML IJ SOLN
INTRAMUSCULAR | Status: AC
  Filled 2018-02-07: qty 2

## 2018-02-07 MED ORDER — COCONUT OIL OIL: 1.0000 "application " | TOPICAL_OIL | Status: DC | PRN

## 2018-02-07 MED ORDER — DEXAMETHASONE SODIUM PHOSPHATE 4 MG/ML IJ SOLN
INTRAMUSCULAR | Status: DC | PRN
  Administered 2018-02-07: 4 mg via INTRAVENOUS

## 2018-02-07 MED ORDER — TETANUS-DIPHTH-ACELL PERTUSSIS 5-2.5-18.5 LF-MCG/0.5 IM SUSP: 0.5000 mL | Freq: Once | INTRAMUSCULAR | Status: DC

## 2018-02-07 SURGICAL SUPPLY — 42 items
BENZOIN TINCTURE PRP APPL 2/3 (GAUZE/BANDAGES/DRESSINGS) ×3 IMPLANT
CHLORAPREP W/TINT 26ML (MISCELLANEOUS) ×3 IMPLANT
CLAMP CORD UMBIL (MISCELLANEOUS) IMPLANT
CLOSURE STERI STRIP 1/2 X4 (GAUZE/BANDAGES/DRESSINGS) ×2 IMPLANT
CLOSURE WOUND 1/2 X4 (GAUZE/BANDAGES/DRESSINGS) ×1
CLOTH BEACON ORANGE TIMEOUT ST (SAFETY) ×3 IMPLANT
DRAPE C SECTION CLR SCREEN (DRAPES) IMPLANT
DRSG OPSITE POSTOP 4X10 (GAUZE/BANDAGES/DRESSINGS) ×3 IMPLANT
ELECT REM PT RETURN 9FT ADLT (ELECTROSURGICAL) ×3
ELECTRODE REM PT RTRN 9FT ADLT (ELECTROSURGICAL) ×1 IMPLANT
EXTRACTOR VACUUM M CUP 4 TUBE (SUCTIONS) IMPLANT
EXTRACTOR VACUUM M CUP 4' TUBE (SUCTIONS)
GLOVE BIO SURGEON STRL SZ7.5 (GLOVE) ×3 IMPLANT
GLOVE BIOGEL PI IND STRL 7.0 (GLOVE) ×1 IMPLANT
GLOVE BIOGEL PI INDICATOR 7.0 (GLOVE) ×2
GOWN STRL REUS W/TWL 2XL LVL3 (GOWN DISPOSABLE) ×3 IMPLANT
GOWN STRL REUS W/TWL LRG LVL3 (GOWN DISPOSABLE) ×6 IMPLANT
HOVERMATT SINGLE USE (MISCELLANEOUS) ×3 IMPLANT
KIT ABG SYR 3ML LUER SLIP (SYRINGE) IMPLANT
NEEDLE HYPO 22GX1.5 SAFETY (NEEDLE) ×3 IMPLANT
NEEDLE HYPO 25X5/8 SAFETYGLIDE (NEEDLE) IMPLANT
NS IRRIG 1000ML POUR BTL (IV SOLUTION) ×3 IMPLANT
PACK C SECTION WH (CUSTOM PROCEDURE TRAY) ×3 IMPLANT
PAD OB MATERNITY 4.3X12.25 (PERSONAL CARE ITEMS) ×3 IMPLANT
PENCIL SMOKE EVAC W/HOLSTER (ELECTROSURGICAL) ×3 IMPLANT
RTRCTR C-SECT PINK 25CM LRG (MISCELLANEOUS) ×3 IMPLANT
SPONGE LAP 18X18 X RAY DECT (DISPOSABLE) ×9 IMPLANT
STRIP CLOSURE SKIN 1/2X4 (GAUZE/BANDAGES/DRESSINGS) ×2 IMPLANT
SUT CHROMIC 1 CTX 36 (SUTURE) ×15 IMPLANT
SUT VIC AB 1 CT1 27 (SUTURE) ×4
SUT VIC AB 1 CT1 27XBRD ANTBC (SUTURE) ×2 IMPLANT
SUT VIC AB 2-0 CT1 (SUTURE) ×3 IMPLANT
SUT VIC AB 2-0 CT1 27 (SUTURE) ×2
SUT VIC AB 2-0 CT1 TAPERPNT 27 (SUTURE) ×1 IMPLANT
SUT VIC AB 3-0 CT1 27 (SUTURE) ×4
SUT VIC AB 3-0 CT1 TAPERPNT 27 (SUTURE) ×2 IMPLANT
SUT VIC AB 3-0 SH 27 (SUTURE)
SUT VIC AB 3-0 SH 27X BRD (SUTURE) IMPLANT
SUT VIC AB 4-0 KS 27 (SUTURE) ×3 IMPLANT
SYR BULB IRRIGATION 50ML (SYRINGE) IMPLANT
TOWEL OR 17X24 6PK STRL BLUE (TOWEL DISPOSABLE) ×3 IMPLANT
TRAY FOLEY BAG SILVER LF 14FR (SET/KITS/TRAYS/PACK) ×3 IMPLANT

## 2018-02-07 NOTE — Anesthesia Preprocedure Evaluation (Signed)
Anesthesia Evaluation  Patient identified by MRN, date of birth, ID band Patient awake    Reviewed: Allergy & Precautions, NPO status , Patient's Chart, lab work & pertinent test results  Airway Mallampati: III  TM Distance: >3 FB Neck ROM: Full    Dental no notable dental hx. (+) Teeth Intact   Pulmonary former smoker,    Pulmonary exam normal breath sounds clear to auscultation       Cardiovascular negative cardio ROS Normal cardiovascular exam Rhythm:Regular Rate:Normal     Neuro/Psych negative neurological ROS  negative psych ROS   GI/Hepatic Neg liver ROS, GERD  Controlled and Medicated,  Endo/Other  Obesity  Renal/GU negative Renal ROS  negative genitourinary   Musculoskeletal   Abdominal (+) + obese,   Peds  Hematology  (+) anemia ,   Anesthesia Other Findings   Reproductive/Obstetrics (+) Pregnancy Primary HS outbreak                             Anesthesia Physical Anesthesia Plan  ASA: II  Anesthesia Plan: Spinal   Post-op Pain Management:    Induction:   PONV Risk Score and Plan: 4 or greater  Airway Management Planned: Natural Airway  Additional Equipment:   Intra-op Plan:   Post-operative Plan:   Informed Consent: I have reviewed the patients History and Physical, chart, labs and discussed the procedure including the risks, benefits and alternatives for the proposed anesthesia with the patient or authorized representative who has indicated his/her understanding and acceptance.   Dental advisory given  Plan Discussed with: CRNA, Anesthesiologist and Surgeon  Anesthesia Plan Comments:         Anesthesia Quick Evaluation

## 2018-02-07 NOTE — Anesthesia Procedure Notes (Addendum)
Spinal  Patient location during procedure: OR Start time: 02/07/2018 11:26 AM Staffing Anesthesiologist: Mal AmabileFoster, Theora Vankirk, MD Performed: anesthesiologist  Preanesthetic Checklist Completed: patient identified, site marked, surgical consent, pre-op evaluation, timeout performed, IV checked, risks and benefits discussed and monitors and equipment checked Spinal Block Patient position: sitting Prep: site prepped and draped and DuraPrep Patient monitoring: heart rate, cardiac monitor, continuous pulse ox and blood pressure Approach: midline Location: L3-4 Injection technique: single-shot Needle Needle type: Pencan  Needle gauge: 24 G Needle length: 9 cm Needle insertion depth: 7 cm Assessment Sensory level: T4 Additional Notes Patient tolerated procedure well. Adequate sensory level.

## 2018-02-07 NOTE — Anesthesia Postprocedure Evaluation (Signed)
Anesthesia Post Note  Patient: Krystal Murray  Procedure(s) Performed: CESAREAN SECTION (N/A Abdomen)     Patient location during evaluation: PACU Anesthesia Type: Spinal Level of consciousness: awake and alert and oriented Pain management: pain level controlled Vital Signs Assessment: post-procedure vital signs reviewed and stable Respiratory status: spontaneous breathing, nonlabored ventilation and respiratory function stable Cardiovascular status: blood pressure returned to baseline and stable Postop Assessment: no headache, no backache, spinal receding, patient able to bend at knees and no apparent nausea or vomiting Anesthetic complications: no    Last Vitals:  Vitals:   02/07/18 1402 02/07/18 1415  BP: 118/66 (!) 118/59  Pulse: 66 69  Resp: 20 17  Temp:    SpO2:  98%    Last Pain:  Vitals:   02/07/18 1415  TempSrc:   PainSc: 6    Pain Goal:                 Laurren Lepkowski A.

## 2018-02-07 NOTE — H&P (Signed)
Krystal Murray is a 27 y.o. female [id number] IUP 41 0/7 weeks presenting for primary c section secondary to primary HSV outbreak. + FM. Denies vaginal bleeding, LOF or ut ctx. Prenatal unremarkable to this point.  OB History    Gravida Para Term Preterm AB Living   5 2 2  0 2 2   SAB TAB Ectopic Multiple Live Births   1 1 0 0 2     Past Medical History:  Diagnosis Date  . No pertinent past medical history   . UTI (lower urinary tract infection)    Past Surgical History:  Procedure Laterality Date  . NO PAST SURGERIES     Family History: family history includes Cancer in her cousin, maternal aunt, and maternal grandmother; Diabetes in her maternal aunt; Hypertension in her maternal aunt. Social History:  reports that she quit smoking about 8 months ago. she has never used smokeless tobacco. She reports that she does not drink alcohol or use drugs.     Maternal Diabetes: No Genetic Screening: Normal Maternal Ultrasounds/Referrals: Normal Fetal Ultrasounds or other Referrals:  None Maternal Substance Abuse:  No Significant Maternal Medications:  None Significant Maternal Lab Results:  None Other Comments:  None  Review of Systems  Constitutional: Negative.   Respiratory: Negative.   Cardiovascular: Negative.   Gastrointestinal: Negative.   Genitourinary:       HSV appearing lesions   History   Blood pressure 126/80, pulse 97, temperature 98 F (36.7 C), temperature source Oral, resp. rate 18, height 5\' 6"  (1.676 m), weight 105.2 kg (232 lb), unknown if currently breastfeeding. Exam Physical Exam  Constitutional: She appears well-developed and well-nourished.  Cardiovascular: Normal rate, regular rhythm and normal heart sounds.  Respiratory: Effort normal and breath sounds normal.  GI: Soft. Bowel sounds are normal.  FH = dates  Genitourinary:  Genitourinary Comments: HSV appearing lesions    Prenatal labs: ABO, Rh: --/--/A NEG, A NEG (08/30 2340) Antibody: NEG  (08/30 2340) Rubella: 7.76 (08/06 1637) RPR: Non Reactive (11/20 1021)  HBsAg: Negative (08/06 1637)  HIV: Non Reactive (11/20 1021)  GBS: Positive (01/10 1138)   Assessment/Plan: IUP 41 weeks Primary HSV outbreak  Admit for primary c section d/t above indications. R/B/Post op care reviewed with pt. Pt verbalized understanding and agrees to proceed.   Hermina StaggersMichael L Carmin Alvidrez 02/07/2018, 10:57 AM

## 2018-02-07 NOTE — Transfer of Care (Signed)
Immediate Anesthesia Transfer of Care Note  Patient: Krystal Murray  Procedure(s) Performed: CESAREAN SECTION (N/A Abdomen)  Patient Location: PACU  Anesthesia Type:Spinal  Level of Consciousness: awake, alert  and oriented  Airway & Oxygen Therapy: Patient Spontanous Breathing  Post-op Assessment: Report given to RN and Post -op Vital signs reviewed and stable  Post vital signs: Reviewed and stable  Last Vitals:  Vitals:   02/07/18 0915 02/07/18 1252  BP: 126/80 (!) 105/57  Pulse: 97 80  Resp: 18 16  Temp: 36.7 C   SpO2:  100%    Last Pain:  Vitals:   02/07/18 0915  TempSrc: Oral         Complications: No apparent anesthesia complications

## 2018-02-07 NOTE — Op Note (Signed)
Vianney Donell Sievert PROCEDURE DATE: 02/07/2018  PREOPERATIVE DIAGNOSES: Intrauterine pregnancy at [redacted]w[redacted]d weeks gestation; herpes virus infection, primary outbreak  POSTOPERATIVE DIAGNOSES: The same  PROCEDURE: Primary Low Transverse Cesarean Section  SURGEON, primary: Nettie Elm, MD SURGEON, fellow: Rolm Bookbinder, DO  ANESTHESIOLOGY TEAM: Anesthesiologist: Mal Amabile, MD CRNA: Algis Greenhouse, CRNA; Graciela Husbands, CRNA  INDICATIONS: Krystal Murray is a 27 y.o. [phone] at [redacted]w[redacted]d here for cesarean section secondary to the indications listed under preoperative diagnoses; please see preoperative note for further details.  The risks of cesarean section were discussed with the patient including but were not limited to: bleeding which may require transfusion or reoperation; infection which may require antibiotics; injury to bowel, bladder, ureters or other surrounding organs; injury to the fetus; need for additional procedures including hysterectomy in the event of a life-threatening hemorrhage; placental abnormalities wth subsequent pregnancies, incisional problems, thromboembolic phenomenon and other postoperative/anesthesia complications.   The patient concurred with the proposed plan, giving informed written consent for the procedure.    FINDINGS:  Viable female infant in cephalic presentation.  Apgars 9 and 9.  Clear amniotic fluid.  Intact placenta, three vessel cord.  Normal uterus, fallopian tubes and ovaries bilaterally.  ANESTHESIA: Spinal  ESTIMATED BLOOD LOSS: 3070 ml URINE OUTPUT:  200 ml SPECIMENS: Placenta sent to L&D COMPLICATIONS: postpartum hemmorhage  PROCEDURE IN DETAIL:  The patient preoperatively received intravenous antibiotics and had sequential compression devices applied to her lower extremities.  She was then taken to the operating room where spinal anesthesia was administered and was found to be adequate. She was then placed in a dorsal supine position with a  leftward tilt, and prepped and draped in a sterile manner.  A foley catheter was placed into her bladder and attached to constant gravity.  After an adequate timeout was performed, a Pfannenstiel skin incision was made with scalpel and carried through to the underlying layer of fascia. The fascia was incised in the midline, and this incision was extended bilaterally using the Mayo scissors.  Kocher clamps were applied to the superior aspect of the fascial incision and the underlying rectus muscles were dissected off bluntly.  A similar process was carried out on the inferior aspect of the fascial incision. The rectus muscles were separated in the midline bluntly and the peritoneum was entered bluntly. Attention was turned to the lower uterine segment where a low transverse hysterotomy was made with a scalpel and extended bilaterally bluntly.  The infant was successfully delivered, the cord was clamped and cut after one minute, and the infant was handed over to the awaiting neonatology team. Uterine massage was then administered, and the placenta delivered intact with a three-vessel cord. The uterus was then cleared of clots and debris.  The hysterotomy was closed with 0 chromic in a running locked fashion, and an imbricating layer was also placed with 0 chromic.  There was significant amount of bleeding prior to hysterotomy closure and mild to moderate persisted after closure. Figure-of-eight 0 chromicserosal stitches were placed to help with hemostasis.  The pelvis was cleared of all clot and debris. Hemostasis was confirmed on all surfaces.  The peritoneum was closed with a 0 Vicryl running stitch. The fascia was then closed using 0 Vicryl in a running fashion.  The subcutaneous layer was irrigated, then reapproximated with 2-0 plain gut interrupted stitches  The skin was closed with a 4-0 Vicryl subcuticular stitch. The patient tolerated the procedure well. Sponge, lap, instrument and needle counts were correct  x  3.  She was taken to the recovery room in stable condition.   EBL was approximately 3070 mL. Will monitor patient closely and plan for repeat CBC at 1600. If patient decompensates or becomes symptomatic, will consider 2 Units prbcs. Hold lovenox postoperatively until 0800 tomorrow morning. Maintain foley catheter until 0800 tomorrow morning.    Rolm BookbinderAmber Dawnette Mione, DO Faculty Practice, [facility]

## 2018-02-07 NOTE — H&P (Deleted)
Obstetric Preoperative History and Physical  Krystal Murray is a 27 y.o. [id number] with IUP at 2826w0d presenting for primary scheduled cesarean section for genital HSV outbreak.  No acute concerns.   Prenatal Course Source of Care: [place]   Pregnancy complications or risks: Patient Active Problem List   Diagnosis Date Noted  . Supervision of other normal pregnancy, antepartum 07/26/2017   She plans to bottle feed She desires OCPs for postpartum contraception.   Prenatal labs and studies: ABO, Rh: --/--/A NEG, A NEG (08/30 2340) Antibody: NEG (08/30 2340) Rubella: 7.76 (08/06 1637) RPR: Non Reactive (11/20 1021)  HBsAg: Negative (08/06 1637)  HIV: Non Reactive (11/20 1021)  AVW:UJWJXBJYGBS:Positive (01/10 1138) 1 hr Glucola  wnl Genetic screening declined Anatomy US normal  Prenatal Transfer Tool  Maternal Diabetes: No Genetic Screening: Declined Maternal Ultrasounds/Referrals: Normal Fetal Ultrasounds or other Referrals:  None Maternal Substance Abuse:  No Significant Maternal Medications:  None Significant Maternal Lab Results: Lab values include: Group B Strep positive  Past Medical History:  Diagnosis Date  . No pertinent past medical history   . UTI (lower urinary tract infection)     Past Surgical History:  Procedure Laterality Date  . NO PAST SURGERIES      OB History  Gravida Para Term Preterm AB Living  5 2 2  0 2 2  SAB TAB Ectopic Multiple Live Births  1 1 0 0 2    # Outcome Date GA Lbr Len/2nd Weight Sex Delivery Anes PTL Lv  5 Current           4 SAB 2016 4586w0d         3 Term 09/02/12 4776w6d 239:07 / 00:05 2.735 kg (6 lb 0.5 oz) F Vag-Spont EPI  LIV  2 TAB 2012          1 Term 10/30/09 3458w0d 27:00 3.771 kg (8 lb 5 oz) F Vag-Spont EPI  LIV      Social History   Socioeconomic History  . Marital status: Single    Spouse name: None  . Number of children: None  . Years of education: None  . Highest education level: None  Social Needs  . Financial resource  strain: None  . Food insecurity - worry: None  . Food insecurity - inability: None  . Transportation needs - medical: None  . Transportation needs - non-medical: None  Occupational History  . None  Tobacco Use  . Smoking status: Former Smoker    Last attempt to quit: 05/2017    Years since quitting: 0.7  . Smokeless tobacco: Never Used  Substance and Sexual Activity  . Alcohol use: No  . Drug use: No  . Sexual activity: Yes    Birth control/protection: None    Comment: last sex May 2017  Other Topics Concern  . None  Social History Narrative  . None    Family History  Problem Relation Age of Onset  . Cancer Maternal Aunt        uterine  . Diabetes Maternal Aunt   . Hypertension Maternal Aunt   . Cancer Maternal Grandmother        breastt  . Cancer Cousin        leaukemia    Medications Prior to Admission  Medication Sig Dispense Refill Last Dose  . Elastic Bandages & Supports (COMFORT FIT MATERNITY SUPP SM) MISC 1 application daily by Does not apply route. (Patient not taking: Reported on 01/17/2018) 1 each 0 Not Taking at  Unknown time  . omeprazole (PRILOSEC) 20 MG capsule Take 1 capsule (20 mg total) 2 (two) times daily before a meal by mouth. (Patient not taking: Reported on 02/01/2018) 60 capsule 5 Not Taking at Unknown time  . Prenatal Vit-Fe Fumarate-FA (PRENATAL MULTIVITAMIN) TABS tablet Take 1 tablet by mouth daily at 12 noon.   Past Month at Unknown time  . valACYclovir (VALTREX) 1000 MG tablet Take 1 tablet (1,000 mg total) by mouth 2 (two) times daily for 7 days. 14 tablet 0     No Known Allergies  Review of Systems: Negative except for what is mentioned in HPI.  Physical Exam: BP 126/80   Pulse 97   Temp 98 F (36.7 C) (Oral)   Resp 18   Ht 5\' 6"  (1.676 m)   Wt 105.2 kg (232 lb)   BMI 37.45 kg/m  CONSTITUTIONAL: Well-developed, well-nourished female in no acute distress.  HENT:  Normocephalic, atraumatic. Oropharynx is clear and moist EYES:  Conjunctivae and EOM are normal. No scleral icterus.  NECK: Normal range of motion, supple SKIN: Skin is warm and dry. No rash noted. Not diaphoretic. No erythema. NEUROLGIC: Alert and oriented to person, place, and time. Normal reflexes, muscle tone coordination. No cranial nerve deficit noted. PSYCHIATRIC: Normal mood and affect. Normal behavior. CARDIOVASCULAR: Normal heart rate noted, regular rhythm RESPIRATORY: Effort and breath sounds normal, no problems with respiration noted ABDOMEN: Soft, nontender, nondistended, gravid.  PELVIC: Deferred MUSCULOSKELETAL: Normal range of motion. No edema and no tenderness. 2+ distal pulses.   Pertinent Labs/Studies:   Results for orders placed or performed during the hospital encounter of 02/07/18 (from the past 72 hour(s))  CBC     Status: Abnormal   Collection Time: 02/07/18  9:28 AM  Result Value Ref Range   WBC 10.5 4.0 - 10.5 K/uL   RBC 3.21 (L) 3.87 - 5.11 MIL/uL   Hemoglobin 8.6 (L) 12.0 - 15.0 g/dL   HCT 40.9 (L) 81.1 - 91.4 %   MCV 82.2 78.0 - 100.0 fL   MCH 26.8 26.0 - 34.0 pg   MCHC 32.6 30.0 - 36.0 g/dL   RDW 78.2 95.6 - 21.3 %   Platelets 182 150 - 400 K/uL    Comment: Performed at [facility], [address] [place], [place] [address]    Assessment and Plan :Krystal Murray is a 27 y.o. [id number] at [redacted]w[redacted]d being admitted for primary scheduled cesarean section for genital HSV outbreak. The risks of cesarean section discussed with the patient included but were not limited to: bleeding which may require transfusion or reoperation; infection which may require antibiotics; injury to bowel, bladder, ureters or other surrounding organs; injury to the fetus; need for additional procedures including hysterectomy in the event of a life-threatening hemorrhage; placental abnormalities wth subsequent pregnancies, incisional problems, thromboembolic phenomenon and other postoperative/anesthesia complications. The patient concurred with the  proposed plan, giving informed written consent for the procedure. Patient has been NPO since last night she will remain NPO for procedure. Anesthesia and OR aware. Preoperative prophylactic antibiotics and SCDs ordered on call to the OR. To OR when ready.  MOF: bottle/formula MOC: OCPs  Rolm Bookbinder, DO OB Fellow Faculty Practice, [facility]

## 2018-02-08 LAB — RPR: RPR: NONREACTIVE

## 2018-02-08 LAB — CBC
HEMATOCRIT: 16.4 % — AB (ref 36.0–46.0)
Hemoglobin: 5.4 g/dL — CL (ref 12.0–15.0)
MCH: 27.1 pg (ref 26.0–34.0)
MCHC: 32.9 g/dL (ref 30.0–36.0)
MCV: 82.4 fL (ref 78.0–100.0)
PLATELETS: 152 10*3/uL (ref 150–400)
RBC: 1.99 MIL/uL — ABNORMAL LOW (ref 3.87–5.11)
RDW: 15 % (ref 11.5–15.5)
WBC: 17.2 10*3/uL — AB (ref 4.0–10.5)

## 2018-02-08 LAB — PREPARE RBC (CROSSMATCH)

## 2018-02-08 MED ORDER — RHO D IMMUNE GLOBULIN 1500 UNIT/2ML IJ SOSY
300.0000 ug | PREFILLED_SYRINGE | Freq: Once | INTRAMUSCULAR | Status: AC
  Administered 2018-02-08: 300 ug via INTRAVENOUS
  Filled 2018-02-08: qty 2

## 2018-02-08 MED ORDER — OXYCODONE HCL 5 MG PO TABS: 5.0000 mg | ORAL_TABLET | ORAL | Status: DC | PRN

## 2018-02-08 MED ORDER — SODIUM CHLORIDE 0.9 % IV SOLN: Freq: Once | INTRAVENOUS | Status: DC

## 2018-02-08 MED ORDER — KETOROLAC TROMETHAMINE 30 MG/ML IJ SOLN
30.0000 mg | Freq: Four times a day (QID) | INTRAMUSCULAR | Status: AC
  Administered 2018-02-08 – 2018-02-09 (×3): 30 mg via INTRAVENOUS
  Filled 2018-02-08 (×3): qty 1

## 2018-02-08 MED ORDER — IBUPROFEN 800 MG PO TABS: 800.0000 mg | ORAL_TABLET | Freq: Three times a day (TID) | ORAL | Status: DC | PRN

## 2018-02-08 MED ORDER — OXYCODONE HCL 5 MG PO TABS
10.0000 mg | ORAL_TABLET | ORAL | Status: DC | PRN
  Administered 2018-02-08 – 2018-02-10 (×7): 10 mg via ORAL
  Filled 2018-02-08 (×7): qty 2

## 2018-02-08 MED ORDER — OXYCODONE HCL 5 MG PO TABS
5.0000 mg | ORAL_TABLET | ORAL | Status: DC | PRN
  Administered 2018-02-08 (×2): 5 mg via ORAL
  Filled 2018-02-08 (×2): qty 1

## 2018-02-08 MED ORDER — IBUPROFEN 600 MG PO TABS
600.0000 mg | ORAL_TABLET | Freq: Four times a day (QID) | ORAL | Status: DC
  Administered 2018-02-09 – 2018-02-10 (×5): 600 mg via ORAL
  Filled 2018-02-08 (×6): qty 1

## 2018-02-08 MED ORDER — OXYCODONE-ACETAMINOPHEN 5-325 MG PO TABS
1.0000 | ORAL_TABLET | Freq: Once | ORAL | Status: AC
  Administered 2018-02-08: 1 via ORAL
  Filled 2018-02-08: qty 1

## 2018-02-08 NOTE — Progress Notes (Signed)
POSTPARTUM PROGRESS NOTE  POD# 1  Subjective: Krystal Murray is a 27 y.o. [id number] s/p LTCS at 378w1d.  Patient reports pain being uncontrolled. She has refused taking NSAIDs and Tylenol, reports she wants to get something stronger. Has taken oxycodone which has not helped. She has ambulated to the bathroom this morning without any dizziness or lightheadedness.    Objective: Blood pressure (!) 124/54, pulse 79, temperature 98.1 F (36.7 C), temperature source Oral, resp. rate 18, height 5\' 6"  (1.676 m), weight 232 lb (105.2 kg), SpO2 100 %, unknown if currently breastfeeding.  Physical Exam:  General: alert, cooperative and no distress Chest: no respiratory distress Heart:regular rate, distal pulses intact Abdomen: soft, nontender,  Uterine Fundus: firm, appropriately tender DVT Evaluation: No calf swelling or tenderness Extremities: trace edema  CBC Latest Ref Rng & Units 02/08/2018 02/07/2018 02/07/2018  WBC 4.0 - 10.5 K/uL 17.2(H) 15.5(H) 11.6(H)  Hemoglobin 12.0 - 15.0 g/dL 4.5(WU5.4(LL) 6.8(LL) 6.5(LL)  Hematocrit 36.0 - 46.0 % 16.4(L) 21.0(L) 20.0(L)  Platelets 150 - 400 K/uL 152 129(L) 130(L)     Assessment/Plan: Krystal Murray is a 27 y.o. [id number] s/p pLTCS for active genital HSV at 138w1d   POD#1 - Pain uncontrolled. Will switch Toradol from IM to IV as she has refused IM doses. Add oxycodone 10 mg for severe pain (only getting 5 mg now). Also encouraged to take Tylenol prn.  Anemia: 2 units PRBCs ordered Contraception: Depo Dispo: likely d/c home POD#2 or #3 pending pain control   LOS: 1 day   Kandra NicolasJulie P DegeleMD 02/08/2018, 9:37 AM

## 2018-02-08 NOTE — Addendum Note (Signed)
Addendum  created 02/08/18 0810 by Elgie CongoMalinova, [place] Wank H, CRNA   Sign clinical note

## 2018-02-08 NOTE — Progress Notes (Signed)
Hgb 5.4/Plts 152. Patient has been asymptomatic all night, vital signs & urine output have been WNL. Dr. Chanetta Marshallimberlake notified, no new orders at this time.

## 2018-02-08 NOTE — Anesthesia Postprocedure Evaluation (Signed)
Anesthesia Post Note  Patient: Krystal Murray  Procedure(s) Performed: CESAREAN SECTION (N/A Abdomen)     Patient location during evaluation: Mother Baby Anesthesia Type: Spinal Level of consciousness: awake and alert Pain management: pain level controlled Vital Signs Assessment: post-procedure vital signs reviewed and stable Respiratory status: spontaneous breathing, nonlabored ventilation and respiratory function stable Cardiovascular status: stable Postop Assessment: no headache, no backache, spinal receding, patient able to bend at knees, adequate PO intake and no apparent nausea or vomiting Anesthetic complications: no    Last Vitals:  Vitals:   02/08/18 0009 02/08/18 0345  BP: 114/62 (!) 124/54  Pulse: 78 79  Resp: 18 18  Temp: 36.7 C 36.7 C  SpO2: 98% 100%    Last Pain:  Vitals:   02/08/18 0521  TempSrc:   PainSc: 9    Pain Goal: Patients Stated Pain Goal: 1 (02/07/18 1652)               Laban EmperorMalinova,Loukas Antonson Hristova

## 2018-02-08 NOTE — Progress Notes (Signed)
POSTPARTUM PROGRESS NOTE  Post Partum Day #1/Post-Op Day #1  Subjective: Krystal Murray is a 27 y.o. [id number] s/p pLTCS at 3114w1d.  No acute events overnight.  Pt has not ambulated, SCDs in place. Urine output WNL per catheter. Has not attempted po intake.  She denies nausea or vomiting.  Pain is poorly controlled, states that she does not routinely take pain medication but her pain has had no relief with 5mg  Oxycodone q4h. Patient states "don't put any tylenol or motrin with it, because it don't work."  She has had flatus. She has not had bowel movement.  Lochia Minimal.   Objective: Blood pressure (!) 124/54, pulse 79, temperature 98.1 F (36.7 C), temperature source Oral, resp. rate 18, height 5\' 6"  (1.676 m), weight 105.2 kg (232 lb), SpO2 100 %, unknown if currently breastfeeding.  Physical Exam:  General: Alert, cooperative and mildly uncomfortable Skin: Warm, and dry Heart: Regular rate and rhythm, distal pulses intact Lungs: No respiratory distress, CTAB without wheezing or rales. Abdomen: soft with tenderness to palpation near the incision site and toward the umbilicus  Uterine Fundus: firm, appropriately tender Incision: clean/dry/intact with some blood tinged dressing.  DVT Evaluation: No calf swelling or tenderness, SCDs in place Extremities: 1+ edema  Recent Labs    02/07/18 1554 02/08/18 0519  HGB 6.8* 5.4*  HCT 21.0* 16.4*    Assessment/Plan: Krystal Murray is a 27 y.o. [id number] s/p pLTCS at 2214w1d   PPD#1 - Doing well, but in pain  Contraception: depo Feeding: bottle Dispo: Plan for discharge 02/09/18.   LOS: 1 day   Claudine Moutonyler Kaileia Flow 02/08/2018, 7:48 AM

## 2018-02-09 LAB — CBC
HCT: 18.1 % — ABNORMAL LOW (ref 36.0–46.0)
Hemoglobin: 6.1 g/dL — CL (ref 12.0–15.0)
MCH: 27.7 pg (ref 26.0–34.0)
MCHC: 33.7 g/dL (ref 30.0–36.0)
MCV: 82.3 fL (ref 78.0–100.0)
PLATELETS: 137 10*3/uL — AB (ref 150–400)
RBC: 2.2 MIL/uL — AB (ref 3.87–5.11)
RDW: 14.9 % (ref 11.5–15.5)
WBC: 14.5 10*3/uL — AB (ref 4.0–10.5)

## 2018-02-09 LAB — RH IG WORKUP (INCLUDES ABO/RH)
ABO/RH(D): A NEG
Fetal Screen: NEGATIVE
Gestational Age(Wks): 41
UNIT DIVISION: 0

## 2018-02-09 MED ORDER — SODIUM CHLORIDE 0.9 % IV SOLN
510.0000 mg | Freq: Once | INTRAVENOUS | Status: AC
  Administered 2018-02-09: 510 mg via INTRAVENOUS
  Filled 2018-02-09: qty 17

## 2018-02-09 MED ORDER — SODIUM CHLORIDE 0.9 % IV SOLN: Freq: Once | INTRAVENOUS | Status: DC

## 2018-02-09 NOTE — Progress Notes (Signed)
POSTPARTUM PROGRESS NOTE  POD# 2  Subjective: Chenille Donell SievertU Layson is a 27 y.o. [id number] s/p LTCS at 2767w1d.  Patient reports pain better controlled. Some SOB when ambulating to bathroom. Would like to go home when able.    Objective: Blood pressure 129/78, pulse 88, temperature 98.2 F (36.8 C), temperature source Oral, resp. rate 18, height 5\' 6"  (1.676 m), weight 105.2 kg (232 lb), SpO2 100 %, unknown if currently breastfeeding.  Physical Exam:  General: alert, cooperative and no distress Chest: no respiratory distress Heart:regular rate, distal pulses intact Abdomen: soft, nontender,  Uterine Fundus: firm, appropriately tender DVT Evaluation: No calf swelling or tenderness Extremities: trace edema  CBC Latest Ref Rng & Units 02/09/2018 02/08/2018 02/07/2018  WBC 4.0 - 10.5 K/uL 14.5(H) 17.2(H) 15.5(H)  Hemoglobin 12.0 - 15.0 g/dL 6.1(LL) 5.4(LL) 6.8(LL)  Hematocrit 36.0 - 46.0 % 18.1(L) 16.4(L) 21.0(L)  Platelets 150 - 400 K/uL 137(L) 152 129(L)     Assessment/Plan: Audree Camelatience U Simons is a 27 y.o. 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  Anemia: IV ferraheme  Contraception: Depo Dispo: likely d/c home POD#3    LOS: 2 days   Loni MuseKate Jentri Aye, MD 02/09/2018, 7:45 AM

## 2018-02-10 ENCOUNTER — Encounter (HOSPITAL_COMMUNITY): Payer: Self-pay | Admitting: *Deleted

## 2018-02-10 LAB — CBC
HEMATOCRIT: 19 % — AB (ref 36.0–46.0)
Hemoglobin: 6.5 g/dL — CL (ref 12.0–15.0)
MCH: 28.3 pg (ref 26.0–34.0)
MCHC: 34.2 g/dL (ref 30.0–36.0)
MCV: 82.6 fL (ref 78.0–100.0)
Platelets: 144 10*3/uL — ABNORMAL LOW (ref 150–400)
RBC: 2.3 MIL/uL — ABNORMAL LOW (ref 3.87–5.11)
RDW: 15.1 % (ref 11.5–15.5)
WBC: 12.9 10*3/uL — ABNORMAL HIGH (ref 4.0–10.5)

## 2018-02-10 MED ORDER — FERROUS SULFATE 325 (65 FE) MG PO TABS: 325.0000 mg | ORAL_TABLET | Freq: Two times a day (BID) | ORAL | 3 refills | Status: DC

## 2018-02-10 MED ORDER — OXYCODONE-ACETAMINOPHEN 5-325 MG PO TABS
1.0000 | ORAL_TABLET | ORAL | Status: DC | PRN
  Administered 2018-02-10 (×2): 1 via ORAL
  Filled 2018-02-10 (×2): qty 1

## 2018-02-10 MED ORDER — IBUPROFEN 600 MG PO TABS: 600.0000 mg | ORAL_TABLET | Freq: Four times a day (QID) | ORAL | 1 refills | Status: DC | PRN

## 2018-02-10 MED ORDER — OXYCODONE-ACETAMINOPHEN 5-325 MG PO TABS: 1.0000 | ORAL_TABLET | ORAL | 0 refills | Status: DC | PRN

## 2018-02-10 NOTE — Discharge Instructions (Signed)

## 2018-02-10 NOTE — Discharge Summary (Signed)
OB Discharge Summary     Patient Name: Krystal Murray DOB: [birth year] MRN: [id number]  Date of admission: 02/07/2018 Delivering MD: Hermina Staggers   Date of discharge: 02/10/2018  Admitting diagnosis: C-SECTION Intrauterine pregnancy: [redacted]w[redacted]d     Secondary diagnosis:  Principal Problem:   Supervision of other normal pregnancy, antepartum Active Problems:   S/P C-section   HSV-2 (herpes simplex virus 2) infection   Status post C-section  Additional problems: none     Discharge diagnosis: Term Pregnancy Delivered and Anemia                                                                                                Post partum procedures:blood transfusion ; feraheme; rhogam  Augmentation: N/A  Complications: None  Hospital course:  Scheduled C/S   27 y.o. yo [id number] at [redacted]w[redacted]d was admitted to the hospital 02/07/2018 for scheduled cesarean section with the following indication:Active HSV.  Membrane Rupture Time/Date: 11:48 AM ,02/07/2018   Patient delivered a Viable infant.02/07/2018  Details of operation can be found in separate operative note.  She had an intraoperative EBL of 3070cc with a POD#1 Hgb of 5.4 and was transfused 2u PRBC. She also rec'd a Feraheme dosage on POD#2. On POD#3 her Hgb was 6.5 and she reported doing well. Patient denies dizziness with ambulation. She is ambulating, tolerating a regular diet, passing flatus, and urinating well. Patient is discharged home in stable condition on  02/10/18. Her pain meds were changed from Oxy IR to Percocet in an attempt to better control her discomfort.         Physical exam  Vitals:   02/09/18 0451 02/09/18 0826 02/09/18 0933 02/09/18 1919  BP: 129/78 124/73 126/76 132/82  Pulse: 88 81 89 96  Resp: 18 20 20 19   Temp: 98.2 F (36.8 C) 98.5 F (36.9 C) 97.6 F (36.4 C) 98 F (36.7 C)  TempSrc: Oral Oral Oral Oral  SpO2:      Weight:      Height:       General: alert and cooperative Lochia:  appropriate Uterine Fundus: firm Incision: honeycomb saturated, unchanged DVT Evaluation: No evidence of DVT seen on physical exam. Labs: Lab Results  Component Value Date   WBC 12.9 (H) 02/10/2018   HGB 6.5 (LL) 02/10/2018   HCT 19.0 (L) 02/10/2018   MCV 82.6 02/10/2018   PLT 144 (L) 02/10/2018   CMP Latest Ref Rng & Units 02/07/2018  Glucose 65 - 99 mg/dL -  BUN 6 - 20 mg/dL -  Creatinine 1.47 - 8.29 mg/dL 5.62  Sodium 130 - 865 mmol/L -  Potassium 3.5 - 5.1 mmol/L -  Chloride 101 - 111 mmol/L -  CO2 22 - 32 mmol/L -  Calcium 8.9 - 10.3 mg/dL -  Total Protein 6.5 - 8.1 g/dL -  Total Bilirubin 0.3 - 1.2 mg/dL -  Alkaline Phos 38 - 784 U/L -  AST 15 - 41 U/L -  ALT 14 - 54 U/L -    Discharge instruction: per After Visit Summary and "Baby and Me Booklet".  After  visit meds:  Allergies as of 02/10/2018   No Known Allergies     Medication List    STOP taking these medications   COMFORT FIT MATERNITY SUPP SM Misc   omeprazole 20 MG capsule Commonly known as:  PRILOSEC     TAKE these medications   ferrous sulfate 325 (65 FE) MG tablet Take 1 tablet (325 mg total) by mouth 2 (two) times daily with a meal.   ibuprofen 600 MG tablet Commonly known as:  ADVIL,MOTRIN Take 1 tablet (600 mg total) by mouth every 6 (six) hours as needed.   oxyCODONE-acetaminophen 5-325 MG tablet Commonly known as:  PERCOCET/ROXICET Take 1-2 tablets by mouth every 4 (four) hours as needed for moderate pain.   prenatal multivitamin Tabs tablet Take 1 tablet by mouth daily at 12 noon.       Diet: routine diet  Activity: Advance as tolerated. Pelvic rest for 6 weeks.   Outpatient follow ZO:XWRUEAVWup:incision check in 1-2 weeks, then 4 week PP visit Follow up Appt: Future Appointments  Date Time Provider Department Center  02/15/2018  9:00 AM [facility] NURSE [facility] None  03/08/2018  9:00 AM Brock BadHarper, Charles A, MD [facility] None   Follow up Visit:No Follow-up on file.  Postpartum contraception:  Depo Provera  Newborn Data: Live born female  Birth Weight: 8 lb 2.9 oz (3710 g) APGAR: 9, 9  Newborn Delivery   Birth date/time:  02/07/2018 11:48:00 Delivery type:  C-Section, Low Transverse C-section categorization:  Primary     Baby Feeding: Bottle Disposition:home with mother   02/10/2018 Cam HaiSHAW, Caryssa Elzey, CNM 7:33 AM

## 2018-02-11 LAB — BPAM RBC
BLOOD PRODUCT EXPIRATION DATE: 201902252359
BLOOD PRODUCT EXPIRATION DATE: 201902252359
Blood Product Expiration Date: 201903032359
Blood Product Expiration Date: 201903152359
ISSUE DATE / TIME: 201902191658
ISSUE DATE / TIME: 201902191246
Unit Type and Rh: 600
Unit Type and Rh: 600
Unit Type and Rh: 9500
Unit Type and Rh: 9500

## 2018-02-11 LAB — TYPE AND SCREEN
ABO/RH(D): A NEG
Antibody Screen: NEGATIVE
UNIT DIVISION: 0
Unit division: 0
Unit division: 0
Unit division: 0

## 2018-02-15 ENCOUNTER — Ambulatory Visit: Payer: Medicaid Other

## 2018-02-16 ENCOUNTER — Encounter (HOSPITAL_COMMUNITY): Payer: Self-pay | Admitting: *Deleted

## 2018-02-24 ENCOUNTER — Telehealth: Payer: Self-pay

## 2018-02-24 NOTE — Telephone Encounter (Signed)
Received TC from pp home nurse states pt's wound looks exposed and keloid may be growing. Pt not available for an appt until tomorrow. Pt aware if anything worsens prior to visit, she will report to [facility]. Appt desk notified.

## 2018-02-25 ENCOUNTER — Ambulatory Visit (INDEPENDENT_AMBULATORY_CARE_PROVIDER_SITE_OTHER): Payer: Medicaid Other | Admitting: Advanced Practice Midwife

## 2018-02-25 ENCOUNTER — Encounter: Payer: Self-pay | Admitting: Advanced Practice Midwife

## 2018-02-25 ENCOUNTER — Telehealth: Payer: Self-pay | Admitting: *Deleted

## 2018-02-25 VITALS — BP 108/70 | HR 87 | Wt 209.1 lb

## 2018-02-25 DIAGNOSIS — T8140XA Infection following a procedure, unspecified, initial encounter: Secondary | ICD-10-CM | POA: Insufficient documentation

## 2018-02-25 DIAGNOSIS — R109 Unspecified abdominal pain: Secondary | ICD-10-CM

## 2018-02-25 DIAGNOSIS — G8918 Other acute postprocedural pain: Secondary | ICD-10-CM

## 2018-02-25 DIAGNOSIS — Z1389 Encounter for screening for other disorder: Secondary | ICD-10-CM

## 2018-02-25 MED ORDER — OXYCODONE HCL 5 MG PO TABS: 10.0000 mg | ORAL_TABLET | Freq: Four times a day (QID) | ORAL | 0 refills | Status: DC | PRN

## 2018-02-25 MED ORDER — SULFAMETHOXAZOLE-TRIMETHOPRIM 800-160 MG PO TABS: 1.0000 | ORAL_TABLET | Freq: Two times a day (BID) | ORAL | 0 refills | Status: AC

## 2018-02-25 NOTE — Patient Instructions (Signed)
Incision Care, Adult An incision is a surgical cut that is made through your skin. Most incisions are closed after surgery. Your incision may be closed with stitches (sutures), staples, skin glue, or adhesive strips. You may need to return to your health care provider to have sutures or staples removed. This may occur several days to several weeks after your surgery. The incision needs to be cared for properly to prevent infection. How to care for your incision Incision care   Follow instructions from your health care provider about how to take care of your incision. Make sure you: ? Wash your hands with soap and water before you change the bandage (dressing). If soap and water are not available, use hand sanitizer. ? Change your dressing as told by your health care provider. ? Leave sutures, skin glue, or adhesive strips in place. These skin closures may need to stay in place for 2 weeks or longer. If adhesive strip edges start to loosen and curl up, you may trim the loose edges. Do not remove adhesive strips completely unless your health care provider tells you to do that.  Check your incision area every day for signs of infection. Check for: ? More redness, swelling, or pain. ? More fluid or blood. ? Warmth. ? Pus or a bad smell.  Ask your health care provider how to clean the incision. This may include: ? Using mild soap and water. ? Using a clean towel to pat the incision dry after cleaning it. ? Applying a cream or ointment. Do this only as told by your health care provider. ? Covering the incision with a clean dressing.  Ask your health care provider when you can leave the incision uncovered.  Do not take baths, swim, or use a hot tub until your health care provider approves. Ask your health care provider if you can take showers. You may only be allowed to take sponge baths for bathing. Medicines  If you were prescribed an antibiotic medicine, cream, or ointment, take or apply the  antibiotic as told by your health care provider. Do not stop taking or applying the antibiotic even if your condition improves.  Take over-the-counter and prescription medicines only as told by your health care provider. General instructions  Limit movement around your incision to improve healing. ? Avoid straining, lifting, or exercise for the first month, or for as long as told by your health care provider. ? Follow instructions from your health care provider about returning to your normal activities. ? Ask your health care provider what activities are safe.  Protect your incision from the sun when you are outside for the first 6 months, or for as long as told by your health care provider. Apply sunscreen around the scar or cover it up.  Keep all follow-up visits as told by your health care provider. This is important. Contact a health care provider if:  Your have more redness, swelling, or pain around the incision.  You have more fluid or blood coming from the incision.  Your incision feels warm to the touch.  You have pus or a bad smell coming from the incision.  You have a fever or shaking chills.  You are nauseous or you vomit.  You are dizzy.  Your sutures or staples come undone. Get help right away if:  You have a red streak coming from your incision.  Your incision bleeds through the dressing and the bleeding does not stop with gentle pressure.  The edges of   your incision open up and separate.  You have severe pain.  You have a rash.  You are confused.  You faint.  You have trouble breathing and a fast heartbeat. This information is not intended to replace advice given to you by your health care provider. Make sure you discuss any questions you have with your health care provider. Document Released: 06/26/2005 Document Revised: 08/14/2016 Document Reviewed: 06/24/2016 Elsevier Interactive Patient Education  2018 [employer] Inc[employer]  

## 2018-02-25 NOTE — Progress Notes (Signed)
Post Partum Exam  Krystal Murray is a 27 y.o. [phone] female who presents for a postpartum visit. She is 2 weeks postpartum following a primary low cervical transverse Cesarean section for primary HSV outbreak. I have fully reviewed the prenatal and intrapartum course. The delivery was at 41 gestational weeks.  Anesthesia: spinal. Postpartum course has been abnormal with abdominal pain initially controlled with medications but worsening in the last 3-4 days. Baby's course has been normal. Baby is feeding by bottle - Krystal Murray. Bleeding staining only. Bowel function is normal. Bladder function is normal. Patient is not sexually active. Contraception method is none. Postpartum depression screening:neg  The following portions of the patient's history were reviewed and updated as appropriate: allergies, current medications, past family history, past medical history, past social history, past surgical history and problem list. Last pap smear done 07/26/17 and was Normal  Review of Systems Pertinent items noted in HPI and remainder of comprehensive ROS otherwise negative.    Objective:  Blood pressure 108/70, pulse 87, weight 209 lb 1.6 oz (94.8 kg), unknown if currently breastfeeding.  VS reviewed, nursing note reviewed,  Constitutional: well developed, well nourished, no distress HEENT: normocephalic CV: normal rate Pulm/chest wall: normal effort Abdomen: soft, pain to palpation of lower abdomen generalized Neuro: alert and oriented x 3 Skin: warm, dry, incision well approximated except small 1 cm long area on left side of incision.  No tunneling with cotton swab probe, but clear drainage noted from slightly open area with some odor noted.  Pain to palpation around incision. Psych: affect normal   Assessment.Plan:   1. Postoperative infection, initial encounter --Pt with no fever/chills, no n/v and no changes in her scant bleeding but with increase in pain x 3-4 days and  malodorous drainage from incision.   --Will treat for infection at incision with Bactrim DS BID x 7 days. --Pt to f/u with MD on Monday or Tuesday  - sulfamethoxazole-trimethoprim (BACTRIM DS,SEPTRA DS) 800-160 MG tablet; Take 1 tablet by mouth 2 (two) times daily for 7 days.  Dispense: 14 tablet; Refill: 0  2. Postoperative abdominal pain --Pain medication extended over the weekend until F/U. - oxyCODONE (ROXICODONE) 5 MG immediate release tablet; Take 2 tablets (10 mg total) by mouth every 6 (six) hours as needed for severe pain.  Dispense: 15 tablet; Refill: 0  Sharen CounterLisa Leftwich-Kirby, CNM 12:20 PM

## 2018-02-25 NOTE — Telephone Encounter (Signed)
Attempted to call patient 3 times to get her to come in today for an incision check today, patient not available and voicemail box was not set up.Marland Kitchen..Marland Kitchen

## 2018-03-01 ENCOUNTER — Ambulatory Visit: Payer: Medicaid Other | Admitting: Obstetrics & Gynecology

## 2018-03-02 ENCOUNTER — Ambulatory Visit (INDEPENDENT_AMBULATORY_CARE_PROVIDER_SITE_OTHER): Payer: Medicaid Other | Admitting: Obstetrics

## 2018-03-02 ENCOUNTER — Encounter: Payer: Self-pay | Admitting: Obstetrics

## 2018-03-02 VITALS — BP 88/55 | HR 103 | Wt 206.0 lb

## 2018-03-02 DIAGNOSIS — T8189XA Other complications of procedures, not elsewhere classified, initial encounter: Secondary | ICD-10-CM

## 2018-03-02 NOTE — Progress Notes (Signed)
C-Section :02/07/2018

## 2018-03-02 NOTE — Progress Notes (Signed)
Patient ID: Krystal Murray, female   DOB: 1991/07/12, 27 y.o.   MRN: [id number]  Chief Complaint  Patient presents with  . Wound Check    pt presents for Incision check today w/ no complaints    HPI Krystal Murray is a 27 y.o. female.  Slight tenderness left corner of incision.  No drainage. HPI  Past Medical History:  Diagnosis Date  . No pertinent past medical history   . UTI (lower urinary tract infection)     Past Surgical History:  Procedure Laterality Date  . CESAREAN SECTION N/A 02/07/2018   Procedure: CESAREAN SECTION;  Surgeon: Hermina Staggers, MD;  Location: [facility] BIRTHING SUITES;  Service: Obstetrics;  Laterality: N/A;  . NO PAST SURGERIES      Family History  Problem Relation Age of Onset  . Cancer Maternal Aunt        uterine  . Diabetes Maternal Aunt   . Hypertension Maternal Aunt   . Cancer Maternal Grandmother        breastt  . Cancer Cousin        leaukemia    Social History Social History   Tobacco Use  . Smoking status: Former Smoker    Last attempt to quit: 05/2017    Years since quitting: 0.7  . Smokeless tobacco: Never Used  Substance Use Topics  . Alcohol use: No  . Drug use: No    No Known Allergies  Current Outpatient Medications  Medication Sig Dispense Refill  . ibuprofen (ADVIL,MOTRIN) 600 MG tablet Take 1 tablet (600 mg total) by mouth every 6 (six) hours as needed. 30 tablet 1  . oxyCODONE (ROXICODONE) 5 MG immediate release tablet Take 2 tablets (10 mg total) by mouth every 6 (six) hours as needed for severe pain. 15 tablet 0  . sulfamethoxazole-trimethoprim (BACTRIM DS,SEPTRA DS) 800-160 MG tablet Take 1 tablet by mouth 2 (two) times daily for 7 days. 14 tablet 0  . ferrous sulfate 325 (65 FE) MG tablet Take 1 tablet (325 mg total) by mouth 2 (two) times daily with a meal. (Patient not taking: Reported on 03/02/2018) 60 tablet 3  . Prenatal Vit-Fe Fumarate-FA (PRENATAL MULTIVITAMIN) TABS tablet Take 1 tablet by mouth daily at  12 noon.     No current facility-administered medications for this visit.     Review of Systems Review of Systems Constitutional: negative for fatigue and weight loss Respiratory: negative for cough and wheezing Cardiovascular: negative for chest pain, fatigue and palpitations Gastrointestinal: negative for abdominal pain and change in bowel habits Genitourinary:negative Integument/breast: POSITIVE for tenderness left corner of C/S incision closure Musculoskeletal:negative for myalgias Neurological: negative for gait problems and tremors Behavioral/Psych: negative for abusive relationship, depression Endocrine: negative for temperature intolerance      Blood pressure (!) 88/55, pulse (!) 103, weight 206 lb (93.4 kg), not currently breastfeeding.  Physical Exam Physical Exam           General:  Alert and no distress Abdomen:  soft, NT.  Incision clean, dry and intact.  Tenderness left corner.  50% of 15 min visit spent on counseling and coordination of care.   Data Reviewed CBC  Assessment     1. Problem involving surgical incision - healing well    Plan    Follow up in 2 weeks.  No orders of the defined types were placed in this encounter.  No orders of the defined types were placed in this encounter.    CHARLES A. HARPER  MD 03-02-2018

## 2018-03-07 ENCOUNTER — Telehealth: Payer: Self-pay

## 2018-03-07 NOTE — Telephone Encounter (Signed)
Return TC to pt regarding message pt not ava no vm set up  Pt has appt 03/08/18. For PP

## 2018-03-08 ENCOUNTER — Ambulatory Visit: Payer: Medicaid Other | Admitting: Obstetrics and Gynecology

## 2018-03-16 ENCOUNTER — Ambulatory Visit (INDEPENDENT_AMBULATORY_CARE_PROVIDER_SITE_OTHER): Payer: Medicaid Other | Admitting: Obstetrics and Gynecology

## 2018-03-16 ENCOUNTER — Encounter: Payer: Self-pay | Admitting: Obstetrics and Gynecology

## 2018-03-16 DIAGNOSIS — T8140XA Infection following a procedure, unspecified, initial encounter: Secondary | ICD-10-CM

## 2018-03-16 DIAGNOSIS — Z98891 History of uterine scar from previous surgery: Secondary | ICD-10-CM

## 2018-03-16 MED ORDER — IBUPROFEN 600 MG PO TABS: 600.0000 mg | ORAL_TABLET | Freq: Four times a day (QID) | ORAL | 1 refills | Status: DC | PRN

## 2018-03-16 NOTE — Progress Notes (Signed)
Patient ID: Krystal Murray, female   DOB: Jul 06, 1991, 27 y.o.   MRN: [id number] [name] presents with c/o some drainage and burning sensation from her C section skin incision S/P LTCS 02/07/18 Was seen 02/25/18 for PP visit and noted to have incisional infection. Placed on Bactium DS x 7 days F/U appt 1 week later infection had resolved  Pt denies any Fever or chills.  PE AF VSS Lungs clear Heart RRR Abd soft + BS incision healing well no drainage noted small area of separation < 1/2 cm  Cleaned with H2O2  A/P Post op skin separation  No evidence of infection. Pt reassured. Pt instructed on cleaning wound twice a day with H2O2 Motrin as needed for pain. Pt declined contraception. F/U PRN

## 2018-03-16 NOTE — Progress Notes (Signed)
Pt c/o brown incision drainage. C/S 02/07/18.

## 2018-03-23 ENCOUNTER — Ambulatory Visit: Payer: Medicaid Other | Admitting: Obstetrics & Gynecology

## 2018-06-26 ENCOUNTER — Other Ambulatory Visit: Payer: Self-pay

## 2018-06-26 ENCOUNTER — Ambulatory Visit (HOSPITAL_COMMUNITY)
Admission: EM | Admit: 2018-06-26 | Discharge: 2018-06-26 | Disposition: A | Discharge status: Home / Self Care | Payer: Medicaid Other | Attending: Family Medicine | Admitting: Family Medicine

## 2018-06-26 ENCOUNTER — Encounter (HOSPITAL_COMMUNITY): Payer: Self-pay | Admitting: Emergency Medicine

## 2018-06-26 DIAGNOSIS — Z792 Long term (current) use of antibiotics: Secondary | ICD-10-CM | POA: Insufficient documentation

## 2018-06-26 DIAGNOSIS — Z8049 Family history of malignant neoplasm of other genital organs: Secondary | ICD-10-CM | POA: Insufficient documentation

## 2018-06-26 DIAGNOSIS — Z8249 Family history of ischemic heart disease and other diseases of the circulatory system: Secondary | ICD-10-CM | POA: Insufficient documentation

## 2018-06-26 DIAGNOSIS — Z806 Family history of leukemia: Secondary | ICD-10-CM | POA: Diagnosis not present

## 2018-06-26 DIAGNOSIS — Z803 Family history of malignant neoplasm of breast: Secondary | ICD-10-CM | POA: Diagnosis not present

## 2018-06-26 DIAGNOSIS — Z79899 Other long term (current) drug therapy: Secondary | ICD-10-CM | POA: Insufficient documentation

## 2018-06-26 DIAGNOSIS — Z202 Contact with and (suspected) exposure to infections with a predominantly sexual mode of transmission: Secondary | ICD-10-CM | POA: Diagnosis not present

## 2018-06-26 DIAGNOSIS — Z9889 Other specified postprocedural states: Secondary | ICD-10-CM | POA: Diagnosis not present

## 2018-06-26 DIAGNOSIS — Z113 Encounter for screening for infections with a predominantly sexual mode of transmission: Secondary | ICD-10-CM | POA: Diagnosis not present

## 2018-06-26 DIAGNOSIS — Z87891 Personal history of nicotine dependence: Secondary | ICD-10-CM | POA: Insufficient documentation

## 2018-06-26 DIAGNOSIS — Z833 Family history of diabetes mellitus: Secondary | ICD-10-CM | POA: Insufficient documentation

## 2018-06-26 DIAGNOSIS — Z711 Person with feared health complaint in whom no diagnosis is made: Secondary | ICD-10-CM

## 2018-06-26 MED ORDER — CEFTRIAXONE SODIUM 250 MG IJ SOLR
250.0000 mg | Freq: Once | INTRAMUSCULAR | Status: AC
  Administered 2018-06-26: 250 mg via INTRAMUSCULAR

## 2018-06-26 MED ORDER — LIDOCAINE HCL (PF) 1 % IJ SOLN
INTRAMUSCULAR | Status: AC
  Filled 2018-06-26: qty 2

## 2018-06-26 MED ORDER — FLUCONAZOLE 150 MG PO TABS: 150.0000 mg | ORAL_TABLET | Freq: Every day | ORAL | 0 refills | Status: DC

## 2018-06-26 MED ORDER — AZITHROMYCIN 250 MG PO TABS
ORAL_TABLET | ORAL | Status: AC
  Filled 2018-06-26: qty 4

## 2018-06-26 MED ORDER — AZITHROMYCIN 250 MG PO TABS
1000.0000 mg | ORAL_TABLET | Freq: Once | ORAL | Status: AC
  Administered 2018-06-26: 1000 mg via ORAL

## 2018-06-26 MED ORDER — CEFTRIAXONE SODIUM 250 MG IJ SOLR
INTRAMUSCULAR | Status: AC
  Filled 2018-06-26: qty 250

## 2018-06-26 MED ORDER — METRONIDAZOLE 500 MG PO TABS: 500.0000 mg | ORAL_TABLET | Freq: Two times a day (BID) | ORAL | 0 refills | Status: DC

## 2018-06-26 NOTE — ED Notes (Signed)
Sent for a dirty urine 

## 2018-06-26 NOTE — ED Triage Notes (Signed)
patient's female partner reports he has an std.  Patient requesting std check.  Denies vaginal discharge.  Patient did have sex this morning.

## 2018-06-26 NOTE — Discharge Instructions (Signed)
You were treated empirically for gonorrhea, chlamydia, trichomonas, bacterial vaginitis, yeast.  Azithromycin 1g by mouth and Rocephin 250mg  injection given in office today.  Start Flagyl and Diflucan as directed.  Cytology sent, you will be contacted with any positive results that requires further treatment. Refrain from sexual activity and alcohol use for the next 7 days. Monitor for any worsening of symptoms, fever, abdominal pain, nausea, vomiting, to follow up for reevaluation.

## 2018-06-26 NOTE — ED Provider Notes (Signed)
[facility]    CSN: [id number] Arrival date & time: 06/26/18  1523     History   Chief Complaint Chief Complaint  Patient presents with  . SEXUALLY TRANSMITTED DISEASE    HPI Krystal Murray is a 27 y.o. female.   27 year old female comes in for STD testing.  States partner has penile discharge, and came in for testing.  She is asymptomatic.  Denies abdominal pain, nausea, vomiting.  Denies fever, chills, night sweats.  Denies urinary symptoms such as frequency, dysuria, hematuria.  Denies vaginal discharge, itching, pain.  LMP 06/14/2018.  Sexually active with one female partner, no condom use.  Not breast-feeding.       Past Medical History:  Diagnosis Date  . No pertinent past medical history   . UTI (lower urinary tract infection)     Patient Active Problem List   Diagnosis Date Noted  . Postoperative infection, initial encounter 02/25/2018  . 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 Status post C-section 02/07/2018    Past Surgical History:  Procedure Laterality Date  . CESAREAN SECTION N/A 02/07/2018   Procedure: CESAREAN SECTION;  Surgeon: Hermina Staggers, MD;  Location: [facility] BIRTHING SUITES;  Service: Obstetrics;  Laterality: N/A;  . NO PAST SURGERIES      OB History    Gravida  5   Para  3   Term  3   Preterm  0   AB  2   Living  3     SAB  1   TAB  1   Ectopic  0   Multiple  0   Live Births  3            Home Medications    Prior to Admission medications   Medication Sig Start Date End Date Taking? Authorizing Provider  fluconazole (DIFLUCAN) 150 MG tablet Take 1 tablet (150 mg total) by mouth daily. Take second dose 72 hours later if symptoms still persists. 06/26/18   Cathie Hoops, Amy V, PA-C  metroNIDAZOLE (FLAGYL) 500 MG tablet Take 1 tablet (500 mg total) by mouth 2 (two) times daily. 06/26/18   Belinda Fisher, PA-C    Family History Family History  Problem Relation Age of Onset  . Cancer Maternal Aunt    uterine  . Diabetes Maternal Aunt   . Hypertension Maternal Aunt   . Cancer Maternal Grandmother        breastt  . Cancer Cousin        leaukemia    Social History Social History   Tobacco Use  . Smoking status: Former Smoker    Last attempt to quit: 05/2017    Years since quitting: 1.0  . Smokeless tobacco: Never Used  Substance Use Topics  . Alcohol use: No  . Drug use: No     Allergies   Patient has no known allergies.   Review of Systems Review of Systems  Reason unable to perform ROS: See HPI as above.     Physical Exam Triage Vital Signs ED Triage Vitals  Enc Vitals Group     BP 06/26/18 1535 128/72     Pulse Rate 06/26/18 1535 85     Resp 06/26/18 1535 18     Temp 06/26/18 1535 98.6 F (37 C)     Temp Source 06/26/18 1535 Oral     SpO2 06/26/18 1535 100 %     Weight --      Height --  Head Circumference --      Peak Flow --      Pain Score 06/26/18 1533 0     Pain Loc --      Pain Edu? --      Excl. in GC? --    No data found.  Updated Vital Signs BP 128/72 (BP Location: Right Arm) Comment (BP Location): large cuff  Pulse 85   Temp 98.6 F (37 C) (Oral)   Resp 18   LMP 06/14/2018   SpO2 100%   Physical Exam  Constitutional: She is oriented to person, place, and time. She appears well-developed and well-nourished. No distress.  HENT:  Head: Normocephalic and atraumatic.  Eyes: Pupils are equal, round, and reactive to light. Conjunctivae are normal.  Cardiovascular: Normal rate, regular rhythm and normal heart sounds. Exam reveals no gallop and no friction rub.  No murmur heard. Pulmonary/Chest: Effort normal and breath sounds normal. She has no wheezes. She has no rales.  Abdominal: Soft. Bowel sounds are normal. She exhibits no mass. There is no tenderness. There is no rebound, no guarding and no CVA tenderness.  Neurological: She is alert and oriented to person, place, and time.  Skin: Skin is warm and dry.  Psychiatric: She has a  normal mood and affect. Her behavior is normal. Judgment normal.     UC Treatments / Results  Labs (all labs ordered are listed, but only abnormal results are displayed) Labs Reviewed  URINE CYTOLOGY ANCILLARY ONLY    EKG None  Radiology No results found.  Procedures Procedures (including critical care time)  Medications Ordered in UC Medications  azithromycin (ZITHROMAX) tablet 1,000 mg (1,000 mg Oral Given 06/26/18 1623)  cefTRIAXone (ROCEPHIN) injection 250 mg (250 mg Intramuscular Given 06/26/18 1623)    Initial Impression / Assessment and Plan / UC Course  I have reviewed the triage vital signs and the nursing notes.  Pertinent labs & imaging results that were available during my care of the patient were reviewed by me and considered in my medical decision making (see chart for details).    Patient wants to be treated empirically for "everything". Azithromycin and Rocephin given in office today.  Flagyl and Diflucan as directed.  Cytology sent, patient will be contacted with any positive results that require additional treatment. Patient to refrain from sexual activity for the next 7 days. Return precautions given.   Final Clinical Impressions(s) / UC Diagnoses   Final diagnoses:  Concern about STD in female without diagnosis    ED Prescriptions    Medication Sig Dispense Auth. Provider   metroNIDAZOLE (FLAGYL) 500 MG tablet Take 1 tablet (500 mg total) by mouth 2 (two) times daily. 14 tablet Yu, Amy V, PA-C   fluconazole (DIFLUCAN) 150 MG tablet Take 1 tablet (150 mg total) by mouth daily. Take second dose 72 hours later if symptoms still persists. 2 tablet Threasa AlphaYu, Amy V, PA-C        Yu, Amy V, PA-C 06/26/18 1906

## 2018-06-27 ENCOUNTER — Telehealth (HOSPITAL_COMMUNITY): Payer: Self-pay

## 2018-06-27 LAB — URINE CYTOLOGY ANCILLARY ONLY
CHLAMYDIA, DNA PROBE: NEGATIVE
NEISSERIA GONORRHEA: POSITIVE — AB
TRICH (WINDOWPATH): POSITIVE — AB

## 2018-06-27 NOTE — Telephone Encounter (Signed)
Test for gonorrhea was positive. This was treated at the urgent care visit with IM rocephin 250mg  and po zithromax 1g. Pt called regarding test results, instructed patient to refrain from sexual intercourse for 7 days after treatment to give the medicine time to work. Sexual partners need to be notified and tested/treated. Condoms may reduce risk of reinfection. Recheck or followup with PCP for further evaluation if symptoms are not improving. Answered all patient questions. [facility] notified.   Trichomonas is positive. Rx metronidazole was given at the urgent care visit. Pt contacted and made aware, educated to please refrain from sexual intercourse for 7 days to give the medicine time to work. Sexual partners need to be notified and tested/treated. Condoms may reduce risk of reinfection. Recheck for further evaluation if symptoms are not improving. Answered all questions.

## 2018-06-28 LAB — URINE CYTOLOGY ANCILLARY ONLY: CANDIDA VAGINITIS: NEGATIVE

## 2018-06-30 ENCOUNTER — Telehealth (HOSPITAL_COMMUNITY): Payer: Self-pay

## 2018-06-30 NOTE — Telephone Encounter (Signed)
Bacterial Vaginosis test is positive.  Prescription for metronidazole was given at the urgent care visit. Attempted to reach patient no answer at this time.

## 2018-07-18 IMAGING — US US OB TRANSVAGINAL
1 series · 13 of 28 positions shown · non-contrast
Comparison: Pelvic ultrasound performed 06/03/2017

CLINICAL DATA: Acute onset of lower abdominal pain, nausea and
vomiting. Initial encounter.

EXAM:
OBSTETRIC <14 WK US AND TRANSVAGINAL OB US
TECHNIQUE: Both transabdominal and transvaginal ultrasound examinations were
performed for complete evaluation of the gestation as well as the
maternal uterus, adnexal regions, and pelvic cul-de-sac.
Transvaginal technique was performed to assess early pregnancy.

[Series 1: us ob transvaginal · 0.12mm/px · 13 of 104 slices shown]
[im 4/104]
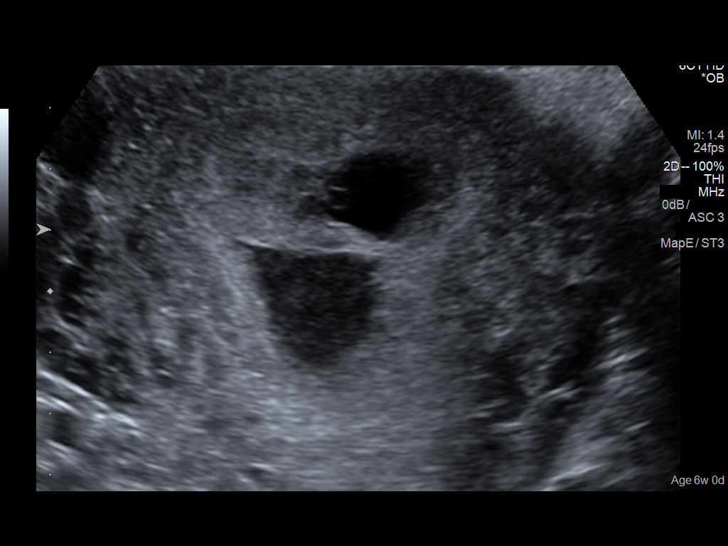
[im 12/104]
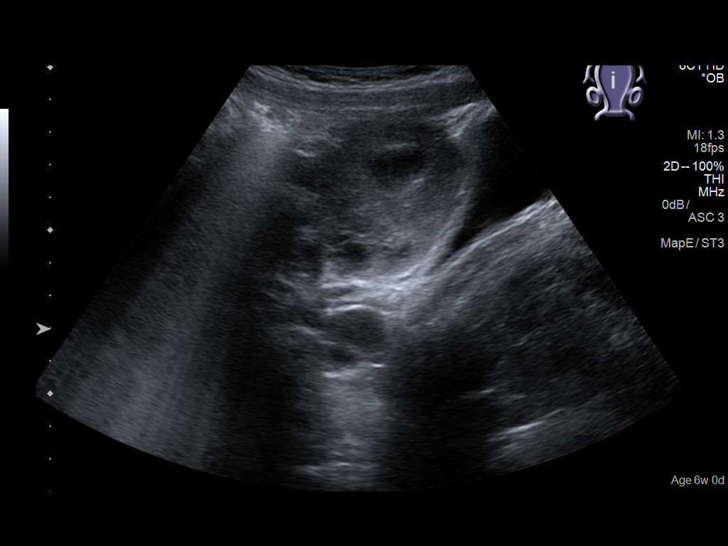
[im 20/104]
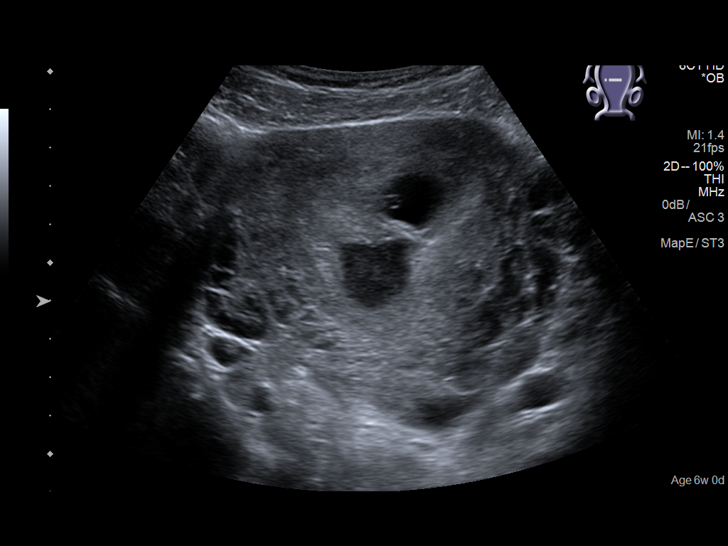
[im 27/104]
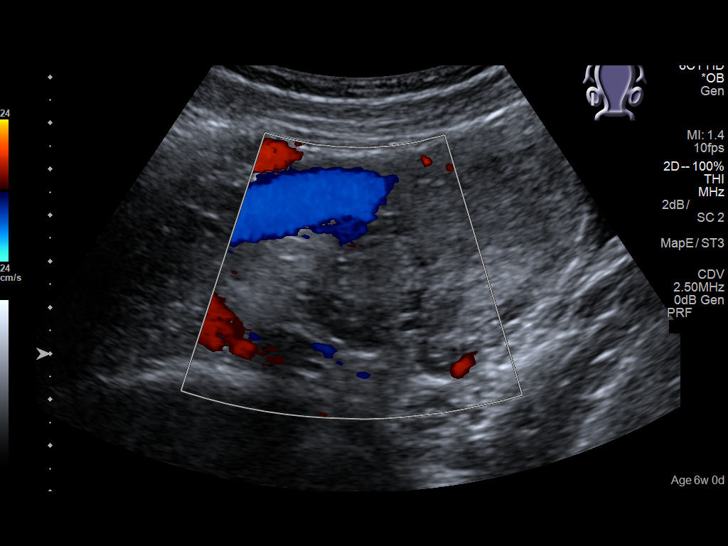
[im 35/104]
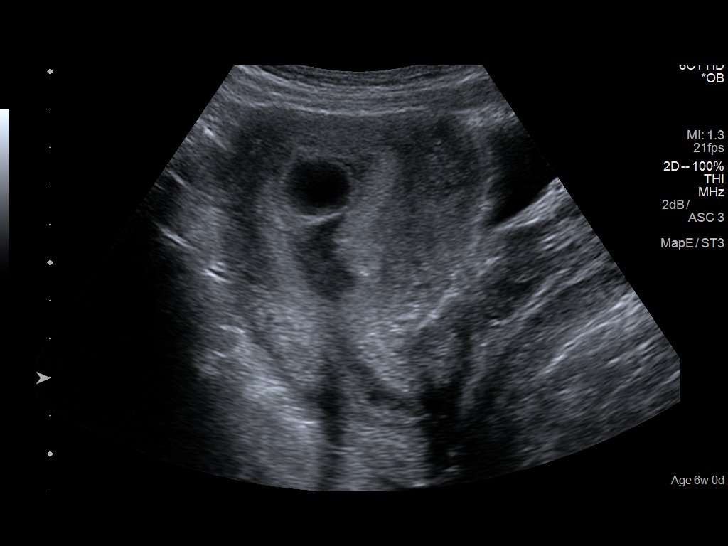
[im 42/104]
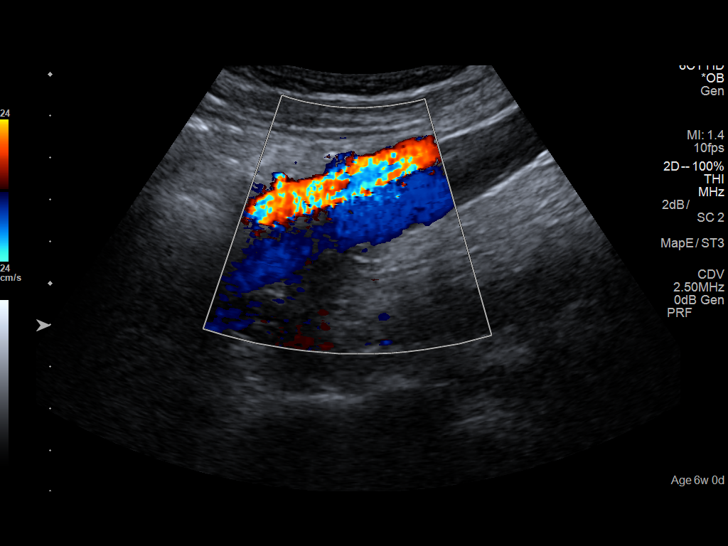
[im 54/104]
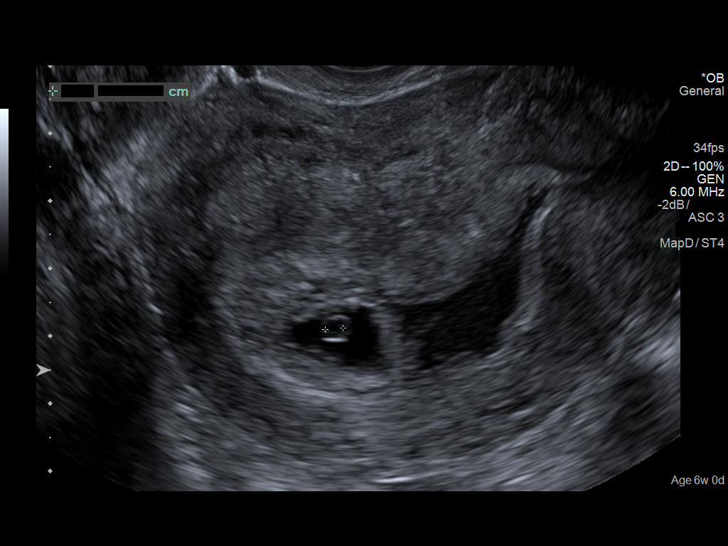
[im 62/104]
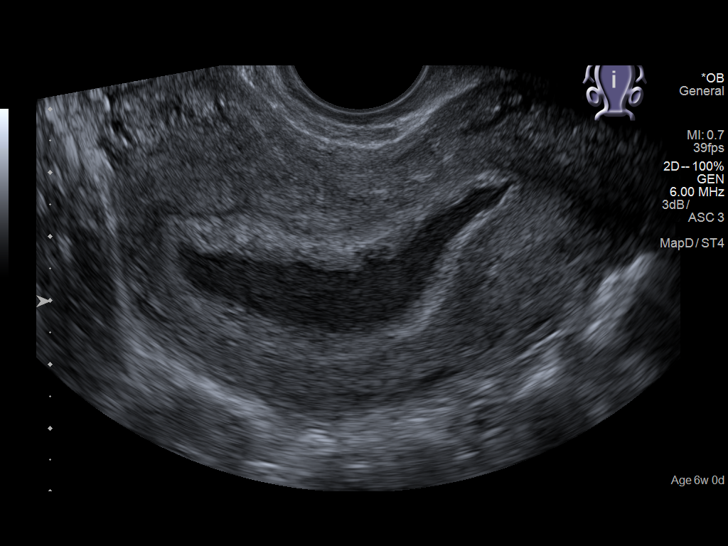
[im 69/104]
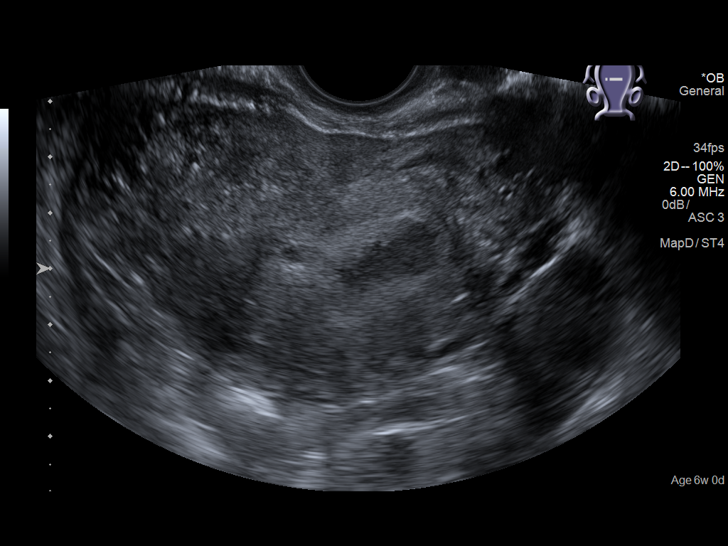
[im 77/104]
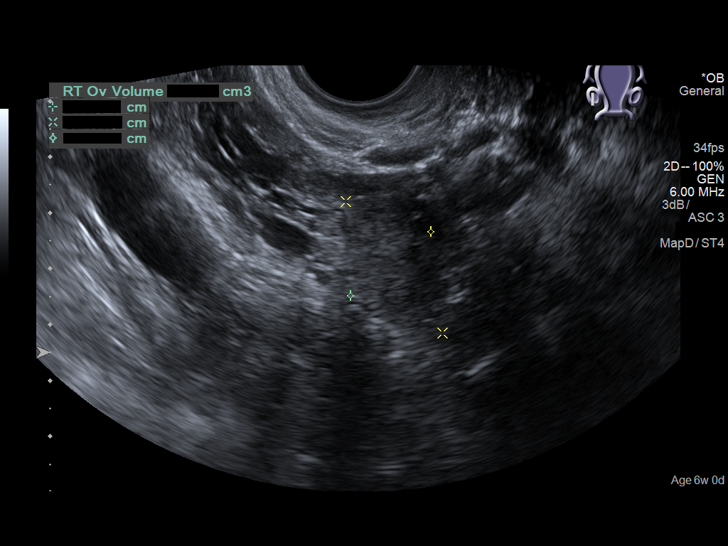
[im 84/104]
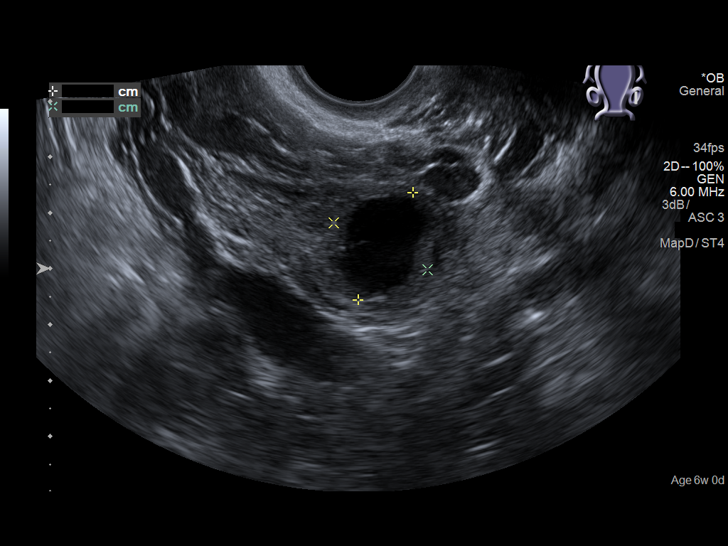
[im 92/104]
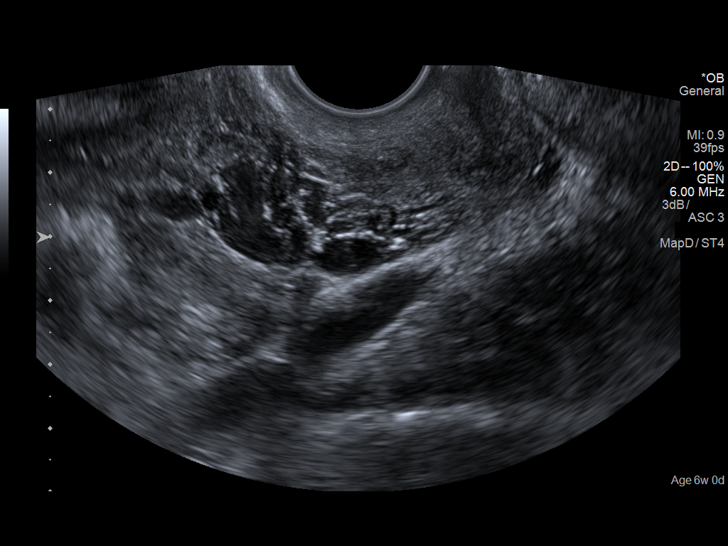
[im 100/104]
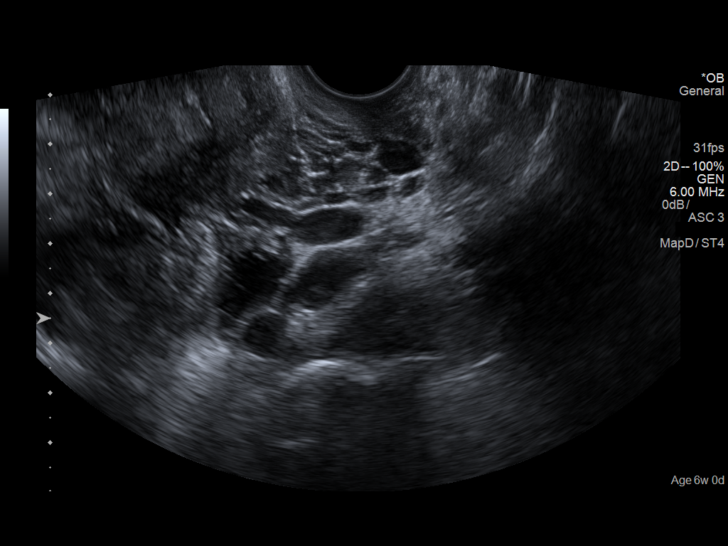

[13 of 28 positions shown; findings below may reference images not displayed]

FINDINGS: Intrauterine gestational sac: Single; visualized and normal in
shape.

Yolk sac:  Yes

Embryo:  Yes

Cardiac Activity: Yes

Heart Rate: 121  bpm

CRL:  4.1 mm   6 w   1 d                  US EDC: 01/31/2018

Subchorionic hemorrhage: A large amount of subchorionic hemorrhage
is noted.

Maternal uterus/adnexae: The ovaries are unremarkable in appearance.
The right ovary measures 2.9 x 2.0 x 1.8 cm, while the left ovary
measures 3.9 x 2.4 x 2.4 cm. No suspicious adnexal masses are seen;
there is no evidence for ovarian torsion.

Trace free fluid is seen within the pelvic cul-de-sac.
IMPRESSION: 1. Single live intrauterine pregnancy noted, with a crown-rump
length of 4 mm, corresponding to a gestational age of 6 weeks 1 day.
This matches the gestational age of 6 weeks 0 days by LMP,
reflecting an estimated date of delivery February 01, 2018.
2. Large amount of subchorionic hemorrhage noted.
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
## 2019-01-06 ENCOUNTER — Encounter (HOSPITAL_COMMUNITY): Payer: Self-pay | Admitting: Emergency Medicine

## 2019-01-06 ENCOUNTER — Ambulatory Visit (HOSPITAL_COMMUNITY)
Admission: EM | Admit: 2019-01-06 | Discharge: 2019-01-06 | Disposition: A | Discharge status: Home / Self Care | Payer: Medicaid Other | Attending: Internal Medicine | Admitting: Internal Medicine

## 2019-01-06 DIAGNOSIS — M25511 Pain in right shoulder: Secondary | ICD-10-CM

## 2019-01-06 DIAGNOSIS — M7918 Myalgia, other site: Secondary | ICD-10-CM

## 2019-01-06 DIAGNOSIS — M5489 Other dorsalgia: Secondary | ICD-10-CM

## 2019-01-06 DIAGNOSIS — W19XXXA Unspecified fall, initial encounter: Secondary | ICD-10-CM

## 2019-01-06 DIAGNOSIS — M25551 Pain in right hip: Secondary | ICD-10-CM | POA: Diagnosis not present

## 2019-01-06 DIAGNOSIS — W010XXA Fall on same level from slipping, tripping and stumbling without subsequent striking against object, initial encounter: Secondary | ICD-10-CM

## 2019-01-06 DIAGNOSIS — Y92009 Unspecified place in unspecified non-institutional (private) residence as the place of occurrence of the external cause: Secondary | ICD-10-CM

## 2019-01-06 MED ORDER — CYCLOBENZAPRINE HCL 10 MG PO TABS: 10.0000 mg | ORAL_TABLET | Freq: Two times a day (BID) | ORAL | 0 refills | Status: DC | PRN

## 2019-01-06 MED ORDER — OXYCODONE-ACETAMINOPHEN 5-325 MG PO TABS: 2.0000 | ORAL_TABLET | ORAL | 0 refills | Status: DC | PRN

## 2019-01-06 MED ORDER — MELOXICAM 7.5 MG PO TABS: 7.5000 mg | ORAL_TABLET | Freq: Two times a day (BID) | ORAL | 0 refills | Status: AC | PRN

## 2019-01-06 NOTE — ED Provider Notes (Signed)
[facility]    CSN: [id number] Arrival date & time: 01/06/19  1711     History   Chief Complaint Chief Complaint  Patient presents with  . Fall    HPI Krystal Murray is a 28 y.o. female.   28 year old female with no chronic medical problems presents to urgent care complaining of pain all over.  The patient had a fall when she slipped on a wet floor at her home 3 days ago.  She landed on hard tile floor primarily on her right side but states that her mid back as well as right shoulder and hip are sore and nearly every direction she moves.     Past Medical History:  Diagnosis Date  . No pertinent past medical history   . UTI (lower urinary tract infection)     Patient Active Problem List   Diagnosis Date Noted  . Postoperative infection, initial encounter 02/25/2018  . 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 Status post C-section 02/07/2018    Past Surgical History:  Procedure Laterality Date  . CESAREAN SECTION N/A 02/07/2018   Procedure: CESAREAN SECTION;  Surgeon: Hermina Staggers, MD;  Location: [facility] BIRTHING SUITES;  Service: Obstetrics;  Laterality: N/A;  . NO PAST SURGERIES      OB History    Gravida  5   Para  3   Term  3   Preterm  0   AB  2   Living  3     SAB  1   TAB  1   Ectopic  0   Multiple  0   Live Births  3            Home Medications    Prior to Admission medications   Medication Sig Start Date End Date Taking? Authorizing Provider  cyclobenzaprine (FLEXERIL) 10 MG tablet Take 1 tablet (10 mg total) by mouth 2 (two) times daily as needed for muscle spasms. 01/06/19   Arnaldo Natal, MD  meloxicam (MOBIC) 7.5 MG tablet Take 1 tablet (7.5 mg total) by mouth 2 (two) times daily as needed for up to 14 days for pain. Do NOT take on an empty stomach.  Drink plenty of water 01/06/19 01/20/19  Arnaldo Natal, MD  oxyCODONE-acetaminophen (PERCOCET/ROXICET) 5-325 MG tablet Take 2 tablets by mouth  every 4 (four) hours as needed for severe pain. 01/06/19   Arnaldo Natal, MD    Family History Family History  Problem Relation Age of Onset  . Cancer Maternal Aunt        uterine  . Diabetes Maternal Aunt   . Hypertension Maternal Aunt   . Cancer Maternal Grandmother        breastt  . Cancer Cousin        leaukemia    Social History Social History   Tobacco Use  . Smoking status: Former Smoker    Last attempt to quit: 05/2017    Years since quitting: 1.6  . Smokeless tobacco: Never Used  Substance Use Topics  . Alcohol use: No  . Drug use: No     Allergies   Patient has no known allergies.   Review of Systems Review of Systems  Constitutional: Negative for chills and fever.  HENT: Negative for sore throat and tinnitus.   Eyes: Negative for redness.  Respiratory: Negative for cough and shortness of breath.   Cardiovascular: Negative for chest pain and palpitations.  Gastrointestinal: Negative for abdominal pain, diarrhea,  nausea and vomiting.  Genitourinary: Negative for dysuria, frequency and urgency.  Musculoskeletal: Positive for arthralgias. Negative for myalgias.  Skin: Negative for rash.       No lesions  Neurological: Negative for weakness.  Hematological: Does not bruise/bleed easily.  Psychiatric/Behavioral: Negative for suicidal ideas.     Physical Exam Triage Vital Signs ED Triage Vitals  Enc Vitals Group     BP 01/06/19 1804 108/67     Pulse Rate 01/06/19 1804 73     Resp 01/06/19 1804 16     Temp 01/06/19 1804 97.8 F (36.6 C)     Temp src --      SpO2 01/06/19 1804 100 %     Weight --      Height --      Head Circumference --      Peak Flow --      Pain Score 01/06/19 1805 8     Pain Loc --      Pain Edu? --      Excl. in GC? --    No data found.  Updated Vital Signs BP 108/67   Pulse 73   Temp 97.8 F (36.6 C)   Resp 16   SpO2 100%   Breastfeeding No   Visual Acuity Right Eye Distance:   Left Eye Distance:     Bilateral Distance:    Right Eye Near:   Left Eye Near:    Bilateral Near:     Physical Exam Vitals signs and nursing note reviewed.  Constitutional:      General: She is not in acute distress.    Appearance: She is well-developed.  HENT:     Head: Normocephalic and atraumatic.  Eyes:     General: No scleral icterus.    Conjunctiva/sclera: Conjunctivae normal.     Pupils: Pupils are equal, round, and reactive to light.  Neck:     Musculoskeletal: Normal range of motion and neck supple.     Thyroid: No thyromegaly.     Vascular: No JVD.     Trachea: No tracheal deviation.  Cardiovascular:     Rate and Rhythm: Normal rate and regular rhythm.     Heart sounds: Normal heart sounds. No murmur. No friction rub. No gallop.   Pulmonary:     Effort: Pulmonary effort is normal.     Breath sounds: Normal breath sounds.  Abdominal:     General: Bowel sounds are normal. There is no distension.     Palpations: Abdomen is soft.     Tenderness: There is no abdominal tenderness.  Musculoskeletal: Normal range of motion.        General: Tenderness present.  Lymphadenopathy:     Cervical: No cervical adenopathy.  Skin:    General: Skin is warm and dry.  Neurological:     Mental Status: She is alert and oriented to person, place, and time.     Cranial Nerves: No cranial nerve deficit.  Psychiatric:        Behavior: Behavior normal.        Thought Content: Thought content normal.        Judgment: Judgment normal.      UC Treatments / Results  Labs (all labs ordered are listed, but only abnormal results are displayed) Labs Reviewed - No data to display  EKG None  Radiology No results found.  Procedures Procedures (including critical care time)  Medications Ordered in UC Medications - No data to display  Initial Impression /  Assessment and Plan / UC Course  I have reviewed the triage vital signs and the nursing notes.  Pertinent labs & imaging results that were  available during my care of the patient were reviewed by me and considered in my medical decision making (see chart for details).     Generalized musculoskeletal pain.  No specific areas or bony prominences concerning for fracture.  Thus no imaging.  Anti-inflammatory medication as well as muscle relaxant with limited amount of Percocet for pain. Final Clinical Impressions(s) / UC Diagnoses   Final diagnoses:  Fall in home, initial encounter  Musculoskeletal pain   Discharge Instructions   None    ED Prescriptions    Medication Sig Dispense Auth. Provider   meloxicam (MOBIC) 7.5 MG tablet Take 1 tablet (7.5 mg total) by mouth 2 (two) times daily as needed for up to 14 days for pain. Do NOT take on an empty stomach.  Drink plenty of water 28 tablet Arnaldo Natal, MD   cyclobenzaprine (FLEXERIL) 10 MG tablet Take 1 tablet (10 mg total) by mouth 2 (two) times daily as needed for muscle spasms. 20 tablet Arnaldo Natal, MD   oxyCODONE-acetaminophen (PERCOCET/ROXICET) 5-325 MG tablet Take 2 tablets by mouth every 4 (four) hours as needed for severe pain. 15 tablet Arnaldo Natal, MD     Controlled Substance Prescriptions [place] Controlled Substance Registry consulted? No   Arnaldo Natal, MD 01/06/19 2039

## 2019-01-06 NOTE — ED Triage Notes (Signed)
Pt states she fell 3 days ago. Pt states she fell down the stairs and is c/o hip pain and back pain and shoulder pain.

## 2019-02-01 ENCOUNTER — Ambulatory Visit (HOSPITAL_COMMUNITY)
Admission: EM | Admit: 2019-02-01 | Discharge: 2019-02-01 | Disposition: A | Discharge status: Home / Self Care | Payer: Medicaid Other | Attending: Internal Medicine | Admitting: Internal Medicine

## 2019-02-01 ENCOUNTER — Encounter (HOSPITAL_COMMUNITY): Payer: Self-pay

## 2019-02-01 DIAGNOSIS — M62830 Muscle spasm of back: Secondary | ICD-10-CM | POA: Diagnosis present

## 2019-02-01 DIAGNOSIS — R102 Pelvic and perineal pain: Secondary | ICD-10-CM | POA: Diagnosis present

## 2019-02-01 LAB — POCT URINALYSIS DIP (DEVICE)
BILIRUBIN URINE: NEGATIVE
Glucose, UA: NEGATIVE mg/dL
Nitrite: NEGATIVE
Protein, ur: NEGATIVE mg/dL
SPECIFIC GRAVITY, URINE: 1.025 (ref 1.005–1.030)
Urobilinogen, UA: 0.2 mg/dL (ref 0.0–1.0)
pH: 6.5 (ref 5.0–8.0)

## 2019-02-01 MED ORDER — NITROFURANTOIN MONOHYD MACRO 100 MG PO CAPS: 100.0000 mg | ORAL_CAPSULE | Freq: Two times a day (BID) | ORAL | 0 refills | Status: DC

## 2019-02-01 NOTE — ED Provider Notes (Addendum)
[facility]    CSN: [id number] Arrival date & time: 02/01/19  1929     History   Chief Complaint Chief Complaint  Patient presents with  . Back Pain    Right Side    HPI Krystal Murray is a 28 y.o. female. who slept on a different bed last night which was soft and woke up with R lower thorax pain. Pain is provoked with palpation and movement of her spine. She  Was seen for a fall 3 weeks ago and was placed on Oxy, flexeril and Mobic, but she only takes it prn. She did not take any Flexeril or Mobic today.  2- She also  Been having suprapubic pain but denies dysuria or abnormal vaginal discharge or odor. She also states she did not finish the Flagyl from July last year and feels she is not normal" down there". She has the same partner since last STD test, but she cant trust him.  LMP 01/22/19    Past Medical History:  Diagnosis Date  . No pertinent past medical history   . UTI (lower urinary tract infection)     Patient Active Problem List   Diagnosis Date Noted  . Postoperative infection, initial encounter 02/25/2018  . 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 Status post C-section 02/07/2018    Past Surgical History:  Procedure Laterality Date  . CESAREAN SECTION N/A 02/07/2018   Procedure: CESAREAN SECTION;  Surgeon: Hermina Staggers, MD;  Location: [facility] BIRTHING SUITES;  Service: Obstetrics;  Laterality: N/A;  . NO PAST SURGERIES      OB History    Gravida  5   Para  3   Term  3   Preterm  0   AB  2   Living  3     SAB  1   TAB  1   Ectopic  0   Multiple  0   Live Births  3            Home Medications    Prior to Admission medications   Medication Sig Start Date End Date Taking? Authorizing Provider  cyclobenzaprine (FLEXERIL) 10 MG tablet Take 1 tablet (10 mg total) by mouth 2 (two) times daily as needed for muscle spasms. 01/06/19   Arnaldo Natal, MD  oxyCODONE-acetaminophen (PERCOCET/ROXICET) 5-325 MG  tablet Take 2 tablets by mouth every 4 (four) hours as needed for severe pain. 01/06/19   Arnaldo Natal, MD    Family History Family History  Problem Relation Age of Onset  . Cancer Maternal Aunt        uterine  . Diabetes Maternal Aunt   . Hypertension Maternal Aunt   . Cancer Maternal Grandmother        breastt  . Cancer Cousin        leaukemia    Social History Social History   Tobacco Use  . Smoking status: Former Smoker    Last attempt to quit: 05/2017    Years since quitting: 1.7  . Smokeless tobacco: Never Used  Substance Use Topics  . Alcohol use: No  . Drug use: No     Allergies   Patient has no known allergies.   Review of Systems Review of Systems  Constitutional: Negative for chills, diaphoresis and fever.  Gastrointestinal: Positive for abdominal pain. Negative for abdominal distention, constipation, diarrhea, nausea and vomiting.  Genitourinary: Negative for dysuria, enuresis, flank pain, frequency, urgency and vaginal discharge.  Musculoskeletal: Positive  for back pain and myalgias. Negative for arthralgias and gait problem.  Skin: Negative for rash and wound.   Physical Exam Triage Vital Signs ED Triage Vitals  Enc Vitals Group     BP 02/01/19 2027 120/70     Pulse Rate 02/01/19 2027 66     Resp 02/01/19 2027 20     Temp 02/01/19 2027 98.2 F (36.8 C)     Temp Source 02/01/19 2027 Oral     SpO2 02/01/19 2027 100 %     Weight --      Height --      Head Circumference --      Peak Flow --      Pain Score 02/01/19 2029 8     Pain Loc --      Pain Edu? --      Excl. in GC? --    No data found.  Updated Vital Signs BP 120/70 (BP Location: Right Arm)   Pulse 66   Temp 98.2 F (36.8 C) (Oral)   Resp 20   SpO2 100%   Visual Acuity Right Eye Distance:   Left Eye Distance:   Bilateral Distance:    Right Eye Near:   Left Eye Near:    Bilateral Near:     Physical Exam Vitals signs and nursing note reviewed.  Constitutional:        General: She is not in acute distress.    Appearance: Normal appearance.  HENT:     Head: Normocephalic.     Nose: Nose normal.  Eyes:     General: No scleral icterus.    Conjunctiva/sclera: Conjunctivae normal.  Neck:     Musculoskeletal: Neck supple.  Pulmonary:     Effort: Pulmonary effort is normal.     Breath sounds: Normal breath sounds.  Abdominal:     General: Bowel sounds are normal. There is no distension.     Palpations: Abdomen is soft. There is no mass.     Tenderness: There is abdominal tenderness. There is right CVA tenderness. There is no left CVA tenderness, guarding or rebound.     Comments: Has mild tenderness on suprapubic area and RLQ  Musculoskeletal: Normal range of motion.  Skin:    General: Skin is warm and dry.  Neurological:     Mental Status: She is alert and oriented to person, place, and time.     Motor: No weakness.     Gait: Gait normal.     Deep Tendon Reflexes: Reflexes normal.  Psychiatric:        Mood and Affect: Mood normal.        Behavior: Behavior normal.        Thought Content: Thought content normal.        Judgment: Judgment normal.    UC Treatments / Results  Labs (all labs ordered are listed, but only abnormal results are displayed) Labs Reviewed  POCT URINALYSIS DIP (DEVICE)    EKG None  Radiology No results found.  Procedures Procedures   Medications Ordered in UC Medications - No data to display  Initial Impression / Assessment and Plan / UC Course  I have reviewed the triage vital signs and the nursing notes. Pertinent labs  results that were available during my care of the patient were reviewed by me and considered in my medical decision making (see chart for details). She was advised to get back on the Flexeril and Mobic x 5 days. Also needs to use ice on  area of pain for 48h 15 min at a time 2-4 times a day and then after that alternate with heat and  Do stretches I showed her today.  Urine STD was  ordered and she was explained that needs to wait for results to treat. She insisted that she does not have an active medicaid card and did not have the money to pick up medication, and I explained to her, we do not have medication to give her from her for UTI or BV/ or Trich.    Final Clinical Impressions(s) / UC Diagnoses   Final diagnoses:  None   Discharge Instructions   None    ED Prescriptions    None     Controlled Substance Prescriptions [place] Controlled Substance Registry consulted?    Garey Ham, Cordelia Poche 02/01/19 2143    Rodriguez-[place], [place], PA-C 02/01/19 2143

## 2019-02-01 NOTE — ED Triage Notes (Signed)
Pt presents with back pain on right side , abdominal pain and headache she believes to be from a fall 3 weeks ago when she fell and landed directly on her back.

## 2019-02-01 NOTE — ED Notes (Signed)
CALLED LAB TO ADD ON CYTOLOGY

## 2019-02-01 NOTE — Discharge Instructions (Addendum)
use ice on area of pain for 48h 15 min at a time 2-4 times a day and then after that alternate with heat and  Do stretches I showed her today.   Your urine has some infection cells, so I will start you on antibiotics. The urine culture will confirm if there is a true infection or not.

## 2019-02-02 LAB — URINE CYTOLOGY ANCILLARY ONLY
CHLAMYDIA, DNA PROBE: NEGATIVE
Neisseria Gonorrhea: NEGATIVE
Trichomonas: POSITIVE — AB

## 2019-02-03 ENCOUNTER — Telehealth (HOSPITAL_COMMUNITY): Payer: Self-pay | Admitting: Emergency Medicine

## 2019-02-03 LAB — URINE CULTURE: Special Requests: NORMAL

## 2019-02-03 MED ORDER — METRONIDAZOLE 500 MG PO TABS: 2000.0000 mg | ORAL_TABLET | Freq: Once | ORAL | 0 refills | Status: AC

## 2019-02-03 NOTE — Telephone Encounter (Signed)
Trichomonas is positive. Rx  for Flagyl 2 grams, once was sent to the pharmacy of record. Pt needs education to refrain from sexual intercourse for 7 days to give the medicine time to work. Sexual partners need to be notified and tested/treated. Condoms may reduce risk of reinfection. Recheck for further evaluation if symptoms are not improving.   Attempted to reach patient. No answer at this time. Call cannot be completed at this time.

## 2019-02-04 LAB — URINE CYTOLOGY ANCILLARY ONLY: Candida vaginitis: NEGATIVE

## 2019-02-06 ENCOUNTER — Telehealth (HOSPITAL_COMMUNITY): Payer: Self-pay | Admitting: Emergency Medicine

## 2019-02-06 MED ORDER — METRONIDAZOLE 500 MG PO TABS: 500.0000 mg | ORAL_TABLET | Freq: Two times a day (BID) | ORAL | 0 refills | Status: AC

## 2019-02-06 NOTE — Telephone Encounter (Signed)
Bacterial vaginosis is positive. This was not treated at the urgent care visit.  Flagyl 500 mg BID x 7 days #14 no refills sent to patients pharmacy of choice.    Attempted to reach patient x2. No answer at this time. Voicemail left.

## 2019-02-10 ENCOUNTER — Telehealth (HOSPITAL_COMMUNITY): Payer: Self-pay | Admitting: Emergency Medicine

## 2019-02-10 NOTE — Telephone Encounter (Signed)
Attempted to reach patient x3. No answer at this time. Call cannot be completed. . Letter sent    

## 2019-11-12 ENCOUNTER — Other Ambulatory Visit: Payer: Self-pay

## 2019-11-12 ENCOUNTER — Emergency Department (HOSPITAL_COMMUNITY)
Admission: EM | Admit: 2019-11-12 | Discharge: 2019-11-12 | Discharge status: Absent without leave | Payer: Medicaid Other

## 2019-11-12 NOTE — ED Notes (Signed)
Unable to locate for triage.

## 2019-11-12 NOTE — ED Notes (Signed)
Unable to locate in waiting room

## 2021-01-24 ENCOUNTER — Encounter (HOSPITAL_COMMUNITY): Payer: Self-pay

## 2021-01-24 ENCOUNTER — Inpatient Hospital Stay (HOSPITAL_COMMUNITY)
Admission: AD | Admit: 2021-01-24 | Discharge: 2021-01-24 | Disposition: A | Discharge status: Home / Self Care | Payer: Medicaid Other | Attending: Obstetrics & Gynecology | Admitting: Obstetrics & Gynecology

## 2021-01-24 DIAGNOSIS — R519 Headache, unspecified: Secondary | ICD-10-CM | POA: Diagnosis not present

## 2021-01-24 DIAGNOSIS — O99353 Diseases of the nervous system complicating pregnancy, third trimester: Secondary | ICD-10-CM | POA: Diagnosis not present

## 2021-01-24 DIAGNOSIS — Z3A38 38 weeks gestation of pregnancy: Secondary | ICD-10-CM | POA: Diagnosis not present

## 2021-01-24 DIAGNOSIS — O98813 Other maternal infectious and parasitic diseases complicating pregnancy, third trimester: Secondary | ICD-10-CM

## 2021-01-24 DIAGNOSIS — Z3689 Encounter for other specified antenatal screening: Secondary | ICD-10-CM

## 2021-01-24 DIAGNOSIS — O368120 Decreased fetal movements, second trimester, not applicable or unspecified: Secondary | ICD-10-CM

## 2021-01-24 DIAGNOSIS — Z0371 Encounter for suspected problem with amniotic cavity and membrane ruled out: Secondary | ICD-10-CM

## 2021-01-24 DIAGNOSIS — R030 Elevated blood-pressure reading, without diagnosis of hypertension: Secondary | ICD-10-CM | POA: Insufficient documentation

## 2021-01-24 DIAGNOSIS — O368130 Decreased fetal movements, third trimester, not applicable or unspecified: Secondary | ICD-10-CM

## 2021-01-24 DIAGNOSIS — G44209 Tension-type headache, unspecified, not intractable: Secondary | ICD-10-CM | POA: Insufficient documentation

## 2021-01-24 DIAGNOSIS — B373 Candidiasis of vulva and vagina: Secondary | ICD-10-CM | POA: Diagnosis not present

## 2021-01-24 DIAGNOSIS — O99891 Other specified diseases and conditions complicating pregnancy: Secondary | ICD-10-CM | POA: Diagnosis not present

## 2021-01-24 DIAGNOSIS — O26893 Other specified pregnancy related conditions, third trimester: Secondary | ICD-10-CM | POA: Insufficient documentation

## 2021-01-24 DIAGNOSIS — Z87891 Personal history of nicotine dependence: Secondary | ICD-10-CM | POA: Insufficient documentation

## 2021-01-24 DIAGNOSIS — B3731 Acute candidiasis of vulva and vagina: Secondary | ICD-10-CM

## 2021-01-24 LAB — URINALYSIS, ROUTINE W REFLEX MICROSCOPIC
Bilirubin Urine: NEGATIVE
Glucose, UA: NEGATIVE mg/dL
Hgb urine dipstick: NEGATIVE
Ketones, ur: NEGATIVE mg/dL
Nitrite: NEGATIVE
Protein, ur: NEGATIVE mg/dL
Specific Gravity, Urine: 1.014 (ref 1.005–1.030)
pH: 6 (ref 5.0–8.0)

## 2021-01-24 LAB — WET PREP, GENITAL
Clue Cells Wet Prep HPF POC: NONE SEEN
Sperm: NONE SEEN
Trich, Wet Prep: NONE SEEN

## 2021-01-24 LAB — COMPREHENSIVE METABOLIC PANEL
ALT: 8 U/L (ref 0–44)
AST: 16 U/L (ref 15–41)
Albumin: 2.6 g/dL — ABNORMAL LOW (ref 3.5–5.0)
Alkaline Phosphatase: 109 U/L (ref 38–126)
Anion gap: 9 (ref 5–15)
BUN: 7 mg/dL (ref 6–20)
CO2: 20 mmol/L — ABNORMAL LOW (ref 22–32)
Calcium: 8.9 mg/dL (ref 8.9–10.3)
Chloride: 106 mmol/L (ref 98–111)
Creatinine, Ser: 0.65 mg/dL (ref 0.44–1.00)
GFR, Estimated: >60 mL/min (ref >60)
Glucose, Bld: 74 mg/dL (ref 70–99)
Potassium: 3.6 mmol/L (ref 3.5–5.1)
Sodium: 135 mmol/L (ref 135–145)
Total Bilirubin: 0.8 mg/dL (ref 0.3–1.2)
Total Protein: 6.3 g/dL — ABNORMAL LOW (ref 6.5–8.1)

## 2021-01-24 LAB — CBC
HCT: 26.9 % — ABNORMAL LOW (ref 36.0–46.0)
Hemoglobin: 8.8 g/dL — ABNORMAL LOW (ref 12.0–15.0)
MCH: 27.8 pg (ref 26.0–34.0)
MCHC: 32.7 g/dL (ref 30.0–36.0)
MCV: 84.9 fL (ref 80.0–100.0)
Platelets: 185 10*3/uL (ref 150–400)
RBC: 3.17 MIL/uL — ABNORMAL LOW (ref 3.87–5.11)
RDW: 13.7 % (ref 11.5–15.5)
WBC: 13.2 10*3/uL — ABNORMAL HIGH (ref 4.0–10.5)
nRBC: 0 % (ref 0.0–0.2)

## 2021-01-24 LAB — PROTEIN / CREATININE RATIO, URINE
Creatinine, Urine: 133.57 mg/dL
Protein Creatinine Ratio: 0.13 mg/mg{Cre} (ref 0.00–0.15)
Total Protein, Urine: 17 mg/dL

## 2021-01-24 LAB — POCT FERN TEST: POCT Fern Test: NEGATIVE

## 2021-01-24 MED ORDER — TERCONAZOLE 0.8 % VA CREA: 1.0000 | TOPICAL_CREAM | Freq: Every day | VAGINAL | 0 refills | Status: DC

## 2021-01-24 MED ORDER — CYCLOBENZAPRINE HCL 5 MG PO TABS
10.0000 mg | ORAL_TABLET | Freq: Once | ORAL | Status: AC
  Administered 2021-01-24: 10 mg via ORAL
  Filled 2021-01-24: qty 2

## 2021-01-24 MED ORDER — ACETAMINOPHEN 325 MG PO TABS
650.0000 mg | ORAL_TABLET | Freq: Once | ORAL | Status: AC
  Administered 2021-01-24: 650 mg via ORAL
  Filled 2021-01-24: qty 2

## 2021-01-24 MED ORDER — CALCIUM CARBONATE ANTACID 500 MG PO CHEW
400.0000 mg | CHEWABLE_TABLET | Freq: Once | ORAL | Status: AC
  Administered 2021-01-24: 400 mg via ORAL
  Filled 2021-01-24: qty 2

## 2021-01-24 NOTE — MAU Note (Signed)
Patient reports HA that has been ongoing since this morning, hasn't taken anything for the pain.  States she attributes it to stress.  Also feels like she could be leaking colorless fluid since yesterday though it doesn't happen all the time (mostly when she has to use the bathroom or is overly active).  Denies feeling contractions, just back pains that are more intense w/ position changes.  Patient states she missed her last appointment.  GBS +.  Patient reports she has not felt any fetal movement all day until she got in our lobby.  (FHR 145).

## 2021-01-24 NOTE — MAU Provider Note (Addendum)
History     CSN: [id number]  Arrival date and time: 01/24/21 2620   Event Date/Time   First Provider Initiated Contact with Patient 01/24/21 2037      No chief complaint on file.  Krystal Murray is a 30 y.o. [id number] at [redacted]w[redacted]d who receives care at Dr. Shawnie Pons in [place].  She presents today for leaking of fluid, decreased fetal movement, and HA.  Patient reports her leaking started "earlier along in my pregnancy and just got a little heavier the last day or so... I am not sure." She states last night she reports she was leaking so much she thought she urinated on herself.  She denies smell or color to the fluid, but reports it is sometimes "creamy."  She endorses leaking upon arrival.  She reports DFM and contributes this to stress and decreased room for her fetus in utero.  She states movement is increased when her 29 year old is laying on her stomach.  She endorses movement since arrival.   She reports her HA is "pressure in my head and in my back."  She states it started this morning and she has not taken any medications for her symptoms.  She reports the pressure in her head is located in the front of her head and is a 8-9/10. She states prior to arrival it was a 7/10.  She reports she last ate around 5pm and did not help her headache.    OB History    Gravida  6   Para  3   Term  3   Preterm  0   AB  2   Living  3     SAB  1   IAB  1   Ectopic  0   Multiple  0   Live Births  3           Past Medical History:  Diagnosis Date  . No pertinent past medical history   . UTI (lower urinary tract infection)     Past Surgical History:  Procedure Laterality Date  . CESAREAN SECTION N/A 02/07/2018   Procedure: CESAREAN SECTION;  Surgeon: Hermina Staggers, MD;  Location: [facility] BIRTHING SUITES;  Service: Obstetrics;  Laterality: N/A;  . NO PAST SURGERIES      Family History  Problem Relation Age of Onset  . Cancer Maternal Aunt        uterine  . Diabetes Maternal Aunt   .  Hypertension Maternal Aunt   . Cancer Maternal Grandmother        breastt  . Cancer Cousin        leaukemia    Social History   Tobacco Use  . Smoking status: Former Smoker    Quit date: 05/2017    Years since quitting: 3.6  . Smokeless tobacco: Never Used  Vaping Use  . Vaping Use: Never used  Substance Use Topics  . Alcohol use: No  . Drug use: No    Allergies: No Known Allergies  Medications Prior to Admission  Medication Sig Dispense Refill Last Dose  . Prenatal Multivit-Min-Fe-FA (PRENATAL 1 + IRON PO) Take by mouth.   01/24/2021 at Unknown time  . cyclobenzaprine (FLEXERIL) 10 MG tablet Take 1 tablet (10 mg total) by mouth 2 (two) times daily as needed for muscle spasms. 20 tablet 0   . nitrofurantoin, macrocrystal-monohydrate, (MACROBID) 100 MG capsule Take 1 capsule (100 mg total) by mouth 2 (two) times daily. 10 capsule 0   . oxyCODONE-acetaminophen (  PERCOCET/ROXICET) 5-325 MG tablet Take 2 tablets by mouth every 4 (four) hours as needed for severe pain. 15 tablet 0     Review of Systems  Respiratory: Negative for cough and shortness of breath.   Gastrointestinal: Negative for abdominal pain, nausea and vomiting.  Genitourinary: Positive for vaginal discharge. Negative for difficulty urinating, dysuria and vaginal bleeding.  Musculoskeletal: Positive for back pain.  Neurological: Positive for dizziness, light-headedness and headaches.   Physical Exam   Blood pressure (!) 143/78, pulse 81, temperature 98.4 F (36.9 C), resp. rate 17, weight 107.2 kg, SpO2 100 %.    Vitals:   01/24/21 1946 01/24/21 2010 01/24/21 2034 01/24/21 2130  BP: 136/79 (!) 142/80 (!) 143/78 114/71  Pulse: 100 89 81 81  Resp: 19 17    Temp: 98.4 F (36.9 C)     SpO2:   100% 100%  Weight:         Physical Exam Vitals reviewed.  Constitutional:      Appearance: She is well-developed.  HENT:     Head: Normocephalic and atraumatic.  Cardiovascular:     Rate and Rhythm: Normal rate  and regular rhythm.     Heart sounds: Normal heart sounds.  Pulmonary:     Effort: Pulmonary effort is normal. No respiratory distress.     Breath sounds: Normal breath sounds.  Abdominal:     Tenderness: There is no abdominal tenderness.     Comments: Gravid, appears AGA  Genitourinary:    Comments: Sterile Speculum Exam: -Normal External Genitalia: Non tender, no apparent discharge at introitus.  -Vaginal Vault: Pink mucosa with good rugae. Moderate amt curdy white discharge -wet prep collected -Cervix:Pink, no lesions, cysts, or polyps.  Appears slightly open. No active bleeding from os-GC/CT collected -Bimanual Exam: Dilation: Fingertip Effacement (%): 50 Station: Ballotable Exam by:: Gerrit Heck, CNM  Musculoskeletal:     Cervical back: Normal range of motion.  Skin:    General: Skin is warm and dry.  Neurological:     Mental Status: She is alert and oriented to person, place, and time.  Psychiatric:        Mood and Affect: Mood normal. Affect is labile and tearful.        Behavior: Behavior normal.     Fetal Assessment 135 bpm, Mod Var, -Decels, +Accels Toco: Q3-82min  MAU Course   Results for orders placed or performed during the hospital encounter of 01/24/21 (from the past 24 hour(s))  Urinalysis, Routine w reflex microscopic Urine, Clean Catch     Status: Abnormal   Collection Time: 01/24/21  8:06 PM  Result Value Ref Range   Color, Urine YELLOW YELLOW   APPearance HAZY (A) CLEAR   Specific Gravity, Urine 1.014 1.005 - 1.030   pH 6.0 5.0 - 8.0   Glucose, UA NEGATIVE NEGATIVE mg/dL   Hgb urine dipstick NEGATIVE NEGATIVE   Bilirubin Urine NEGATIVE NEGATIVE   Ketones, ur NEGATIVE NEGATIVE mg/dL   Protein, ur NEGATIVE NEGATIVE mg/dL   Nitrite NEGATIVE NEGATIVE   Leukocytes,Ua LARGE (A) NEGATIVE   RBC / HPF 0-5 0 - 5 RBC/hpf   WBC, UA 0-5 0 - 5 WBC/hpf   Bacteria, UA MANY (A) NONE SEEN   Squamous Epithelial / LPF 6-10 0 - 5   Mucus PRESENT    Hyaline  Casts, UA PRESENT   Protein / creatinine ratio, urine     Status: None   Collection Time: 01/24/21  8:06 PM  Result Value Ref Range  Creatinine, Urine 133.57 mg/dL   Total Protein, Urine 17 mg/dL   Protein Creatinine Ratio 0.13 0.00 - 0.15 mg/mg[Cre]  POCT fern test     Status: None   Collection Time: 01/24/21  9:10 PM  Result Value Ref Range   POCT Fern Test Negative = intact amniotic membranes   Wet prep, genital     Status: Abnormal   Collection Time: 01/24/21  9:10 PM   Specimen: PATH Cytology Cervicovaginal Ancillary Only  Result Value Ref Range   Yeast Wet Prep HPF POC PRESENT (A) NONE SEEN   Trich, Wet Prep NONE SEEN NONE SEEN   Clue Cells Wet Prep HPF POC NONE SEEN NONE SEEN   WBC, Wet Prep HPF POC MANY (A) NONE SEEN   Sperm NONE SEEN   CBC     Status: Abnormal   Collection Time: 01/24/21  9:33 PM  Result Value Ref Range   WBC 13.2 (H) 4.0 - 10.5 K/uL   RBC 3.17 (L) 3.87 - 5.11 MIL/uL   Hemoglobin 8.8 (L) 12.0 - 15.0 g/dL   HCT 02.3 (L) 34.3 - 56.8 %   MCV 84.9 80.0 - 100.0 fL   MCH 27.8 26.0 - 34.0 pg   MCHC 32.7 30.0 - 36.0 g/dL   RDW 61.6 83.7 - 29.0 %   Platelets 185 150 - 400 K/uL   nRBC 0.0 0.0 - 0.2 %   No results found.  MDM Physical Exam Labs: CBC, CMP, PC Ratio Measure BPQ15 min EFM Pain Management Assessment and Plan  30 year old [id number]  SIUP at 40.6weeks Cat I FT DFM LOF HA  -Records reviewed in Care Everywhere, but limited PNV found. -POC Reviewed. -Exam performed and findings discussed. -Patient informed that findings not suspicious for SROM, but consistent with yeast infection. -Patient reports recent treatment. -Will give tylenol and flexeril for c/o pain. -Discussed fetal movement and when to be concerned. -Will order labs for elevated blood pressures and await results.  Cherre Robins MSN, CNM 01/24/2021, 8:37 PM    Reassessment (10:55 PM)  -Labs return normal. -Patient reports some improvement in pain with medication  dosing. -Patient informed of results and discharge. -Patient questions how she can get delivered this weekend and informed that she would need to present in active labor, with SROM, or with emergent criteria such as Severe PreEclampsia. -Patient informed that she would otherwise have to wait for her scheduled C/S on Monday Feb 7th. -Patient states she would rather have her baby this weekend because she does not have good caretakers of her other children during the week. -Provider verbalizes understanding. -Encouraged to call primary ob or return to MAU if symptoms worsen or with the onset of new symptoms. -Discharged to home in stable condition.  Cherre Robins MSN, CNM Advanced Practice Provider, [facility] [employer]

## 2021-01-26 LAB — CULTURE, OB URINE: Culture: NO GROWTH

## 2021-01-27 LAB — GC/CHLAMYDIA PROBE AMP (~~LOC~~) NOT AT ARMC
Chlamydia: NEGATIVE
Comment: NEGATIVE
Comment: NORMAL
Neisseria Gonorrhea: NEGATIVE

## 2021-06-12 ENCOUNTER — Other Ambulatory Visit: Payer: Self-pay

## 2021-06-12 ENCOUNTER — Encounter (HOSPITAL_COMMUNITY): Payer: Self-pay | Admitting: Emergency Medicine

## 2021-06-12 ENCOUNTER — Emergency Department (HOSPITAL_COMMUNITY)
Admission: EM | Admit: 2021-06-12 | Discharge: 2021-06-13 | Discharge status: Against Medical Advice | Payer: Medicaid Other | Attending: Emergency Medicine | Admitting: Emergency Medicine

## 2021-06-12 DIAGNOSIS — Z20822 Contact with and (suspected) exposure to covid-19: Secondary | ICD-10-CM | POA: Insufficient documentation

## 2021-06-12 DIAGNOSIS — Z5900 Homelessness unspecified: Secondary | ICD-10-CM | POA: Diagnosis present

## 2021-06-12 DIAGNOSIS — Z79899 Other long term (current) drug therapy: Secondary | ICD-10-CM | POA: Insufficient documentation

## 2021-06-12 DIAGNOSIS — F3173 Bipolar disorder, in partial remission, most recent episode manic: Secondary | ICD-10-CM | POA: Diagnosis not present

## 2021-06-12 DIAGNOSIS — Z87891 Personal history of nicotine dependence: Secondary | ICD-10-CM | POA: Insufficient documentation
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
## 2021-06-12 NOTE — ED Notes (Signed)
DSS at bedside. 

## 2021-06-13 ENCOUNTER — Emergency Department (HOSPITAL_COMMUNITY)
Admission: EM | Admit: 2021-06-13 | Discharge: 2021-06-16 | Disposition: A | Discharge status: Other Acute Inpatient Hospital | Payer: Medicaid Other | Source: Home / Self Care | Attending: Emergency Medicine | Admitting: Emergency Medicine

## 2021-06-13 ENCOUNTER — Other Ambulatory Visit: Payer: Self-pay

## 2021-06-13 ENCOUNTER — Encounter (HOSPITAL_COMMUNITY): Payer: Self-pay | Admitting: Emergency Medicine

## 2021-06-13 DIAGNOSIS — Z008 Encounter for other general examination: Secondary | ICD-10-CM

## 2021-06-13 DIAGNOSIS — F3173 Bipolar disorder, in partial remission, most recent episode manic: Secondary | ICD-10-CM | POA: Insufficient documentation

## 2021-06-13 DIAGNOSIS — Z87891 Personal history of nicotine dependence: Secondary | ICD-10-CM | POA: Insufficient documentation

## 2021-06-13 DIAGNOSIS — Y900 Blood alcohol level of less than 20 mg/100 ml: Secondary | ICD-10-CM | POA: Insufficient documentation

## 2021-06-13 DIAGNOSIS — Z79899 Other long term (current) drug therapy: Secondary | ICD-10-CM | POA: Insufficient documentation

## 2021-06-13 DIAGNOSIS — Z20822 Contact with and (suspected) exposure to covid-19: Secondary | ICD-10-CM | POA: Insufficient documentation

## 2021-06-13 LAB — CBC
HCT: 36.3 % (ref 36.0–46.0)
Hemoglobin: 11.4 g/dL — ABNORMAL LOW (ref 12.0–15.0)
MCH: 26.4 pg (ref 26.0–34.0)
MCHC: 31.4 g/dL (ref 30.0–36.0)
MCV: 84 fL (ref 80.0–100.0)
Platelets: 248 10*3/uL (ref 150–400)
RBC: 4.32 MIL/uL (ref 3.87–5.11)
RDW: 14.2 % (ref 11.5–15.5)
WBC: 8.9 10*3/uL (ref 4.0–10.5)
nRBC: 0 % (ref 0.0–0.2)

## 2021-06-13 LAB — COMPREHENSIVE METABOLIC PANEL
ALT: 14 U/L (ref 0–44)
AST: 18 U/L (ref 15–41)
Albumin: 4.3 g/dL (ref 3.5–5.0)
Alkaline Phosphatase: 40 U/L (ref 38–126)
Anion gap: 5 (ref 5–15)
BUN: 13 mg/dL (ref 6–20)
CO2: 27 mmol/L (ref 22–32)
Calcium: 9.5 mg/dL (ref 8.9–10.3)
Chloride: 103 mmol/L (ref 98–111)
Creatinine, Ser: 0.98 mg/dL (ref 0.44–1.00)
GFR, Estimated: >60 mL/min (ref >60)
Glucose, Bld: 94 mg/dL (ref 70–99)
Potassium: 3.3 mmol/L — ABNORMAL LOW (ref 3.5–5.1)
Sodium: 135 mmol/L (ref 135–145)
Total Bilirubin: 1.4 mg/dL — ABNORMAL HIGH (ref 0.3–1.2)
Total Protein: 8 g/dL (ref 6.5–8.1)

## 2021-06-13 LAB — URINALYSIS, ROUTINE W REFLEX MICROSCOPIC
Bacteria, UA: NONE SEEN
Bilirubin Urine: NEGATIVE
Glucose, UA: NEGATIVE mg/dL
Ketones, ur: 20 mg/dL — AB
Leukocytes,Ua: NEGATIVE
Nitrite: NEGATIVE
Protein, ur: NEGATIVE mg/dL
Specific Gravity, Urine: 1.004 — ABNORMAL LOW (ref 1.005–1.030)
pH: 6 (ref 5.0–8.0)

## 2021-06-13 LAB — RESP PANEL BY RT-PCR (FLU A&B, COVID) ARPGX2
Influenza A by PCR: NEGATIVE
Influenza B by PCR: NEGATIVE
SARS Coronavirus 2 by RT PCR: NEGATIVE

## 2021-06-13 LAB — RAPID URINE DRUG SCREEN, HOSP PERFORMED
Amphetamines: NOT DETECTED
Barbiturates: NOT DETECTED
Benzodiazepines: NOT DETECTED
Cocaine: NOT DETECTED
Opiates: NOT DETECTED
Tetrahydrocannabinol: POSITIVE — AB

## 2021-06-13 LAB — ACETAMINOPHEN LEVEL: Acetaminophen (Tylenol), Serum: <10 ug/mL — ABNORMAL LOW (ref 10–30)

## 2021-06-13 LAB — I-STAT BETA HCG BLOOD, ED (MC, WL, AP ONLY): I-stat hCG, quantitative: <5 m[IU]/mL (ref <5)

## 2021-06-13 LAB — PREGNANCY, URINE: Preg Test, Ur: NEGATIVE

## 2021-06-13 LAB — ETHANOL: Alcohol, Ethyl (B): <10 mg/dL (ref <10)

## 2021-06-13 LAB — SALICYLATE LEVEL: Salicylate Lvl: <7 mg/dL — ABNORMAL LOW (ref 7.0–30.0)

## 2021-06-13 MED ORDER — ZIPRASIDONE MESYLATE 20 MG IM SOLR
20.0000 mg | INTRAMUSCULAR | Status: AC | PRN
  Administered 2021-06-13: 20 mg via INTRAMUSCULAR
  Filled 2021-06-13: qty 20

## 2021-06-13 MED ORDER — LORAZEPAM 2 MG/ML IJ SOLN
1.0000 mg | Freq: Once | INTRAMUSCULAR | Status: AC
  Administered 2021-06-13: 1 mg via INTRAMUSCULAR
  Filled 2021-06-13: qty 1

## 2021-06-13 MED ORDER — LORAZEPAM 1 MG PO TABS
1.0000 mg | ORAL_TABLET | ORAL | Status: AC | PRN
  Administered 2021-06-13: 1 mg via ORAL
  Filled 2021-06-13: qty 1

## 2021-06-13 MED ORDER — RISPERIDONE 2 MG PO TBDP: 2.0000 mg | ORAL_TABLET | Freq: Three times a day (TID) | ORAL | Status: DC | PRN

## 2021-06-13 MED ORDER — LORAZEPAM 1 MG PO TABS
1.0000 mg | ORAL_TABLET | Freq: Once | ORAL | Status: DC
  Filled 2021-06-13 (×2): qty 1
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
## 2021-06-13 NOTE — ED Notes (Signed)
Patient is crying and agitated. Patient is requesting something to help her calm down.

## 2021-06-13 NOTE — ED Notes (Signed)
Patient upset, crying and screaming, pacing in and out of room. Patient initially refused IM Ativan, eventually agreed for RN to administer medication to help calm patient. 1 mg IM Ativan administered by Idalia Needle RN with security standby. Patient requesting shower, toiletries and fresh scrubs provided for shower. Security standing by until patient returns to room.

## 2021-06-13 NOTE — ED Provider Notes (Signed)
Emergency Medicine Observation Re-evaluation Note  Krystal Murray is a 30 y.o. female, seen on rounds today.  Pt initially presented to the ED for complaints of Medical Clearance Currently, the patient is IVC.  Physical Exam  BP 120/83 (BP Location: Left Arm)   Pulse 87   Temp 97.9 F (36.6 C) (Oral)   Resp 17   SpO2 100%  Physical Exam General: dishevel appearance Cardiac: RRR Lungs: CTAB Psych: manic, tearful, pressured speech  ED Course / MDM  EKG:   I have reviewed the labs performed to date as well as medications administered while in observation.  Recent changes in the last 24 hours include IVC and first exam done by me.  Plan  Current plan is for Further evaluation by [facility]. Patient is under full IVC at this time.   Fayrene Helper, PA-C 06/13/21 1231    Tilden Fossa, MD 06/13/21 [phone]

## 2021-06-13 NOTE — ED Triage Notes (Signed)
Patient here to have a mental health evaluation.  She states that she wants this so she can get her children back.  She denies any SI/HI at this time.

## 2021-06-13 NOTE — ED Notes (Signed)
Patient came to Nurses station asking to make a phone call this RN explained to patient that she already made a phone call. Patient is upset and walked back to room. States that she does not want the sitter that she currently has.

## 2021-06-13 NOTE — ED Notes (Signed)
Patient noted to be extremely labile, unable to decide what she would like to do with her care. Patient discussed with PA that she would like an exposure screening. Patient set up for screening, but then came out and stated she no longer wanted to be tested and she wanted to be discharged. Patient is argumentative with staff when they are attempting to explain. PA at bedside talking with patient.

## 2021-06-13 NOTE — ED Notes (Signed)
Patient being uncooperative and approaching staff asking for their full names. This RN asked patient to please go back in room and patient continued to go around the unit asking for staff members name's.
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
## 2021-06-13 NOTE — ED Notes (Signed)
Lab to add on UA and urine pregnancy
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
## 2021-06-13 NOTE — Progress Notes (Addendum)
Patient meets inpatient criteria per Celesta Gentile. Patient has been faxed out per the direction of Dr. Lucianne Muss. Patient referred to multiple hospitals for consideration of bed placement. CSW will continue to monitor disposition.

## 2021-06-13 NOTE — ED Notes (Signed)
Patient resting. Even Resp. No distress noted at this time. Sitter at bedside. Will continue to monitor.

## 2021-06-13 NOTE — ED Notes (Signed)
Patient decided she would like to be discharged, PA to do paperwork.

## 2021-06-13 NOTE — ED Provider Notes (Signed)
[facility] EMERGENCY DEPARTMENT Provider Note  CSN: [id number] Arrival date & time: 06/13/21 0303  Chief Complaint(s) Medical Clearance  HPI Krystal Murray is a 30 y.o. female here after she left prior to receiving her discharge paperwork from [facility].  She was there for medical clearance after her children were placed in DSS custody.  Patient will was found with her tolerating 73-month-old infant sleeping in a vehicle by [employer].  When asked why she is here the patient told me that she is here to protect [place] from people poking at [place].  Patient moves topic to topic.  She states that she took her children from the father because she wanted to protect the kids.  Then she moved on to talking about the grandmother and then began talking about her mother and now her mother is got psych issues.  Remainder of history, ROS, and physical exam limited due to patient's condition (psychiatric disorder).  Level V Caveat.   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 Authorizing Provider  Prenatal Multivit-Min-Fe-FA (PRENATAL 1 + IRON PO) Take by mouth.    [provider]  terconazole (TERAZOL 3) 0.8 % vaginal cream Place 1 applicator vaginally at bedtime. 01/24/21   Gerrit Heck, CNM                                                                                                                                    Past Surgical History Past Surgical History:  Procedure Laterality Date   CESAREAN SECTION N/A 02/07/2018   Procedure: CESAREAN SECTION;  Surgeon: Hermina Staggers, MD;   Location: [facility] BIRTHING SUITES;  Service: Obstetrics;  Laterality: N/A;   NO PAST SURGERIES     Family History Family History  Problem Relation Age of Onset   Cancer Maternal Aunt        uterine   Diabetes Maternal Aunt    Hypertension Maternal Aunt    Cancer Maternal Grandmother        breastt   Cancer Cousin        leaukemia    Social History Social History   Tobacco Use   Smoking status: Former    Pack years: 0.00    Types: Cigarettes    Quit date: 05/2017    Years since quitting: 4.0   Smokeless tobacco: Never  Vaping Use   Vaping Use: Never used  Substance Use Topics   Alcohol use: No   Drug use: No   Allergies Patient has no known allergies.  Review of Systems Review of Systems Unable to obtain due to psychiatric disorder Physical Exam Vital Signs  I have reviewed the triage vital signs BP 120/83 (BP Location: Left Arm)   Pulse 87   Temp 97.9 F (36.6 C) (Oral)   Resp 17   SpO2 100%   Physical Exam Vitals reviewed.  Constitutional:      General: She is not in acute distress.    Appearance: She is well-developed. She is not diaphoretic.  HENT:     Head: Normocephalic and atraumatic.     Right Ear: External ear normal.     Left Ear: External ear normal.     Nose: Nose normal.  Eyes:     General: No scleral icterus.    Conjunctiva/sclera: Conjunctivae normal.  Neck:     Trachea: Phonation normal.  Cardiovascular:     Rate and Rhythm: Normal rate and regular rhythm.  Pulmonary:     Effort: Pulmonary effort is normal. No respiratory distress.     Breath sounds: No stridor.  Abdominal:     General: There is no distension.  Musculoskeletal:        General: Normal range of motion.     Cervical back: Normal range of motion.  Neurological:     Mental Status: She is alert and oriented to person, place, and time.  Psychiatric:        Speech: Speech is tangential.    ED Results and Treatments Labs (all labs ordered are listed, but only abnormal  results are displayed) Labs Reviewed  COMPREHENSIVE METABOLIC PANEL - Abnormal; Notable for the following components:      Result Value   Potassium 3.3 (*)    Total Bilirubin 1.4 (*)    All other components within normal limits  SALICYLATE LEVEL - Abnormal; Notable for the following components:   Salicylate Lvl <7.0 (*)    All other components within normal limits  ACETAMINOPHEN LEVEL - Abnormal; Notable for the following components:   Acetaminophen (Tylenol), Serum <10 (*)    All other components within normal limits  CBC - Abnormal; Notable for the following components:   Hemoglobin 11.4 (*)    All other components within normal limits  RAPID URINE DRUG SCREEN, HOSP PERFORMED - Abnormal; Notable for the following components:   Tetrahydrocannabinol POSITIVE (*)    All other components within normal limits  RESP PANEL BY RT-PCR (FLU A&B, COVID) ARPGX2  ETHANOL  I-STAT BETA HCG BLOOD, ED ([facility], [facility], [facility] ONLY)                                                                                                                         EKG  EKG Interpretation  Date/Time:    Ventricular Rate:    PR Interval:    QRS Duration:   QT Interval:    QTC Calculation:   R Axis:     Text Interpretation:          Radiology No results  found.  Pertinent labs & imaging results that were available during my care of the patient were reviewed by me and considered in my medical decision making (see chart for details).  Medications Ordered in ED Medications - No data to display                                                                                                                                  Procedures Procedures  (including critical care time)  Medical Decision Making / ED Course I have reviewed the nursing notes for this encounter and the patient's prior records (if available in EHR or on provided paperwork).   [name] was evaluated in Emergency Department on 06/13/2021  for the symptoms described in the history of present illness. She was evaluated in the context of the global COVID-19 pandemic, which necessitated consideration that the patient might be at risk for infection with the SARS-CoV-2 virus that causes COVID-19. Institutional protocols and algorithms that pertain to the evaluation of patients at risk for COVID-19 are in a state of rapid change based on information released by regulatory bodies including the CDC and federal and state organizations. These policies and algorithms were followed during the patient's care in the ED.  Patient is here for medical clearance. During previous note they reported that patient was under a lot of stress. She recently had her kids taken by DSS. Unsure if patient is undergoing acute psychosis due to recent incidents. Labs reassuring.  Patient medically cleared for behavioral health evaluation and disposition.     Final Clinical Impression(s) / ED Diagnoses Final diagnoses:  Encounter for medical clearance for patient hold      This chart was dictated using voice recognition software.  Despite best efforts to proofread,  errors can occur which can change the documentation meaning.    Nira Conn, MD 06/13/21 [phone]

## 2021-06-13 NOTE — ED Notes (Signed)
Patient back through double doors, stating that she would like the testing now because she doesn't know how to get to [facility] because the police dropped her off. PA made aware. Patient back into room.

## 2021-06-13 NOTE — ED Notes (Signed)
Patient's belongings located in visitor locker 1 in purple zone

## 2021-06-13 NOTE — Progress Notes (Signed)
Patient information has been sent to [facility] via secure chat to review for potential admission. Patient meets inpatient criteria per Nira Conn, NP.   Situation ongoing, CSW will continue to monitor progress.    Signed:  Damita Dunnings, MSW, LCSW-A  06/13/2021 9:36 AM

## 2021-06-13 NOTE — ED Notes (Signed)
Patient decided again that she no longer wanted to be seen and tested. Patient left department again.

## 2021-06-13 NOTE — ED Notes (Addendum)
Patient decided she did not want to wait for her discharge paperwork and left department. Patient refused vitals and to sign stating she wanted to go get her children.

## 2021-06-13 NOTE — ED Notes (Signed)
Pt has been wanded by security. 

## 2021-06-13 NOTE — ED Provider Notes (Signed)
[facility]-EMERGENCY DEPT Provider Note   CSN: [id number] Arrival date & time: 06/12/21  2121     History Chief Complaint  Patient presents with   Medical Clearance    Krystal Murray is a 30 y.o. female.  Patient presents after being found sleeping in her car with her children by [employer]. To ED with toddler and 73 month old infant. DSS involved and is present in the department reporting she has an open DSS case in [facility]. Trying to coordinate care for children. Per DSS worker, no grounds for IVC of the patient. Will wait for disposition of the children to determine best course of action for the patient.   Children removed with DSS.  The patient reports she has been stressed lately with difficulty living at home with her mother. She has been told not to return. She denies SI/HI/AVH. She reports she needs resources for housing, social services. She is concerned about exposure to infection and is requesting testing for vaginal infection. No fever, abdominal pain, vaginal discharge.        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   Status post C-section 02/07/2018    Past Surgical History:  Procedure Laterality Date   CESAREAN SECTION N/A 02/07/2018   Procedure: CESAREAN SECTION;  Surgeon: Hermina Staggers, MD;  Location: [facility] BIRTHING SUITES;  Service: Obstetrics;  Laterality: N/A;   NO PAST SURGERIES       OB History     Gravida  6   Para  3   Term  3   Preterm  0   AB  2   Living  3      SAB  1   IAB  1   Ectopic  0   Multiple  0   Live Births  3           Family History  Problem Relation Age of Onset   Cancer Maternal Aunt        uterine   Diabetes Maternal Aunt    Hypertension  Maternal Aunt    Cancer Maternal Grandmother        breastt   Cancer Cousin        leaukemia    Social History   Tobacco Use   Smoking status: Former    Pack years: 0.00    Types: Cigarettes    Quit date: 05/2017    Years since quitting: 4.0   Smokeless tobacco: Never  Vaping Use   Vaping Use: Never used  Substance Use Topics   Alcohol use: No   Drug use: No    Home Medications Prior to Admission medications   Medication Sig Start Date End Date Taking? Authorizing Provider  Prenatal Multivit-Min-Fe-FA (PRENATAL 1 + IRON PO) Take by mouth.    [provider]  terconazole (TERAZOL 3) 0.8 % vaginal cream Place 1 applicator vaginally at bedtime. 01/24/21   Gerrit Heck, CNM    Allergies    Patient has no known allergies.  Review of Systems   Review of Systems  Constitutional:  Negative for chills and fever.  HENT: Negative.    Respiratory: Negative.    Cardiovascular: Negative.   Gastrointestinal: Negative.  Negative for abdominal pain.  Genitourinary:  Negative for pelvic pain and vaginal discharge.  Musculoskeletal: Negative.   Skin: Negative.   Neurological: Negative.   Psychiatric/Behavioral:  Negative for hallucinations and suicidal ideas.        States she has been stressed due to her social situation.   Physical Exam Updated Vital Signs BP (!) 142/78   Pulse (!) 106   Temp 99.2 F (37.3 C) (Oral)   Resp 16   SpO2 98%   Breastfeeding Yes   Physical Exam Vitals and nursing note reviewed.  Constitutional:      General: She is not in acute distress.    Appearance: She is well-developed. She is not ill-appearing.  Pulmonary:     Effort: Pulmonary effort is normal.  Musculoskeletal:        General: Normal range of motion.     Cervical back: Normal range of motion.  Skin:    General: Skin is warm and dry.  Neurological:     Mental Status: She is alert and oriented to person, place, and time.    ED Results / Procedures / Treatments    Labs (all labs ordered are listed, but only abnormal results are displayed) Labs Reviewed - No data to display  EKG None  Radiology No results found.  Procedures Procedures   Medications Ordered in ED Medications - No data to display  ED Course  I have reviewed the triage vital signs and the nursing notes.  Pertinent labs & imaging results that were available during my care of the patient were reviewed by me and considered in my medical decision making (see chart for details).    MDM Rules/Calculators/A&P                          Patient to ED as per HPI.  When seen she expresses that she is under significant stress. She reports concern for infection, requests testing for pelvic infections but denies discharge, pain, fever, urinary symptoms. No fever, vomiting.   The patient is oriented. Her speech is slightly pressured, content labile but directable. She is concerned that her children were just taken from her and wants to go get them.   As she is getting set up for pelvic exam, the patient changes her mind and requests discharge to take care of the situation with her kids.   She continues to deny SI/HI/AVH. Will discharge with resources for housing and social services.   ADDENDUM: the patient left the department prior to discharge papers being provided, so was not given the requested/needed resources for social support.   Final Clinical Impression(s) / ED Diagnoses Final diagnoses:  None   Homelessness  Rx / DC Orders ED Discharge Orders     None        Elpidio Anis, PA-C 06/13/21 0323    Gilda Crease, MD 06/13/21 [phone]

## 2021-06-13 NOTE — ED Notes (Signed)
Patient upset and anxious in hallway, pacing hall and approaching charge desk frequently. Moved to room in purple to decrease stimulation and make patient more comfortable. Patient currently being assessed by counselor, anxiety escalating. Patient refusing to take ordered PO Ativan. Will notify MD if IM medications needed for escalating behaviors. Ativan wasted in Pyxis with [place] RN.

## 2021-06-14 MED ORDER — LORAZEPAM 1 MG PO TABS
1.0000 mg | ORAL_TABLET | Freq: Three times a day (TID) | ORAL | Status: DC | PRN
  Administered 2021-06-14: 1 mg via ORAL
  Filled 2021-06-14: qty 1

## 2021-06-14 MED ORDER — ZIPRASIDONE HCL 20 MG PO CAPS
20.0000 mg | ORAL_CAPSULE | Freq: Two times a day (BID) | ORAL | Status: DC
  Administered 2021-06-14 – 2021-06-16 (×4): 20 mg via ORAL
  Filled 2021-06-14 (×4): qty 1

## 2021-06-14 MED ORDER — LORAZEPAM 2 MG/ML IJ SOLN
1.0000 mg | Freq: Three times a day (TID) | INTRAMUSCULAR | Status: DC | PRN
  Administered 2021-06-15: 1 mg via INTRAMUSCULAR
  Filled 2021-06-14: qty 1

## 2021-06-14 MED ORDER — ZIPRASIDONE HCL 20 MG PO CAPS: 20.0000 mg | ORAL_CAPSULE | Freq: Two times a day (BID) | ORAL | Status: DC

## 2021-06-14 NOTE — ED Notes (Signed)
Patient sleeping. Even Respirations. No distress noted at this time. Will continue to monitor. 

## 2021-06-14 NOTE — Progress Notes (Signed)
CSW received a call from [place] with [facility] who advised that today there are no female beds available and the patient can be transported to the facility Monday. CSW informed the patient's current nurse and will update information in shift report.   Crissie Reese, MSW, LCSW-A, LCAS-A Phone: [phone] Disposition/TOC

## 2021-06-14 NOTE — ED Notes (Signed)
Patient awake, requesting to take shower. Toiletries and fresh linens provided. Patient showered and returned to room, eating breakfast at this time.

## 2021-06-14 NOTE — ED Notes (Signed)
Patient called nurse to bedside, states that she keeps biting her lip because she feel nervous. This RN offered PO Ativan for anxiety. Patient calm and cooperative with medication administration.

## 2021-06-14 NOTE — ED Notes (Signed)
This RN received phone call from ED secretary stating that the patient's fiance wanted to be updated on patient. This RN asked patient if she was engaged and patient stated "no." This RN informed patient that she would not update anyone without her permission and patient agreed that she did not want the caller updated. Secretary called back and stated that the man's name was Denarius and that he has a child with the patient and would like his number noted. The message was relayed to patient who stated that she does not want this person updated, he is the father of one of her children but they have never been engaged. She additionally stated that this person was hurting her child. This RN informed patient that her wishes would be noted in chart and followed by staff.

## 2021-06-14 NOTE — ED Provider Notes (Signed)
Emergency Medicine Observation Re-evaluation Note  Annita TRICHELLE LEHAN is a 30 y.o. female, seen on rounds today.  Pt initially presented to the ED for complaints of Medical Clearance Currently, the patient is under IVC, sleeping.  Physical Exam  BP 99/67 (BP Location: Left Arm)   Pulse 93   Temp 98.5 F (36.9 C)   Resp 18   Ht 5\' 5"  (1.651 m)   Wt 81.6 kg   SpO2 99%   BMI 29.95 kg/m  Physical Exam General: Disheveled Cardiac: Regular rate and rhythm Lungs: CTAB Psych: Tangential speech, manic  ED Course / MDM  EKG:   I have reviewed the labs performed to date as well as medications administered while in observation.  Recent changes in the last 24 hours include evaluated by [facility]  Plan  Current plan is for inpatient psych admission, pending placement Patient is under full IVC at this time.   [facility], PA-C 06/14/21 06/16/21    Tegeler, [id number], MD 06/14/21 [phone]

## 2021-06-14 NOTE — ED Notes (Signed)
Notified [employer] for transportation to [facility]

## 2021-06-14 NOTE — ED Notes (Addendum)
Patient asked for paper and something to write with, instruments provided. Over the span of half hour patient has approached nurse's desk 3 times to deliver notes/drawings stating the following:   "Family  Krystal Murray Krystal Murray love yall.  I love my kids before any man idc how much money or love anyone can give my kid will always come before me."  "Krystal Murray 28 Nov. 28 1992    Favorite Color: Orange I love to sing, dance. I really enjoy taking my kids to the park. Kids love to get icey. My kids love dogs cats but we have a lizard. Krystal Murray is her/his name. We love are lizard!"  "My heart is Pure love = Joy"

## 2021-06-14 NOTE — ED Notes (Signed)
Patient currently exhibiting paranoia, stating that "something is not right here" and alleging that we are holding her against her will and injecting her with drugs to keep her here. Patient states that the current staff is not mistreating her but that we are not always here. Patient reassured that she is safe and redirected back to room.

## 2021-06-14 NOTE — Progress Notes (Addendum)
Per Dellia Cloud , pt has been accepted to [facility]. Accepting provider is Dr. Johney Maine. Patient can arrive 06/16/2021, anytime. Number for report is [phone] or [phone]. Please fax IVC  paperwork to the receiving facility before calling report. The fax number is [phone].    Crissie Reese, MSW, LCSW-A Phone: [phone] Disposition/TOC

## 2021-06-14 NOTE — ED Notes (Signed)
Patient currently making first phone call of the day

## 2021-06-14 NOTE — Progress Notes (Signed)
Per Shnese Mills,NP, patient meets criteria for inpatient treatment. There are no available or appropriate beds at [facility] today. CSW faxed referrals to the following facilities for review:  [facility]  TTS will continue to seek bed placement.  Candice Lunney, MSW, LCSW-A, LCAS-A Phone: [phone] Disposition/TOC  

## 2021-06-14 NOTE — ED Notes (Signed)
Patient making second and last phone call of the day. Patient verbalizes understanding that this will be her last phone call of the day.

## 2021-06-14 NOTE — BH Assessment (Signed)
TTS Reassessment:  Patient was seen today for reassessment.  Patient continues to be disorganized and hyper-verbal.  Patient has been very anxious in the ED and requiring sedation.  Patient denies SI/HI, but ED staff reports that patient has been very paranoid at times.  Patient states that she has not been sleeping or eating well in the ED.  Due to her disorganization, agitation and paranoia, patient continues to be recommended for inpatient treatment.

## 2021-06-14 NOTE — ED Notes (Signed)
Patient sleeping. Even Respirations. No distress noted at this time. Will continue to monitor.

## 2021-06-14 NOTE — ED Notes (Addendum)
Lobby NT came to purple zone to inform RN that patient's stepmom is in the lobby requesting to see the patient and stating that no one is updating her family on the patient. Lobby NT informed visitor that patient is unable to have visitors at this time due to psychiatric hold and IVC. Lobby NT to tell visitor that patient has already made her selected phone calls for the day and will call her tomorrow. Phone number provided to patient. Patient verbally acknowledged understanding of phone call privileges.

## 2021-06-15 MED ORDER — ACETAMINOPHEN 325 MG PO TABS
650.0000 mg | ORAL_TABLET | Freq: Four times a day (QID) | ORAL | Status: DC | PRN
  Administered 2021-06-15: 650 mg via ORAL
  Filled 2021-06-15: qty 2

## 2021-06-15 NOTE — ED Notes (Signed)
Pt encouraged to sip on her water & get some rest before breakfast arrives to see if she can tolerate anything PO.

## 2021-06-15 NOTE — ED Notes (Signed)
Pt used second phone call today. Advised this would be her last call for today. Pt agreeable.

## 2021-06-15 NOTE — ED Notes (Signed)
Pt became tearful when administering tylenol and geodon. Pt stating "They won't let me take care of my kids on this medicine". Pt reassured and redirected. Agreeable to taking medication.

## 2021-06-15 NOTE — ED Provider Notes (Signed)
Emergency Medicine Observation Re-evaluation Note  Krystal Murray is a 30 y.o. female, seen on rounds today.  Pt initially presented to the ED for complaints of Medical Clearance Currently, the patient is sleeping  Physical Exam  BP 109/71 (BP Location: Left Arm)   Pulse 82   Temp 98 F (36.7 C)   Resp 18   Ht 5\' 5"  (1.651 m)   Wt 86 kg   SpO2 100%   BMI 31.57 kg/m  Physical Exam General: Calm Cardiac: RRR Lungs: CTAB Psych: Anxious, tangential  ED Course / MDM  EKG:   I have reviewed the labs performed to date as well as medications administered while in observation.  Recent changes in the last 24 hours include none  Plan  Current plan is for placement to adult unit likely on Monday  Patient is under full IVC at this time.   Sunday, PA-C 06/15/21 06/17/21    Tegeler, [id number], MD 06/15/21 [phone]

## 2021-06-15 NOTE — ED Notes (Signed)
Pt finished with phone call, pt returns to room breakfast tray at bedside.

## 2021-06-15 NOTE — ED Notes (Signed)
Patient sleeping, will obtain vitals upon awakening

## 2021-06-15 NOTE — ED Notes (Signed)
Pt allowed to use the phone for her first phone call today.

## 2021-06-15 NOTE — ED Notes (Addendum)
Pt was heard coughing very deep in her room, when this RN went to bedside to check on her she was holding a disposable pan in her lap spitting in it, appearing to vomit. She stated "I'm sick." Pt was offered IM ativan for her symptoms & she accepted.

## 2021-06-15 NOTE — ED Notes (Signed)
Pt's father, Ailene Ravel ([phone]) given update by this RN

## 2021-06-15 NOTE — ED Notes (Signed)
Pt's stepmother came to ED to gather pt's car keys. Pt expressed to only give keys to father. All belongings placed back in [employer].

## 2021-06-16 NOTE — ED Notes (Signed)
This RN arranged transportation with [place] [employer] for pt to go to [facility]. They plan to arrive around 1000.

## 2021-06-16 NOTE — ED Notes (Signed)
Sheriff Officers arrived and gave them patient's belongings and all paperwork. Pt in good spirits and agreeable.

## 2021-06-16 NOTE — ED Provider Notes (Addendum)
Emergency Medicine Observation Re-evaluation Note  Krystal Murray is a 30 y.o. female, seen on rounds today.  Pt initially presented to the ED for complaints of Medical Clearance Currently, the patient is walking around her room, calm and cooperative.  Physical Exam  BP 92/67   Pulse 97   Temp 98.1 F (36.7 C) (Oral)   Resp 19   Ht 5\' 5"  (1.651 m)   Wt 86 kg   SpO2 99%   BMI 31.57 kg/m  Physical Exam General: NAD Lungs: normal WOB Psych: currently calm and resting  ED Course / MDM  EKG:   I have reviewed the labs performed to date as well as medications administered while in observation.  Recent changes in the last 24 hours include none.  Plan  Current plan is for transfer to adult center today, transfer done and EMTALA filled out. Patient is under full IVC at this time.       [facility], DO 06/16/21 1004

## 2021-06-16 NOTE — ED Notes (Signed)
Patient requesting shower before leaving for [facility].

## 2021-06-16 NOTE — ED Notes (Signed)
Breakfast order placed ?

## 2021-06-16 NOTE — ED Notes (Signed)
Patient was given 2 Cups of ice water.

## 2021-06-16 NOTE — ED Notes (Signed)
This RN spoke with Wellington Hampshire, RN from [facility]. Report has been called. Pt can arrive after 1200 today. They have received her IVC paperwork. Pt is also aware of the plan for today and is agreeable.

## 2021-06-16 NOTE — ED Notes (Signed)
Patient was given a Cup of water. 

## 2021-06-16 NOTE — ED Notes (Signed)
Patient was given Cranberry Juice.

## 2022-03-16 ENCOUNTER — Other Ambulatory Visit: Payer: Self-pay

## 2022-03-16 ENCOUNTER — Ambulatory Visit (HOSPITAL_COMMUNITY)
Admission: EM | Admit: 2022-03-16 | Discharge: 2022-03-16 | Disposition: A | Discharge status: Home / Self Care | Payer: Medicaid Other | Attending: Nurse Practitioner | Admitting: Nurse Practitioner

## 2022-03-16 ENCOUNTER — Inpatient Hospital Stay (HOSPITAL_COMMUNITY)
Admission: RE | Admit: 2022-03-16 | Discharge: 2022-03-16 | Disposition: A | Discharge status: Home / Self Care | Payer: Self-pay | Source: Ambulatory Visit

## 2022-03-16 ENCOUNTER — Encounter (HOSPITAL_COMMUNITY): Payer: Self-pay | Admitting: *Deleted

## 2022-03-16 DIAGNOSIS — N760 Acute vaginitis: Secondary | ICD-10-CM

## 2022-03-16 DIAGNOSIS — N898 Other specified noninflammatory disorders of vagina: Secondary | ICD-10-CM | POA: Diagnosis not present

## 2022-03-16 DIAGNOSIS — B9689 Other specified bacterial agents as the cause of diseases classified elsewhere: Secondary | ICD-10-CM | POA: Diagnosis not present

## 2022-03-16 LAB — POCT URINALYSIS DIPSTICK, ED / UC
Bilirubin Urine: NEGATIVE
Glucose, UA: NEGATIVE mg/dL
Hgb urine dipstick: NEGATIVE
Nitrite: NEGATIVE
Protein, ur: 30 mg/dL — AB
Specific Gravity, Urine: 1.025 (ref 1.005–1.030)
Urobilinogen, UA: 1 mg/dL (ref 0.0–1.0)
pH: 6 (ref 5.0–8.0)

## 2022-03-16 LAB — POC URINE PREG, ED: Preg Test, Ur: NEGATIVE

## 2022-03-16 MED ORDER — METRONIDAZOLE 500 MG PO TABS: 500.0000 mg | ORAL_TABLET | Freq: Two times a day (BID) | ORAL | 0 refills | Status: AC

## 2022-03-16 NOTE — ED Triage Notes (Signed)
Pt reports Vag discharge for a couple of days. ?

## 2022-03-16 NOTE — ED Provider Notes (Signed)
?[facility] ? ? ? ?CSN: [id number] ?Arrival date & time: 03/16/22  1034 ? ? ?  ? ?History   ?Chief Complaint ?Chief Complaint  ?Patient presents with  ? Vaginal Discharge  ? ? ?HPI ?Krystal Murray is a 31 y.o. female.  ? ?The patient is a 31 year old female who presents with vaginal discharge.  Patient states symptoms have been present for 2 days.  She describes the discharge as thick, white, and "does not smell right".  She denies vaginal itching, abdominal pain, or urinary symptoms.  Her last menstrual cycle was on 02/23/2022.  She reports she has had 2 female partners in the past 90 days with 80% condom use.  She declines HIV/RPR testing today.  Patient states that she feels like this is BV. Patient has a history of trichomonas and gonorrhea.  ? ? ? ?Past Medical History:  ?Diagnosis Date  ? No pertinent past medical history   ? UTI (lower urinary tract infection)   ? ? ?Patient Active Problem List  ? Diagnosis Date Noted  ? Postoperative infection, initial encounter 02/25/2018  ? 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 Status post C-section 02/07/2018  ? ? ?Past Surgical History:  ?Procedure Laterality Date  ? CESAREAN SECTION N/A 02/07/2018  ? Procedure: CESAREAN SECTION;  Surgeon: Hermina StaggersErvin, Michael L, MD;  [facility];  Service: Obstetrics;  Laterality: N/A;  ? NO PAST SURGERIES    ? ? ?OB History   ? ? Gravida  ?6  ? Para  ?3  ? Term  ?3  ? Preterm  ?0  ? AB  ?2  ? Living  ?3  ?  ? ? SAB  ?1  ? IAB  ?1  ? Ectopic  ?0  ? Multiple  ?0  ? Live Births  ?3  ?   ?  ?  ? ? ? ?Home Medications   ? ?Prior to Admission medications   ?Medication Sig Start Date End Date Taking? Authorizing Provider  ?metroNIDAZOLE (FLAGYL) 500 MG tablet Take 1 tablet (500 mg total) by mouth 2 (two) times daily for 7 days. 03/16/22 03/23/22 Yes Deaundra Kutzer-Warren, Sadie Haberhristie J, NP  ?terconazole (TERAZOL 3) 0.8 % vaginal cream Place 1 applicator vaginally at bedtime. ?Patient not taking: Reported on 06/13/2021  01/24/21   Gerrit HeckEmly, Jessica, CNM  ? ? ?Family History ?Family History  ?Problem Relation Age of Onset  ? Cancer Maternal Grandmother   ?     breastt  ? Cancer Maternal Aunt   ?     uterine  ? Diabetes Maternal Aunt   ? Hypertension Maternal Aunt   ? Cancer Cousin   ?     leaukemia  ? ? ?Social History ?Social History  ? ?Tobacco Use  ? Smoking status: Former  ?  Types: Cigarettes  ?  Quit date: 05/2017  ?  Years since quitting: 4.8  ? Smokeless tobacco: Never  ?Vaping Use  ? Vaping Use: Never used  ?Substance Use Topics  ? Alcohol use: No  ? Drug use: No  ? ? ? ?Allergies   ?Patient has no known allergies. ? ? ?Review of Systems ?Review of Systems  ?Constitutional: Negative.   ?Cardiovascular: Negative.   ?Gastrointestinal: Negative.   ?Genitourinary:  Positive for vaginal discharge. Negative for dysuria, flank pain, frequency, menstrual problem, pelvic pain, urgency, vaginal bleeding and vaginal pain.  ?Skin: Negative.   ?Psychiatric/Behavioral: Negative.    ? ? ?Physical Exam ?Triage Vital Signs ?ED Triage Vitals  ?  Enc Vitals Group  ?   BP 03/16/22 1117 121/76  ?   Pulse Rate 03/16/22 1117 67  ?   Resp 03/16/22 1117 18  ?   Temp 03/16/22 1117 98.7 ?F (37.1 ?C)  ?   Temp src --   ?   SpO2 03/16/22 1117 99 %  ?   Weight --   ?   Height --   ?   Head Circumference --   ?   Peak Flow --   ?   Pain Score 03/16/22 1114 0  ?   Pain Loc --   ?   Pain Edu? --   ?   Excl. in GC? --   ? ?No data found. ? ?Updated Vital Signs ?BP 121/76   Pulse 67   Temp 98.7 ?F (37.1 ?C)   Resp 18   LMP 02/23/2022   SpO2 99%  ? ?Visual Acuity ?Right Eye Distance:   ?Left Eye Distance:   ?Bilateral Distance:   ? ?Right Eye Near:   ?Left Eye Near:    ?Bilateral Near:    ? ?Physical Exam ?Vitals reviewed.  ?Constitutional:   ?   Appearance: Normal appearance.  ?HENT:  ?   Head: Normocephalic and atraumatic.  ?Cardiovascular:  ?   Rate and Rhythm: Normal rate and regular rhythm.  ?   Pulses: Normal pulses.  ?   Heart sounds: Normal heart sounds.   ?Pulmonary:  ?   Effort: Pulmonary effort is normal.  ?   Breath sounds: Normal breath sounds.  ?Abdominal:  ?   General: Bowel sounds are normal.  ?   Palpations: Abdomen is soft.  ?   Tenderness: There is no abdominal tenderness.  ?Skin: ?   General: Skin is warm and dry.  ?Neurological:  ?   Mental Status: She is alert and oriented to person, place, and time.  ?Psychiatric:     ?   Mood and Affect: Mood normal.     ?   Behavior: Behavior normal.  ? ? ? ?UC Treatments / Results  ?Labs ?(all labs ordered are listed, but only abnormal results are displayed) ?Labs Reviewed  ?POCT URINALYSIS DIPSTICK, ED / UC - Abnormal; Notable for the following components:  ?    Result Value  ? Ketones, ur TRACE (*)   ? Protein, ur 30 (*)   ? Leukocytes,Ua SMALL (*)   ? All other components within normal limits  ?POC URINE PREG, ED  ?CERVICOVAGINAL ANCILLARY ONLY  ? ? ?EKG ? ? ?Radiology ?No results found. ? ?Procedures ?Procedures (including critical care time) ? ?Medications Ordered in UC ?Medications - No data to display ? ?Initial Impression / Assessment and Plan / UC Course  ?I have reviewed the triage vital signs and the nursing notes. ? ?Pertinent labs & imaging results that were available during my care of the patient were reviewed by me and considered in my medical decision making (see chart for details). ? ?The patient is a 31 year old female who presents for vaginal discharge.  Patient describes the discharge is similar to what she had experienced with previous BV.  Will prescribe her not dissolved for her symptoms.   Urinalysis does show small leukocytes and ketones with protein, but symptoms are probably related to the sample.  Her urine pregnancy is negative today.  Patient was informed if her cytology swab returns negative for BV, she will be instructed to discontinue the medication. Patient advised that results should be back within the next  24 to 48 hours, and if any treatment is recommended, she will be contacted.   Patient encouraged to increase condom use.  Follow-up as needed. ?Final Clinical Impressions(s) / UC Diagnoses  ? ?Final diagnoses:  ?BV (bacterial vaginosis)  ? ? ? ?Discharge Instructions   ? ?  ?Take medication as prescribed. ?Your urinalysis and urine pregnancy test were negative today. ?You will be contacted once your cytology results are received if treatment is indicated.  If your cytology result is negative for BV, you will be contacted and asked to stop the medication. ?Increase condom use. ?Follow-up as needed. ? ? ? ? ?ED Prescriptions   ? ? Medication Sig Dispense Auth. Provider  ? metroNIDAZOLE (FLAGYL) 500 MG tablet Take 1 tablet (500 mg total) by mouth 2 (two) times daily for 7 days. 14 tablet Rozann Holts-Warren, Sadie Haber, NP  ? ?  ? ?PDMP not reviewed this encounter. ?  ?Abran Cantor, NP ?03/16/22 1158 ? ?

## 2022-03-16 NOTE — Discharge Instructions (Addendum)
Take medication as prescribed. ?Your urinalysis and urine pregnancy test were negative today. ?You will be contacted once your cytology results are received if treatment is indicated.  If your cytology result is negative for BV, you will be contacted and asked to stop the medication. ?Increase condom use. ?Follow-up as needed. ?

## 2022-03-19 LAB — CERVICOVAGINAL ANCILLARY ONLY
Bacterial Vaginitis (gardnerella): POSITIVE — AB
Candida Glabrata: NEGATIVE
Candida Vaginitis: NEGATIVE
Chlamydia: NEGATIVE
Comment: NEGATIVE
Comment: NEGATIVE
Comment: NEGATIVE
Comment: NEGATIVE
Comment: NEGATIVE
Comment: NORMAL
Neisseria Gonorrhea: NEGATIVE
Trichomonas: POSITIVE — AB

## 2022-04-03 ENCOUNTER — Telehealth (HOSPITAL_COMMUNITY): Payer: Self-pay | Admitting: Emergency Medicine

## 2022-04-03 MED ORDER — METRONIDAZOLE 500 MG PO TABS: 500.0000 mg | ORAL_TABLET | Freq: Two times a day (BID) | ORAL | 0 refills | Status: DC

## 2022-04-03 NOTE — Telephone Encounter (Signed)
Patient called after receiving letter in mail for results.  Reviewed with patient, she states she did not finish abx and needs a refill, reviewed with Lorene Dy, APP, who okay'd refill.   ?

## 2022-05-31 ENCOUNTER — Emergency Department (HOSPITAL_BASED_OUTPATIENT_CLINIC_OR_DEPARTMENT_OTHER)
Admission: EM | Admit: 2022-05-31 | Discharge: 2022-05-31 | Disposition: A | Discharge status: Home / Self Care | Payer: Medicaid Other | Attending: Emergency Medicine | Admitting: Emergency Medicine

## 2022-05-31 ENCOUNTER — Other Ambulatory Visit: Payer: Self-pay

## 2022-05-31 ENCOUNTER — Encounter (HOSPITAL_BASED_OUTPATIENT_CLINIC_OR_DEPARTMENT_OTHER): Payer: Self-pay

## 2022-05-31 DIAGNOSIS — F1010 Alcohol abuse, uncomplicated: Secondary | ICD-10-CM | POA: Diagnosis not present

## 2022-05-31 DIAGNOSIS — R112 Nausea with vomiting, unspecified: Secondary | ICD-10-CM | POA: Diagnosis present

## 2022-05-31 LAB — LIPASE, BLOOD: Lipase: <10 U/L — ABNORMAL LOW (ref 11–51)

## 2022-05-31 LAB — COMPREHENSIVE METABOLIC PANEL
ALT: 11 U/L (ref 0–44)
AST: 15 U/L (ref 15–41)
Albumin: 4.5 g/dL (ref 3.5–5.0)
Alkaline Phosphatase: 40 U/L (ref 38–126)
Anion gap: 12 (ref 5–15)
BUN: 12 mg/dL (ref 6–20)
CO2: 25 mmol/L (ref 22–32)
Calcium: 9.7 mg/dL (ref 8.9–10.3)
Chloride: 106 mmol/L (ref 98–111)
Creatinine, Ser: 0.78 mg/dL (ref 0.44–1.00)
GFR, Estimated: >60 mL/min (ref >60)
Glucose, Bld: 93 mg/dL (ref 70–99)
Potassium: 3.8 mmol/L (ref 3.5–5.1)
Sodium: 143 mmol/L (ref 135–145)
Total Bilirubin: 0.5 mg/dL (ref 0.3–1.2)
Total Protein: 8.2 g/dL — ABNORMAL HIGH (ref 6.5–8.1)

## 2022-05-31 LAB — URINALYSIS, ROUTINE W REFLEX MICROSCOPIC
Bilirubin Urine: NEGATIVE
Glucose, UA: NEGATIVE mg/dL
Hgb urine dipstick: NEGATIVE
Ketones, ur: 15 mg/dL — AB
Leukocytes,Ua: NEGATIVE
Nitrite: NEGATIVE
Specific Gravity, Urine: 1.023 (ref 1.005–1.030)
pH: 6 (ref 5.0–8.0)

## 2022-05-31 LAB — CBC
HCT: 35.2 % — ABNORMAL LOW (ref 36.0–46.0)
Hemoglobin: 10.8 g/dL — ABNORMAL LOW (ref 12.0–15.0)
MCH: 24.7 pg — ABNORMAL LOW (ref 26.0–34.0)
MCHC: 30.7 g/dL (ref 30.0–36.0)
MCV: 80.5 fL (ref 80.0–100.0)
Platelets: 225 10*3/uL (ref 150–400)
RBC: 4.37 MIL/uL (ref 3.87–5.11)
RDW: 16.7 % — ABNORMAL HIGH (ref 11.5–15.5)
WBC: 9.1 10*3/uL (ref 4.0–10.5)
nRBC: 0 % (ref 0.0–0.2)

## 2022-05-31 LAB — PREGNANCY, URINE: Preg Test, Ur: NEGATIVE

## 2022-05-31 MED ORDER — ONDANSETRON HCL 4 MG/2ML IJ SOLN
4.0000 mg | Freq: Once | INTRAMUSCULAR | Status: AC | PRN
  Administered 2022-05-31: 4 mg via INTRAVENOUS

## 2022-05-31 MED ORDER — SODIUM CHLORIDE 0.9 % IV BOLUS
1000.0000 mL | Freq: Once | INTRAVENOUS | Status: AC
  Administered 2022-05-31: 1000 mL via INTRAVENOUS

## 2022-05-31 MED ORDER — METOCLOPRAMIDE HCL 5 MG/ML IJ SOLN
5.0000 mg | Freq: Once | INTRAMUSCULAR | Status: AC
  Administered 2022-05-31: 5 mg via INTRAVENOUS
  Filled 2022-05-31: qty 2

## 2022-05-31 MED ORDER — ONDANSETRON HCL 4 MG/2ML IJ SOLN
INTRAMUSCULAR | Status: AC
  Filled 2022-05-31: qty 2

## 2022-05-31 MED ORDER — LIDOCAINE VISCOUS HCL 2 % MT SOLN
15.0000 mL | Freq: Once | OROMUCOSAL | Status: AC
  Administered 2022-05-31: 15 mL via OROMUCOSAL
  Filled 2022-05-31: qty 15

## 2022-05-31 MED ORDER — ONDANSETRON 4 MG PO TBDP: 4.0000 mg | ORAL_TABLET | Freq: Three times a day (TID) | ORAL | 0 refills | Status: DC | PRN

## 2022-05-31 NOTE — ED Notes (Signed)
Pt given ginger ale and ice, tolerated both well. Urine specimen obtained and sent to lab.

## 2022-05-31 NOTE — Discharge Instructions (Addendum)
Follow-up with your primary care doctor.  Come back to ER if you develop recurrent vomiting, any abdominal pain, fever or other new concerning symptom.

## 2022-05-31 NOTE — ED Triage Notes (Signed)
Pt c/o nausea and vomiting since last night after drinking too much liquor.

## 2022-05-31 NOTE — ED Provider Notes (Signed)
[facility] EMERGENCY DEPT Provider Note   CSN: [id number] Arrival date & time: 05/31/22  0950     History  Chief Complaint  Patient presents with   Nausea   Emesis    Krystal Murray is a 31 y.o. female.  Presented to Emergency Department due to concern for nausea and vomiting.  Patient reports she was out drinking last night, had approximately 5 alcoholic beverages including significant amount of hard liquor.  A couple hours after leaving the club she started feeling nauseated and has had intermittent vomiting ever since.  No abdominal pain.  Throat feels a little sore from all the vomit.  She denies any major medical problems.no fevers. '  HPI     Home Medications Prior to Admission medications   Medication Sig Start Date End Date Taking? Authorizing Provider  ondansetron (ZOFRAN-ODT) 4 MG disintegrating tablet Take 1 tablet (4 mg total) by mouth every 8 (eight) hours as needed for nausea or vomiting. 05/31/22  Yes Jash Wahlen, Ellwood Dense, MD  metroNIDAZOLE (FLAGYL) 500 MG tablet Take 1 tablet (500 mg total) by mouth 2 (two) times daily. 04/03/22   LampteyMyrene Galas, MD  terconazole (TERAZOL 3) 0.8 % vaginal cream Place 1 applicator vaginally at bedtime. Patient not taking: Reported on 06/13/2021 01/24/21   Gavin Pound, CNM      Allergies    Patient has no known allergies.    Review of Systems   Review of Systems  Constitutional:  Negative for chills and fever.  HENT:  Negative for ear pain and sore throat.   Eyes:  Negative for pain and visual disturbance.  Respiratory:  Negative for cough and shortness of breath.   Cardiovascular:  Negative for chest pain and palpitations.  Gastrointestinal:  Positive for nausea and vomiting. Negative for abdominal pain.  Genitourinary:  Negative for dysuria and hematuria.  Musculoskeletal:  Negative for arthralgias and back pain.  Skin:  Negative for color change and rash.  Neurological:  Negative for seizures and syncope.   All other systems reviewed and are negative.   Physical Exam Updated Vital Signs BP 120/75   Pulse 78   Temp 98.5 F (36.9 C)   Resp 19   Ht 5\' 7"  (1.702 m)   Wt 90.7 kg   LMP 05/21/2022   SpO2 99%   BMI 31.32 kg/m  Physical Exam Vitals and nursing note reviewed.  Constitutional:      General: She is not in acute distress.    Appearance: She is well-developed.  HENT:     Head: Normocephalic and atraumatic.  Eyes:     Conjunctiva/sclera: Conjunctivae normal.  Cardiovascular:     Rate and Rhythm: Normal rate and regular rhythm.     Heart sounds: No murmur heard. Pulmonary:     Effort: Pulmonary effort is normal. No respiratory distress.     Breath sounds: Normal breath sounds.  Abdominal:     Palpations: Abdomen is soft.     Tenderness: There is no abdominal tenderness.  Musculoskeletal:        General: No swelling.     Cervical back: Neck supple.  Skin:    General: Skin is warm and dry.     Capillary Refill: Capillary refill takes less than 2 seconds.  Neurological:     Mental Status: She is alert.  Psychiatric:        Mood and Affect: Mood normal.     ED Results / Procedures / Treatments   Labs (all labs  ordered are listed, but only abnormal results are displayed) Labs Reviewed  LIPASE, BLOOD - Abnormal; Notable for the following components:      Result Value   Lipase <10 (*)    All other components within normal limits  COMPREHENSIVE METABOLIC PANEL - Abnormal; Notable for the following components:   Total Protein 8.2 (*)    All other components within normal limits  CBC - Abnormal; Notable for the following components:   Hemoglobin 10.8 (*)    HCT 35.2 (*)    MCH 24.7 (*)    RDW 16.7 (*)    All other components within normal limits  URINALYSIS, ROUTINE W REFLEX MICROSCOPIC - Abnormal; Notable for the following components:   Ketones, ur 15 (*)    Protein, ur TRACE (*)    All other components within normal limits  PREGNANCY, URINE     EKG None  Radiology No results found.  Procedures Procedures    Medications Ordered in ED Medications  metoCLOPramide (REGLAN) injection 5 mg (has no administration in time range)  ondansetron (ZOFRAN) injection 4 mg (4 mg Intravenous Given 05/31/22 1009)  sodium chloride 0.9 % bolus 1,000 mL (0 mLs Intravenous Stopped 05/31/22 1106)  lidocaine (XYLOCAINE) 2 % viscous mouth solution 15 mL (15 mLs Mouth/Throat Given 05/31/22 1106)    ED Course/ Medical Decision Making/ A&P                           Medical Decision Making Amount and/or Complexity of Data Reviewed Labs: ordered.  Risk Prescription drug management.   31 year old lady presenting to ER due to concern for nausea vomiting after significant alcohol intoxication last night.  Check labs, no electrolyte derangement, no renal dysfunction.  Mild anemia, no leukocytosis.  Provided symptom control, fluids, antiemetics and reassess patient.  Symptoms markedly improved.  Tolerating p.o. without difficulty, no recurrent vomiting, will discharge patient home.  Suspect symptoms related to alcohol intoxication.  Spent 5 minutes counseling on alcohol cessation.    After the discussed management above, the patient was determined to be safe for discharge.  The patient was in agreement with this plan and all questions regarding their care were answered.  ED return precautions were discussed and the patient will return to the ED with any significant worsening of condition.         Final Clinical Impression(s) / ED Diagnoses Final diagnoses:  Alcohol abuse  Nausea and vomiting, unspecified vomiting type    Rx / DC Orders ED Discharge Orders          Ordered    ondansetron (ZOFRAN-ODT) 4 MG disintegrating tablet  Every 8 hours PRN        05/31/22 1337              Lucrezia Starch, MD 05/31/22 1337

## 2022-07-21 ENCOUNTER — Emergency Department (HOSPITAL_BASED_OUTPATIENT_CLINIC_OR_DEPARTMENT_OTHER)
Admission: EM | Admit: 2022-07-21 | Discharge: 2022-07-22 | Disposition: A | Discharge status: Home / Self Care | Payer: Medicaid Other | Attending: Emergency Medicine | Admitting: Emergency Medicine

## 2022-07-21 ENCOUNTER — Other Ambulatory Visit: Payer: Self-pay

## 2022-07-21 ENCOUNTER — Encounter (HOSPITAL_BASED_OUTPATIENT_CLINIC_OR_DEPARTMENT_OTHER): Payer: Self-pay | Admitting: Emergency Medicine

## 2022-07-21 DIAGNOSIS — Z87891 Personal history of nicotine dependence: Secondary | ICD-10-CM | POA: Insufficient documentation

## 2022-07-21 DIAGNOSIS — R519 Headache, unspecified: Secondary | ICD-10-CM | POA: Diagnosis not present

## 2022-07-21 DIAGNOSIS — N739 Female pelvic inflammatory disease, unspecified: Secondary | ICD-10-CM | POA: Insufficient documentation

## 2022-07-21 DIAGNOSIS — N730 Acute parametritis and pelvic cellulitis: Secondary | ICD-10-CM

## 2022-07-21 DIAGNOSIS — R1033 Periumbilical pain: Secondary | ICD-10-CM | POA: Diagnosis present

## 2022-07-21 LAB — CBC WITH DIFFERENTIAL/PLATELET
Abs Immature Granulocytes: 0.04 10*3/uL (ref 0.00–0.07)
Basophils Absolute: 0 10*3/uL (ref 0.0–0.1)
Basophils Relative: 0 %
Eosinophils Absolute: 0 10*3/uL (ref 0.0–0.5)
Eosinophils Relative: 0 %
HCT: 30.4 % — ABNORMAL LOW (ref 36.0–46.0)
Hemoglobin: 9.8 g/dL — ABNORMAL LOW (ref 12.0–15.0)
Immature Granulocytes: 0 %
Lymphocytes Relative: 11 %
Lymphs Abs: 1.3 10*3/uL (ref 0.7–4.0)
MCH: 25.8 pg — ABNORMAL LOW (ref 26.0–34.0)
MCHC: 32.2 g/dL (ref 30.0–36.0)
MCV: 80 fL (ref 80.0–100.0)
Monocytes Absolute: 1.1 10*3/uL — ABNORMAL HIGH (ref 0.1–1.0)
Monocytes Relative: 9 %
Neutro Abs: 9.6 10*3/uL — ABNORMAL HIGH (ref 1.7–7.7)
Neutrophils Relative %: 80 %
Platelets: 218 10*3/uL (ref 150–400)
RBC: 3.8 MIL/uL — ABNORMAL LOW (ref 3.87–5.11)
RDW: 15.5 % (ref 11.5–15.5)
WBC: 12.1 10*3/uL — ABNORMAL HIGH (ref 4.0–10.5)
nRBC: 0 % (ref 0.0–0.2)

## 2022-07-21 LAB — URINALYSIS, ROUTINE W REFLEX MICROSCOPIC
Bilirubin Urine: NEGATIVE
Glucose, UA: NEGATIVE mg/dL
Hgb urine dipstick: NEGATIVE
Ketones, ur: 40 mg/dL — AB
Nitrite: NEGATIVE
Protein, ur: NEGATIVE mg/dL
Specific Gravity, Urine: 1.019 (ref 1.005–1.030)
pH: 7.5 (ref 5.0–8.0)

## 2022-07-21 LAB — HCG, SERUM, QUALITATIVE: Preg, Serum: NEGATIVE

## 2022-07-21 MED ORDER — METOCLOPRAMIDE HCL 5 MG/ML IJ SOLN
10.0000 mg | Freq: Once | INTRAMUSCULAR | Status: AC
  Administered 2022-07-21: 10 mg via INTRAVENOUS
  Filled 2022-07-21: qty 2

## 2022-07-21 MED ORDER — FENTANYL CITRATE PF 50 MCG/ML IJ SOSY
100.0000 ug | PREFILLED_SYRINGE | Freq: Once | INTRAMUSCULAR | Status: AC
  Administered 2022-07-21: 100 ug via INTRAVENOUS
  Filled 2022-07-21: qty 2

## 2022-07-21 MED ORDER — SODIUM CHLORIDE 0.9 % IV BOLUS
1000.0000 mL | Freq: Once | INTRAVENOUS | Status: AC
  Administered 2022-07-21: 1000 mL via INTRAVENOUS

## 2022-07-21 NOTE — ED Triage Notes (Signed)
Ems vs: 160/92,  106, 14 Cbg 109  Abdominal and headache started about 5 hours ago.did not take anything otc to help with symptoms. Vomiting yesterday .

## 2022-07-21 NOTE — ED Provider Notes (Signed)
[facility] Provider Note: Krystal Dell, MD, FACEP  CSN: [id number] MRN: [id number] ARRIVAL: 07/21/22 at 2247 ROOM: DB016/DB016   CHIEF COMPLAINT  Abdominal Pain   HISTORY OF PRESENT ILLNESS  07/21/22 11:07 PM Krystal Murray is a 31 y.o. female who began having periumbilical pain yesterday.  The onset was gradual and has been worsening.  She rates the pain as a 10 out of 10 currently.  She has had associated nausea and vomiting and estimates she has vomited 3 times.  She has not been able to have a bowel movement and attempting to move her bowels exacerbates her pain.  She is not aware of having a fever.  She has an associated headache which is on the left side of her head and described as shooting in nature.  She states she has not had anything to eat or drink all day.   Past Medical History:  Diagnosis Date   UTI (lower urinary tract infection)     Past Surgical History:  Procedure Laterality Date   CESAREAN SECTION N/A 02/07/2018   Procedure: CESAREAN SECTION;  Surgeon: Hermina Staggers, MD;  Location: [facility];  Service: Obstetrics;  Laterality: N/A;   NO PAST SURGERIES      Family History  Problem Relation Age of Onset   Cancer Maternal Grandmother        breastt   Cancer Maternal Aunt        uterine   Diabetes Maternal Aunt    Hypertension Maternal Aunt    Cancer Cousin        leaukemia    Social History   Tobacco Use   Smoking status: Former    Types: Cigarettes    Quit date: 05/2017    Years since quitting: 5.1   Smokeless tobacco: Never  Vaping Use   Vaping Use: Never used  Substance Use Topics   Alcohol use: Yes   Drug use: No    Prior to Admission medications   Medication Sig Start Date End Date Taking? Authorizing Provider  metroNIDAZOLE (FLAGYL) 500 MG tablet Take 1 tablet (500 mg total) by mouth 2 (two) times daily. 04/03/22   Lamptey, Britta Mccreedy, MD  ondansetron (ZOFRAN-ODT) 4 MG disintegrating tablet Take 1 tablet (4 mg  total) by mouth every 8 (eight) hours as needed for nausea or vomiting. 05/31/22   Milagros Loll, MD  terconazole (TERAZOL 3) 0.8 % vaginal cream Place 1 applicator vaginally at bedtime. Patient not taking: Reported on 06/13/2021 01/24/21   Gerrit Heck, CNM    Allergies Patient has no known allergies.   REVIEW OF SYSTEMS  Negative except as noted here or in the History of Present Illness.   PHYSICAL EXAMINATION  Initial Vital Signs Blood pressure (!) 147/100, pulse 90, temperature 98.9 F (37.2 C), temperature source Oral, resp. rate 18, SpO2 100 %, currently breastfeeding.  Examination General: Well-developed, well-nourished female in no acute distress; appearance consistent with age of record HENT: normocephalic; atraumatic Eyes: Normal appearance Neck: supple Heart: regular rate and rhythm Lungs: clear to auscultation bilaterally Abdomen: soft; nondistended; periumbilical tenderness; bowel sounds present Extremities: No deformity; full range of motion; pulses normal Neurologic: Awake, alert and oriented; motor function intact in all extremities and symmetric; no facial droop Skin: Warm and dry Psychiatric: Normal mood and affect   RESULTS  Summary of this visit's results, reviewed and interpreted by myself:   EKG Interpretation  Date/Time:    Ventricular Rate:    PR Interval:  QRS Duration:   QT Interval:    QTC Calculation:   R Axis:     Text Interpretation:         Laboratory Studies: Results for orders placed or performed during the hospital encounter of 07/21/22 (from the past 24 hour(s))  Urinalysis, Routine w reflex microscopic Urine, Clean Catch     Status: Abnormal   Collection Time: 07/21/22 10:59 PM  Result Value Ref Range   Color, Urine YELLOW YELLOW   APPearance CLEAR CLEAR   Specific Gravity, Urine 1.019 1.005 - 1.030   pH 7.5 5.0 - 8.0   Glucose, UA NEGATIVE NEGATIVE mg/dL   Hgb urine dipstick NEGATIVE NEGATIVE   Bilirubin Urine  NEGATIVE NEGATIVE   Ketones, ur 40 (A) NEGATIVE mg/dL   Protein, ur NEGATIVE NEGATIVE mg/dL   Nitrite NEGATIVE NEGATIVE   Leukocytes,Ua MODERATE (A) NEGATIVE   RBC / HPF 0-5 0 - 5 RBC/hpf   WBC, UA 0-5 0 - 5 WBC/hpf   Squamous Epithelial / LPF 0-5 0 - 5   Mucus PRESENT    Hyaline Casts, UA PRESENT   CBC with Differential     Status: Abnormal   Collection Time: 07/21/22 11:08 PM  Result Value Ref Range   WBC 12.1 (H) 4.0 - 10.5 K/uL   RBC 3.80 (L) 3.87 - 5.11 MIL/uL   Hemoglobin 9.8 (L) 12.0 - 15.0 g/dL   HCT 09.3 (L) 81.8 - 29.9 %   MCV 80.0 80.0 - 100.0 fL   MCH 25.8 (L) 26.0 - 34.0 pg   MCHC 32.2 30.0 - 36.0 g/dL   RDW 37.1 69.6 - 78.9 %   Platelets 218 150 - 400 K/uL   nRBC 0.0 0.0 - 0.2 %   Neutrophils Relative % 80 %   Neutro Abs 9.6 (H) 1.7 - 7.7 K/uL   Lymphocytes Relative 11 %   Lymphs Abs 1.3 0.7 - 4.0 K/uL   Monocytes Relative 9 %   Monocytes Absolute 1.1 (H) 0.1 - 1.0 K/uL   Eosinophils Relative 0 %   Eosinophils Absolute 0.0 0.0 - 0.5 K/uL   Basophils Relative 0 %   Basophils Absolute 0.0 0.0 - 0.1 K/uL   Immature Granulocytes 0 %   Abs Immature Granulocytes 0.04 0.00 - 0.07 K/uL   Imaging Studies: No results found.  ED COURSE and MDM  Nursing notes, initial and subsequent vitals signs, including pulse oximetry, reviewed and interpreted by myself.  Vitals:   07/21/22 2256  BP: (!) 147/100  Pulse: 90  Resp: 18  Temp: 98.9 F (37.2 C)  TempSrc: Oral  SpO2: 100%   Medications  metoCLOPramide (REGLAN) injection 10 mg (10 mg Intravenous Given 07/21/22 2339)  fentaNYL (SUBLIMAZE) injection 100 mcg (100 mcg Intravenous Given 07/21/22 2339)  sodium chloride 0.9 % bolus 1,000 mL (1,000 mLs Intravenous New Bag/Given 07/21/22 2338)      PROCEDURES  Procedures   ED DIAGNOSES  No diagnosis found.

## 2022-07-22 ENCOUNTER — Encounter (HOSPITAL_BASED_OUTPATIENT_CLINIC_OR_DEPARTMENT_OTHER): Payer: Self-pay | Admitting: Radiology

## 2022-07-22 ENCOUNTER — Emergency Department (HOSPITAL_BASED_OUTPATIENT_CLINIC_OR_DEPARTMENT_OTHER): Payer: Medicaid Other

## 2022-07-22 LAB — COMPREHENSIVE METABOLIC PANEL
ALT: 15 U/L (ref 0–44)
AST: 16 U/L (ref 15–41)
Albumin: 4.2 g/dL (ref 3.5–5.0)
Alkaline Phosphatase: 45 U/L (ref 38–126)
Anion gap: 10 (ref 5–15)
BUN: 10 mg/dL (ref 6–20)
CO2: 24 mmol/L (ref 22–32)
Calcium: 8.8 mg/dL — ABNORMAL LOW (ref 8.9–10.3)
Chloride: 104 mmol/L (ref 98–111)
Creatinine, Ser: 0.74 mg/dL (ref 0.44–1.00)
GFR, Estimated: >60 mL/min (ref >60)
Glucose, Bld: 98 mg/dL (ref 70–99)
Potassium: 3.3 mmol/L — ABNORMAL LOW (ref 3.5–5.1)
Sodium: 138 mmol/L (ref 135–145)
Total Bilirubin: 1 mg/dL (ref 0.3–1.2)
Total Protein: 7 g/dL (ref 6.5–8.1)

## 2022-07-22 LAB — WET PREP, GENITAL
Sperm: NONE SEEN
Trich, Wet Prep: NONE SEEN
WBC, Wet Prep HPF POC: >=10 — AB (ref <10)
Yeast Wet Prep HPF POC: NONE SEEN

## 2022-07-22 LAB — LIPASE, BLOOD: Lipase: <10 U/L — ABNORMAL LOW (ref 11–51)

## 2022-07-22 MED ORDER — FENTANYL CITRATE PF 50 MCG/ML IJ SOSY
100.0000 ug | PREFILLED_SYRINGE | Freq: Once | INTRAMUSCULAR | Status: AC
  Administered 2022-07-22: 100 ug via INTRAVENOUS
  Filled 2022-07-22: qty 2

## 2022-07-22 MED ORDER — IOHEXOL 300 MG/ML  SOLN
100.0000 mL | Freq: Once | INTRAMUSCULAR | Status: AC | PRN
  Administered 2022-07-22: 100 mL via INTRAVENOUS

## 2022-07-22 MED ORDER — FLUCONAZOLE 150 MG PO TABS: ORAL_TABLET | ORAL | 0 refills | Status: DC

## 2022-07-22 MED ORDER — AZITHROMYCIN 1 G PO PACK: PACK | ORAL | 0 refills | Status: DC

## 2022-07-22 MED ORDER — SODIUM CHLORIDE 0.9 % IV SOLN
1.0000 g | Freq: Once | INTRAVENOUS | Status: AC
  Administered 2022-07-22: 1 g via INTRAVENOUS
  Filled 2022-07-22: qty 10

## 2022-07-22 MED ORDER — MORPHINE SULFATE (PF) 4 MG/ML IV SOLN
4.0000 mg | Freq: Once | INTRAVENOUS | Status: AC
  Administered 2022-07-22: 4 mg via INTRAVENOUS
  Filled 2022-07-22: qty 1

## 2022-07-22 MED ORDER — AZITHROMYCIN 1 G PO PACK
1.0000 g | PACK | Freq: Once | ORAL | Status: AC
  Administered 2022-07-22: 1 g via ORAL
  Filled 2022-07-22: qty 1

## 2022-07-22 MED ORDER — OXYCODONE-ACETAMINOPHEN 10-325 MG PO TABS: 1.0000 | ORAL_TABLET | Freq: Four times a day (QID) | ORAL | 0 refills | Status: DC | PRN

## 2022-07-22 NOTE — ED Notes (Signed)
Patient transported to CT 

## 2022-07-22 NOTE — ED Notes (Signed)
Pt walked out of room ambulatory without assistance to state 10/10 pain, md notified.

## 2022-07-24 LAB — GC/CHLAMYDIA PROBE AMP (~~LOC~~) NOT AT ARMC
Chlamydia: POSITIVE — AB
Comment: NEGATIVE
Comment: NORMAL
Neisseria Gonorrhea: POSITIVE — AB

## 2022-08-21 ENCOUNTER — Ambulatory Visit: Payer: Medicaid Other | Admitting: Student

## 2022-09-26 ENCOUNTER — Ambulatory Visit (HOSPITAL_COMMUNITY): Payer: Self-pay

## 2022-09-28 ENCOUNTER — Encounter (HOSPITAL_BASED_OUTPATIENT_CLINIC_OR_DEPARTMENT_OTHER): Payer: Self-pay | Admitting: Emergency Medicine

## 2022-09-28 ENCOUNTER — Inpatient Hospital Stay (HOSPITAL_COMMUNITY)
Admission: RE | Admit: 2022-09-28 | Discharge: 2022-09-28 | Disposition: A | Discharge status: Home / Self Care | Payer: Medicaid Other | Source: Ambulatory Visit

## 2022-09-28 ENCOUNTER — Encounter (HOSPITAL_COMMUNITY): Payer: Self-pay

## 2022-09-28 ENCOUNTER — Ambulatory Visit (HOSPITAL_COMMUNITY)
Admission: RE | Admit: 2022-09-28 | Discharge: 2022-09-28 | Disposition: A | Discharge status: Home / Self Care | Payer: Medicaid Other | Source: Ambulatory Visit | Attending: Emergency Medicine | Admitting: Emergency Medicine

## 2022-09-28 ENCOUNTER — Other Ambulatory Visit: Payer: Self-pay

## 2022-09-28 ENCOUNTER — Emergency Department (HOSPITAL_BASED_OUTPATIENT_CLINIC_OR_DEPARTMENT_OTHER)
Admission: EM | Admit: 2022-09-28 | Discharge: 2022-09-28 | Disposition: A | Discharge status: Home / Self Care | Payer: Medicaid Other | Attending: Emergency Medicine | Admitting: Emergency Medicine

## 2022-09-28 VITALS — BP 135/93 | HR 71 | Temp 98.6°F | Resp 16

## 2022-09-28 DIAGNOSIS — B9689 Other specified bacterial agents as the cause of diseases classified elsewhere: Secondary | ICD-10-CM

## 2022-09-28 DIAGNOSIS — Z711 Person with feared health complaint in whom no diagnosis is made: Secondary | ICD-10-CM

## 2022-09-28 DIAGNOSIS — Z3201 Encounter for pregnancy test, result positive: Secondary | ICD-10-CM | POA: Diagnosis not present

## 2022-09-28 DIAGNOSIS — N898 Other specified noninflammatory disorders of vagina: Secondary | ICD-10-CM

## 2022-09-28 DIAGNOSIS — O23599 Infection of other part of genital tract in pregnancy, unspecified trimester: Secondary | ICD-10-CM | POA: Diagnosis not present

## 2022-09-28 DIAGNOSIS — O26891 Other specified pregnancy related conditions, first trimester: Secondary | ICD-10-CM | POA: Diagnosis present

## 2022-09-28 LAB — WET PREP, GENITAL
Sperm: NONE SEEN
Trich, Wet Prep: NONE SEEN
WBC, Wet Prep HPF POC: <10
Yeast Wet Prep HPF POC: NONE SEEN

## 2022-09-28 LAB — POCT URINALYSIS DIPSTICK, ED / UC
Bilirubin Urine: NEGATIVE
Glucose, UA: NEGATIVE mg/dL
Ketones, ur: 15 mg/dL — AB
Leukocytes,Ua: NEGATIVE
Nitrite: NEGATIVE
Protein, ur: 30 mg/dL — AB
Specific Gravity, Urine: 1.025 (ref 1.005–1.030)
Urobilinogen, UA: 0.2 mg/dL (ref 0.0–1.0)
pH: 6 (ref 5.0–8.0)

## 2022-09-28 LAB — POC URINE PREG, ED: Preg Test, Ur: POSITIVE — AB

## 2022-09-28 MED ORDER — METRONIDAZOLE 500 MG PO TABS: 500.0000 mg | ORAL_TABLET | Freq: Two times a day (BID) | ORAL | 0 refills | Status: DC

## 2022-09-28 MED ORDER — AZITHROMYCIN 250 MG PO TABS
1000.0000 mg | ORAL_TABLET | Freq: Once | ORAL | Status: AC
  Administered 2022-09-28: 1000 mg via ORAL
  Filled 2022-09-28: qty 4

## 2022-09-28 MED ORDER — LIDOCAINE HCL (PF) 1 % IJ SOLN
INTRAMUSCULAR | Status: AC
  Administered 2022-09-28: 5 mL
  Filled 2022-09-28: qty 5

## 2022-09-28 MED ORDER — CEFTRIAXONE SODIUM 500 MG IJ SOLR
500.0000 mg | Freq: Once | INTRAMUSCULAR | Status: AC
  Administered 2022-09-28: 500 mg via INTRAMUSCULAR
  Filled 2022-09-28: qty 500

## 2022-09-28 MED ORDER — VITAMIN B-6 25 MG PO TABS: 25.0000 mg | ORAL_TABLET | Freq: Every day | ORAL | 0 refills | Status: DC

## 2022-09-28 NOTE — ED Triage Notes (Addendum)
Pt arrive sto ED with c/o lower abdominal pain. This started about two weeks ago. She reports being dx with both chlamydia and gonorrhea x2 months ago but ha shad continued white thick discharge. Pt also reports that she took an at home pregnancy test which showed she was pregnant. She was seen in UC this morning.

## 2022-09-28 NOTE — ED Triage Notes (Signed)
Patient having abdominal pain. Onset a few weeks. No urinary symptoms, Patient having normal BM. Patient is 2 weeks late for her period. LMP was around 08/09/22.

## 2022-09-28 NOTE — ED Provider Notes (Signed)
[facility]    CSN: [id number] Arrival date & time: 09/28/22  4098      History   Chief Complaint Chief Complaint  Patient presents with   Abdominal Pain   Nausea    HPI Krystal Murray is a 31 y.o. female.  Patient complaining of bilateral lower abdominal pain x1 week.  Patient endorses nausea at times.  Patient reports having last bowel movement last night and it was normal.  Patient denies diarrhea, constipation, or vomiting.  Patient reports history of BV.  Patient reports increase in vaginal discharge.  Patient denies vaginal itching or dyspareunia.  Patient denies abnormal vaginal odor.  Patient reports that she is currently sexually active with no condom use or birth control use.  Patient's last menstrual period was 08/17/2022 per patient statement.   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 Authorizing Provider  pyridOXINE (VITAMIN  B6) 25 MG tablet Take 1 tablet (25 mg total) by mouth daily. 09/28/22  Yes Debby Freiberg, NP  azithromycin (ZITHROMAX) 1 g powder Take on Wednesday, 07/29/2022. 07/22/22   Molpus, John, MD  fluconazole (DIFLUCAN) 150 MG tablet Take 1 tablet as needed for vaginal yeast infection.  May repeat in 3 days if symptoms persist. 07/22/22   Molpus, Jonny Ruiz, MD  oxyCODONE-acetaminophen (PERCOCET) 10-325 MG tablet Take 1 tablet by mouth every 6 (six) hours as needed for pain. 07/22/22   Molpus, John, MD    Family History Family History  Problem Relation Age of Onset   Cancer Maternal Grandmother        breastt   Cancer Maternal Aunt        uterine   Diabetes Maternal Aunt    Hypertension Maternal Aunt    Cancer Cousin        leaukemia    Social History Social History   Tobacco Use   Smoking status: Former    Types: Cigarettes    Quit date: 05/2017    Years since quitting: 5.3   Smokeless tobacco: Never  Vaping Use   Vaping Use: Never used  Substance Use Topics   Alcohol use: Yes   Drug use: No     Allergies  Patient has no known allergies.   Review of Systems Review of Systems  Constitutional:  Negative for activity change, appetite change, chills, fatigue and fever.  Respiratory:  Negative for cough and shortness of breath.   Cardiovascular: Negative.   Gastrointestinal:  Positive for abdominal pain and nausea. Negative for abdominal distention, constipation, diarrhea, rectal pain and vomiting.  Genitourinary:  Positive for menstrual problem (Missed period) and vaginal discharge (Increased discharge). Negative for decreased urine volume, difficulty urinating, dyspareunia, dysuria, flank pain, frequency, hematuria, vaginal bleeding and vaginal pain.  Musculoskeletal:  Negative for back pain.     Physical Exam Triage Vital Signs ED Triage Vitals  Enc Vitals Group     BP 09/28/22 0904 (!) 135/93     Pulse Rate 09/28/22 0904 71     Resp 09/28/22 0904 16     Temp 09/28/22 0904  98.6 F (37 C)     Temp Source 09/28/22 0904 Oral     SpO2 09/28/22 0904 98 %     Weight --      Height --      Head Circumference --      Peak Flow --      Pain Score 09/28/22 0906 7     Pain Loc --      Pain Edu? --      Excl. in [place]? --    No data found.  Updated Vital Signs BP (!) 135/93 (BP Location: Right Arm)   Pulse 71   Temp 98.6 F (37 C) (Oral)   Resp 16   LMP 08/17/2022 (Approximate)   SpO2 98%   Breastfeeding Yes      Physical Exam Vitals and nursing note reviewed.  Constitutional:      Appearance: She is well-developed.  Cardiovascular:     Rate and Rhythm: Normal rate and regular rhythm.     Heart sounds: Normal heart sounds.  Pulmonary:     Effort: Pulmonary effort is normal.     Breath sounds: Normal breath sounds.  Abdominal:     General: Bowel sounds are normal. There is no distension.     Palpations: Abdomen is soft. There is no hepatomegaly or splenomegaly.     Tenderness: There is generalized abdominal tenderness. There is no right CVA tenderness, left CVA tenderness, guarding or rebound. Negative signs include Murphy's sign and McBurney's sign.  Genitourinary:    Adnexa:        Right: No tenderness.         Left: No tenderness.    Neurological:     Mental Status: She is alert.      UC Treatments / Results  Labs (all labs ordered are listed, but only abnormal results are displayed) Labs Reviewed  POCT URINALYSIS DIPSTICK, ED / UC - Abnormal; Notable for the following components:      Result Value   Ketones, ur 15 (*)    Hgb urine dipstick TRACE (*)    Protein, ur 30 (*)    All other components within normal limits  POC URINE PREG, ED - Abnormal; Notable for the following components:   Preg Test, Ur POSITIVE (*)    All other components within normal limits    EKG   Radiology No results found.  Procedures Procedures (including critical care time)  Medications Ordered in UC Medications - No data to display  Initial  Impression / Assessment and Plan / UC Course  I have reviewed the triage vital signs and the nursing notes.  Pertinent labs & imaging results that were available during my care of the patient were reviewed by me and considered in my medical decision making (see chart for details).     Patient had positive pregnancy test today.  Patient was made aware to schedule a OB/GYN appointment.  Upon exam patient's abdominal pain appears generalized and does not seem emergent.  Patient was made aware to go to the emergency department if severe abdominal pain, vaginal bleeding, or uncontrolled vomiting occurred.  Vitamin B6 prescription was sent to the pharmacy to help with morning sickness.  Patient was made aware to maintain adequate diet and fluid intake.  Patient verbalized understanding of instructions. Final Clinical Impressions(s) / UC Diagnoses   Final diagnoses:  Positive pregnancy test     Discharge Instructions      Your pregnancy test was positive.  Please go ahead and schedule a visit with your OB/GYN.  I have sent vitamin B6 to the pharmacy to help with nausea and morning sickness.  You may take 1 tablet daily.  If you begin having severe abdominal pain, vaginal bleeding, or uncontrolled vomiting you will need to go to your nearest emergency department.      ED Prescriptions     Medication Sig Dispense Auth. Provider   pyridOXINE (VITAMIN B6) 25 MG tablet Take 1 tablet (25 mg total) by mouth daily. 30 tablet Debby Freiberg, NP      PDMP not reviewed this encounter.   Debby Freiberg, NP 09/28/22 [phone]

## 2022-09-28 NOTE — ED Provider Notes (Addendum)
[facility] EMERGENCY DEPT Provider Note   CSN: [id number] Arrival date & time: 09/28/22  1038     History  Chief Complaint  Patient presents with   Abdominal Pain    Krystal Murray is a 31 y.o. 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   Patient is marked for merge with another chart, MRN: [id number].  Patient was seen in urgent care this morning complaining of bilateral lower abdominal pain for approximately 1 week, associated with nausea at times.  Patient also reported an increase in vaginal discharge with a history of STDs.  She denies vaginal itching, dyspareunia, burning, or bleeding.  Patient is currently sexually active with 1 partner with no condom or birth control use and her last menstrual period was 08/17/2022. She was tested for pregnancy which was positive and was told to schedule an OB/GYN appointment.  Patient states that she did not have a pelvic exam or swabs this morning and came to the emergency department here because she is afraid she has an STD.  Patient has a history of STDs in  pregnancy and she states in August she had a history of an STD (per chart review,  August 2023 PID positive for both gonorrhea/chlamydia as well as BV and was treated with abx), and she completed her treatment but her partner never got treated.  She says the vaginal discharge is white and thick and the same as it was before when she had an STD.  She states she is worried that he gave it back to her. She has had no fevers/chills, and her abdominal pain now is better than when she went to urgent care earlier today.  Denies any urinary symptoms such as dysuria/hematuria, no diarrhea constipation, no vaginal rash fluid or bleeding.  This is her seventh pregnancy, she has 4 living children who were born 2 by vaginal delivery and 2 by C-section, 1 miscarriage and 1 elective abortion.  Pregnancies were uncomplicated and  delivered healthy at term.   Abdominal Pain      Home Medications Prior to Admission medications   Medication Sig Start Date End Date Taking? Authorizing Provider  metroNIDAZOLE (FLAGYL) 500 MG tablet Take 1 tablet (500 mg total) by mouth 2 (two) times daily. 09/28/22  Yes Loetta Rough, MD      Allergies    Patient has no known allergies.    Review of Systems   Review of Systems  Gastrointestinal:  Positive for abdominal pain.   Review of systems negative for f/c.  A 10 point review of systems was performed and is negative unless otherwise reported in HPI.  Physical Exam Updated Vital Signs BP 125/80   Pulse 80   Temp 98.3 F (36.8 C) (Oral)   Resp 16   Ht 5\' 8"  (1.727 m)   Wt 90.7 kg   SpO2 100%   BMI 30.41 kg/m  Physical Exam General: Normal appearing female, lying in bed.  HEENT: PERRLA, Sclera anicteric, MMM, trachea midline. Cardiology: RRR, no murmurs/rubs/gallops. BL radial and DP pulses equal bilaterally.  Resp: Normal respiratory rate and effort. CTAB, no wheezes, rhonchi, crackles.  Abd: Soft, non-tender, non-distended. No rebound tenderness or guarding.  Pelvic: Normal-appearing external genitalia.  Thick creamy white vaginal discharge in vaginal vault.  Cervix is without erythema or lesions.  No discharge noted from the cervical os.  Cervical os is closed.  No cervical motion tenderness on exam.  No adnexal tenderness on  bimanual exam. MSK: No peripheral edema or signs of trauma. Extremities without deformity or TTP. No cyanosis or clubbing. Skin: warm, dry. No rashes or lesions. Back: No CVA tenderness Neuro: A&Ox4, CNs II-XII grossly intact. MAEs. Sensation grossly intact.  Psych: Normal mood and affect.   ED Results / Procedures / Treatments   Labs (all labs ordered are listed, but only abnormal results are displayed) Labs Reviewed  WET PREP, GENITAL - Abnormal; Notable for the following components:      Result Value   Clue Cells Wet Prep HPF POC  PRESENT (*)    All other components within normal limits  GC/CHLAMYDIA PROBE AMP ([facility]) NOT AT [facility]   Procedures Procedures    Medications Ordered in ED Medications  cefTRIAXone (ROCEPHIN) injection 500 mg (500 mg Intramuscular Given 09/28/22 1237)  azithromycin (ZITHROMAX) tablet 1,000 mg (1,000 mg Oral Given 09/28/22 1236)  lidocaine (PF) (XYLOCAINE) 1 % injection (5 mLs  Given 09/28/22 1238)    ED Course/ Medical Decision Making/ A&P                          Medical Decision Making Amount and/or Complexity of Data Reviewed Labs: ordered. Decision-making details documented in ED Course.  Risk Prescription drug management.    Consider cervicitis or vaginitis given patient's history. Pregnancy is relatively protective against PID but will perform pelvic exam to assess. Patient states her abdominal pain is improved at this time and abdominal exam is very benign.  Very low concern for spontaneous abortion as well given that she has had no vaginal bleeding and her abdominal pain is improved.  Her larger concern at this time is her vaginal symptoms and concerned she has an STD.  No urinary symptoms to suggest urethritis or UTI.  Patient's pelvic exam with thick white vaginal discharge with no cervical motion tenderness and no discharge coming from os, os is closed.  Differential includes vaginitis versus cervicitis.  Given untreated partner with a possible STD and symptoms consistent with her prior STDs, will treat patient with ceftriaxone 500 IM injection as well as azithromycin 1 g p.o. given her pregnancy.   I have personally reviewed and interpreted all labs.    Patient 's abdominal exam very benign, she is very well-appearing and is ready to be discharged.  Patient's wet prep is positive for clue cells, and given her symptoms we will treat with metronidazole 500 mg twice daily x 7 days for BV.  Patient understands that her GC result will not come back for a day or 2 but that  she has been treated. Patient is advised that she needs to have her partner treated for gonorrhea/chlamydia or they will continue to pass the infection back and forth.  Patient is advised not to have unprotected intercourse with her partner until he is treated.  Patient follow-up with her OB physician and is given discharge instructions and return precautions.  All questions answered to patient's satisfaction.   Clinical Course as of 09/28/22 1321  Mon Sep 28, 2022  1310 Clue Cells Wet Prep HPF POC(!): PRESENT [HN]    Clinical Course User Index [HN] Loetta Rough, MD    Dispo: DC        Final Clinical Impression(s) / ED Diagnoses Final diagnoses:  Vaginal discharge during pregnancy in first trimester  Concern about STD in female without diagnosis  Bacterial vaginosis in pregnancy    Rx / DC Orders ED Discharge Orders  Ordered    metroNIDAZOLE (FLAGYL) 500 MG tablet  2 times daily        09/28/22 1320             This note was created using dictation software, which may contain spelling or grammatical errors.    Audley Hose, MD 09/28/22 1322    Audley Hose, MD 09/28/22 1322

## 2022-09-28 NOTE — ED Notes (Signed)
Discharge instructions, follow up care, and prescription reviewed and explained, pt verbalized understanding. Pt verbalized understanding and had no further questions on d/c.

## 2022-09-28 NOTE — Discharge Instructions (Addendum)
Your pregnancy test was positive.  Please go ahead and schedule a visit with your OB/GYN.  I have sent vitamin B6 to the pharmacy to help with nausea and morning sickness.  You may take 1 tablet daily.  If you begin having severe abdominal pain, vaginal bleeding, or uncontrolled vomiting you will need to go to your nearest emergency department.

## 2022-09-28 NOTE — Discharge Instructions (Addendum)
Thank you for coming to [facility] Emergency Department. You were seen for . We did an exam, labs, and imaging, and these showed discharge from your vagina.  Given that you recently were probably exposed to an STD, we have treated you with antibiotics for gonorrhea/committee a presumptively though the tests have not resulted yet.  You also tested positive for bacterial vaginosis so we will prescribe metronidazole 500 mg to take twice per day for 1 week.  These antibiotics are safe in pregnancy.  Your partner needs to be treated for STDs as well.  Please have him see urgent care or his primary care physician as soon as possible.  Do not have unprotected sex with him until he is treated.  Please follow up with your primary care provider within 1 week.   Do not hesitate to return to the ED or call 911 if you experience: -Worsening symptoms -Gush of vaginal fluid, vaginal bleeding -Severe or worsening abdominal pain -Lightheadedness, passing out -Fevers/chills -Anything else that concerns you

## 2022-09-29 LAB — GC/CHLAMYDIA PROBE AMP (~~LOC~~) NOT AT ARMC
Chlamydia: NEGATIVE
Comment: NEGATIVE
Comment: NORMAL
Neisseria Gonorrhea: NEGATIVE

## 2022-10-07 ENCOUNTER — Encounter (HOSPITAL_BASED_OUTPATIENT_CLINIC_OR_DEPARTMENT_OTHER): Payer: Self-pay

## 2022-10-07 ENCOUNTER — Emergency Department (HOSPITAL_BASED_OUTPATIENT_CLINIC_OR_DEPARTMENT_OTHER)
Admission: EM | Admit: 2022-10-07 | Discharge: 2022-10-07 | Disposition: A | Discharge status: Home / Self Care | Payer: Medicaid Other | Attending: Emergency Medicine | Admitting: Emergency Medicine

## 2022-10-07 ENCOUNTER — Other Ambulatory Visit: Payer: Self-pay

## 2022-10-07 DIAGNOSIS — O219 Vomiting of pregnancy, unspecified: Secondary | ICD-10-CM | POA: Insufficient documentation

## 2022-10-07 DIAGNOSIS — Z3A01 Less than 8 weeks gestation of pregnancy: Secondary | ICD-10-CM | POA: Diagnosis not present

## 2022-10-07 LAB — CBC WITH DIFFERENTIAL/PLATELET
Abs Immature Granulocytes: 0.01 10*3/uL (ref 0.00–0.07)
Basophils Absolute: 0 10*3/uL (ref 0.0–0.1)
Basophils Relative: 0 %
Eosinophils Absolute: 0.1 10*3/uL (ref 0.0–0.5)
Eosinophils Relative: 1 %
HCT: 37.7 % (ref 36.0–46.0)
Hemoglobin: 11.9 g/dL — ABNORMAL LOW (ref 12.0–15.0)
Immature Granulocytes: 0 %
Lymphocytes Relative: 33 %
Lymphs Abs: 2.2 10*3/uL (ref 0.7–4.0)
MCH: 25.5 pg — ABNORMAL LOW (ref 26.0–34.0)
MCHC: 31.6 g/dL (ref 30.0–36.0)
MCV: 80.7 fL (ref 80.0–100.0)
Monocytes Absolute: 0.7 10*3/uL (ref 0.1–1.0)
Monocytes Relative: 10 %
Neutro Abs: 3.7 10*3/uL (ref 1.7–7.7)
Neutrophils Relative %: 56 %
Platelets: 194 10*3/uL (ref 150–400)
RBC: 4.67 MIL/uL (ref 3.87–5.11)
RDW: 16 % — ABNORMAL HIGH (ref 11.5–15.5)
WBC: 6.6 10*3/uL (ref 4.0–10.5)
nRBC: 0 % (ref 0.0–0.2)

## 2022-10-07 LAB — COMPREHENSIVE METABOLIC PANEL
ALT: 11 U/L (ref 0–44)
AST: 14 U/L — ABNORMAL LOW (ref 15–41)
Albumin: 4.4 g/dL (ref 3.5–5.0)
Alkaline Phosphatase: 33 U/L — ABNORMAL LOW (ref 38–126)
Anion gap: 8 (ref 5–15)
BUN: 8 mg/dL (ref 6–20)
CO2: 24 mmol/L (ref 22–32)
Calcium: 9.6 mg/dL (ref 8.9–10.3)
Chloride: 103 mmol/L (ref 98–111)
Creatinine, Ser: 0.67 mg/dL (ref 0.44–1.00)
GFR, Estimated: >60 mL/min (ref >60)
Glucose, Bld: 87 mg/dL (ref 70–99)
Potassium: 3.8 mmol/L (ref 3.5–5.1)
Sodium: 135 mmol/L (ref 135–145)
Total Bilirubin: 0.5 mg/dL (ref 0.3–1.2)
Total Protein: 7.5 g/dL (ref 6.5–8.1)

## 2022-10-07 LAB — LIPASE, BLOOD: Lipase: <10 U/L — ABNORMAL LOW (ref 11–51)

## 2022-10-07 MED ORDER — METOCLOPRAMIDE HCL 10 MG PO TABS: 10.0000 mg | ORAL_TABLET | Freq: Three times a day (TID) | ORAL | 0 refills | Status: DC | PRN

## 2022-10-07 MED ORDER — LACTATED RINGERS IV BOLUS
1000.0000 mL | Freq: Once | INTRAVENOUS | Status: AC
  Administered 2022-10-07: 1000 mL via INTRAVENOUS

## 2022-10-07 MED ORDER — METOCLOPRAMIDE HCL 5 MG/ML IJ SOLN
10.0000 mg | Freq: Once | INTRAMUSCULAR | Status: AC
Start: 2022-10-07 — End: 2022-10-07
  Administered 2022-10-07: 10 mg via INTRAVENOUS
  Filled 2022-10-07: qty 2

## 2022-10-07 NOTE — ED Triage Notes (Signed)
Pt presents POV from home, ambulatory, NAD, CA/Ox4  Pt reports constant nausea, lower abd pain, noted some blood in her emesis, dehydration, pt is concerned for "bleeding to the lining of her stomach" per pt she was dx with this in the past.  Pt has an appointment with Dr. Jodi Mourning 10/22/2022  Pt denies vaginal bleeding

## 2022-10-07 NOTE — Discharge Instructions (Signed)
You can take over-the-counter vitamin B6 to help with your nausea and vomiting and morning sickness.  You can also take the Reglan I prescribed although diabetes 6 is preferred.  Follow-up with your OB/GYN doctor for further evaluation

## 2022-10-07 NOTE — ED Provider Notes (Signed)
[place] EMERGENCY DEPT Provider Note   CSN: [id number] Arrival date & time: 10/07/22  0859     History  Chief Complaint  Patient presents with   Emesis    Krystal Murray is a 31 y.o. female.   Emesis    Patient is G7 P4-0-2-4 at approximately 7 weeks estimated gestational age who presents with complaints of nausea and vomiting.  Patient states for the past week or so she has had trouble with persistent nausea and vomiting.  She has not been able to eat much.  She is having trouble keeping down food and fluids.  She is feeling weak and dehydrated.  Patient states she vomited and saw some blood streaking.  She was concerned she may have some irritation to the lining of her stomach.  She has been told in the past she had this.  Patient was recently seen in the emergency room on October 9.  At that time she was having some vaginal discharge.  She was treated with Flagyl  Home Medications Prior to Admission medications   Medication Sig Start Date End Date Taking? Authorizing Provider  azithromycin (ZITHROMAX) 1 g powder Take on Wednesday, 07/29/2022. 07/22/22   Molpus, John, MD  fluconazole (DIFLUCAN) 150 MG tablet Take 1 tablet as needed for vaginal yeast infection.  May repeat in 3 days if symptoms persist. 07/22/22   Molpus, Jenny Reichmann, MD  metroNIDAZOLE (FLAGYL) 500 MG tablet Take 1 tablet (500 mg total) by mouth 2 (two) times daily. 09/28/22   Audley Hose, MD  oxyCODONE-acetaminophen (PERCOCET) 10-325 MG tablet Take 1 tablet by mouth every 6 (six) hours as needed for pain. 07/22/22   Molpus, John, MD  pyridOXINE (VITAMIN B6) 25 MG tablet Take 1 tablet (25 mg total) by mouth daily. 09/28/22   Flossie Dibble, NP      Allergies    Patient has no known allergies.    Review of Systems   Review of Systems  Gastrointestinal:  Positive for vomiting.    Physical Exam Updated Vital Signs BP 127/73 (BP Location: Right Arm)   Pulse 76   Temp 98.4 F (36.9 C) (Oral)    Resp 16   LMP 08/17/2022 (Approximate)   SpO2 100%  Physical Exam Vitals and nursing note reviewed.  Constitutional:      General: She is not in acute distress.    Appearance: She is well-developed.  HENT:     Head: Normocephalic and atraumatic.     Right Ear: External ear normal.     Left Ear: External ear normal.  Eyes:     General: No scleral icterus.       Right eye: No discharge.        Left eye: No discharge.     Conjunctiva/sclera: Conjunctivae normal.  Neck:     Trachea: No tracheal deviation.  Cardiovascular:     Rate and Rhythm: Normal rate and regular rhythm.  Pulmonary:     Effort: Pulmonary effort is normal. No respiratory distress.     Breath sounds: Normal breath sounds. No stridor. No wheezing or rales.  Abdominal:     General: Bowel sounds are normal. There is no distension.     Palpations: Abdomen is soft.     Tenderness: There is no abdominal tenderness. There is no guarding or rebound.  Musculoskeletal:        General: No tenderness or deformity.     Cervical back: Neck supple.  Skin:    General: Skin  is warm and dry.     Findings: No rash.  Neurological:     General: No focal deficit present.     Mental Status: She is alert.     Cranial Nerves: No cranial nerve deficit (no facial droop, extraocular movements intact, no slurred speech).     Sensory: No sensory deficit.     Motor: No abnormal muscle tone or seizure activity.     Coordination: Coordination normal.  Psychiatric:        Mood and Affect: Mood normal.     ED Results / Procedures / Treatments   Labs (all labs ordered are listed, but only abnormal results are displayed) Labs Reviewed - No data to display  EKG None  Radiology No results found.  Procedures Procedures    Medications Ordered in ED Medications - No data to display  ED Course/ Medical Decision Making/ A&P Clinical Course as of 10/07/22 1038  Wed Oct 07, 2022  1028 Lipase, blood(!) Lipase not elevated [JK]   1028 CBC with Diff(!) Mild anemia noted.  Increased compared to previous values [JK]  1029 Comprehensive metabolic panel(!) Significant electrolyte abnormalities noted [JK]    Clinical Course User Index [JK] Linwood Dibbles, MD                           Medical Decision Making Frontal diagnosis includes, dehydration, electrolyte abnormalities, morning sickness, GI bleeding  Problems Addressed: Nausea and vomiting during pregnancy: acute illness or injury  Amount and/or Complexity of Data Reviewed External Data Reviewed: notes.    Details: Previous ED visit evaluation reviewed Labs: ordered. Decision-making details documented in ED Course.  Risk Prescription drug management.   Patient presented to the ED for evaluation of nausea vomiting in the setting of pregnancy.  Patient's symptoms are consistent with morning sickness.  No signs of any severe dehydration or electrolyte abnormalities.  Patient was treated with IV fluids and antiemetics.  Her symptoms have improved.  Will discharge home with prescription for Reglan.  Also recommend over-the-counter B6 that she can take for her nausea and vomiting.  Patient was concerned about possible GI bleeding.  No hematemesis here in the ED.  Her hemoglobin is normal.  I doubt severe GI bleed.  Evaluation and diagnostic testing in the emergency department does not suggest an emergent condition requiring admission or immediate intervention beyond what has been performed at this time.  The patient is safe for discharge and has been instructed to return immediately for worsening symptoms, change in symptoms or any other concerns.        Final Clinical Impression(s) / ED Diagnoses Final diagnoses:  None    Rx / DC Orders ED Discharge Orders     None         Linwood Dibbles, MD 10/07/22 1040

## 2022-10-21 ENCOUNTER — Other Ambulatory Visit: Payer: Self-pay

## 2022-10-21 ENCOUNTER — Emergency Department (HOSPITAL_BASED_OUTPATIENT_CLINIC_OR_DEPARTMENT_OTHER)
Admission: EM | Admit: 2022-10-21 | Discharge: 2022-10-21 | Disposition: A | Discharge status: Home / Self Care | Payer: Medicaid Other | Attending: Emergency Medicine | Admitting: Emergency Medicine

## 2022-10-21 ENCOUNTER — Encounter (HOSPITAL_BASED_OUTPATIENT_CLINIC_OR_DEPARTMENT_OTHER): Payer: Self-pay | Admitting: *Deleted

## 2022-10-21 DIAGNOSIS — O219 Vomiting of pregnancy, unspecified: Secondary | ICD-10-CM | POA: Insufficient documentation

## 2022-10-21 DIAGNOSIS — O99511 Diseases of the respiratory system complicating pregnancy, first trimester: Secondary | ICD-10-CM | POA: Insufficient documentation

## 2022-10-21 DIAGNOSIS — O210 Mild hyperemesis gravidarum: Secondary | ICD-10-CM

## 2022-10-21 DIAGNOSIS — Z3A10 10 weeks gestation of pregnancy: Secondary | ICD-10-CM | POA: Insufficient documentation

## 2022-10-21 DIAGNOSIS — J069 Acute upper respiratory infection, unspecified: Secondary | ICD-10-CM | POA: Diagnosis not present

## 2022-10-21 DIAGNOSIS — Z3491 Encounter for supervision of normal pregnancy, unspecified, first trimester: Secondary | ICD-10-CM

## 2022-10-21 MED ORDER — ACETAMINOPHEN 500 MG PO TABS
1000.0000 mg | ORAL_TABLET | Freq: Once | ORAL | Status: AC
  Administered 2022-10-21: 1000 mg via ORAL
  Filled 2022-10-21: qty 2

## 2022-10-21 NOTE — ED Notes (Signed)
Pt given ginger ale and saltine crackers.  Bedside US by Dr. Maryan Rued

## 2022-10-21 NOTE — Discharge Instructions (Signed)
It is okay to use Tylenol and tea with honey or straight honey will help some with the cough.  Make sure you are staying hydrated and resting

## 2022-10-21 NOTE — ED Provider Notes (Signed)
[facility] DEPT Provider Note   CSN: [id number] Arrival date & time: 10/21/22  0840     History  Chief Complaint  Patient presents with   Cough   Morning Sickness    Krystal Murray is a 31 y.o. female.  Patient is a 31 year old G7, P4 who is presenting today with multiple complaints.  She reports she continues to have nausea throughout the day with recurrent vomiting and intermittent abdominal discomfort.  However the reason she came today was that she started having cough and congestion starting yesterday and her whole body hurts.  She does have a sick child at home.  She is also had sore throat but is not aware of having a fever.  She denies any shortness of breath.  She is not currently taking any medications and did not take anything prior to arrival today.  The history is provided by the patient.  Cough      Home Medications Prior to Admission medications   Medication Sig Start Date End Date Taking? Authorizing Provider  azithromycin (ZITHROMAX) 1 g powder Take on Wednesday, 07/29/2022. 07/22/22   Molpus, John, MD  fluconazole (DIFLUCAN) 150 MG tablet Take 1 tablet as needed for vaginal yeast infection.  May repeat in 3 days if symptoms persist. 07/22/22   Molpus, Jenny Reichmann, MD  metoCLOPramide (REGLAN) 10 MG tablet Take 1 tablet (10 mg total) by mouth every 8 (eight) hours as needed for nausea or vomiting. 10/07/22   Dorie Rank, MD  metroNIDAZOLE (FLAGYL) 500 MG tablet Take 1 tablet (500 mg total) by mouth 2 (two) times daily. 09/28/22   Audley Hose, MD  oxyCODONE-acetaminophen (PERCOCET) 10-325 MG tablet Take 1 tablet by mouth every 6 (six) hours as needed for pain. 07/22/22   Molpus, John, MD  pyridOXINE (VITAMIN B6) 25 MG tablet Take 1 tablet (25 mg total) by mouth daily. 09/28/22   Flossie Dibble, NP      Allergies    Patient has no known allergies.    Review of Systems   Review of Systems  Respiratory:  Positive for cough.     Physical  Exam Updated Vital Signs BP 109/70   Pulse 87   Temp 98.7 F (37.1 C)   Resp 14   LMP 08/17/2022 (Approximate)   SpO2 100%  Physical Exam Vitals and nursing note reviewed.  Constitutional:      General: She is not in acute distress.    Appearance: She is well-developed.  HENT:     Head: Normocephalic and atraumatic.     Right Ear: Tympanic membrane normal.     Left Ear: Tympanic membrane normal.     Nose: Congestion present.     Mouth/Throat:     Mouth: Mucous membranes are moist.     Pharynx: Posterior oropharyngeal erythema present. No oropharyngeal exudate.  Eyes:     Pupils: Pupils are equal, round, and reactive to light.  Cardiovascular:     Rate and Rhythm: Normal rate and regular rhythm.     Heart sounds: Normal heart sounds. No murmur heard.    No friction rub.  Pulmonary:     Effort: Pulmonary effort is normal.     Breath sounds: Normal breath sounds. No wheezing or rales.  Abdominal:     General: Bowel sounds are normal. There is no distension.     Palpations: Abdomen is soft.     Tenderness: There is no abdominal tenderness. There is no guarding or rebound.  Musculoskeletal:  General: No tenderness. Normal range of motion.     Comments: No edema  Skin:    General: Skin is warm and dry.     Findings: No rash.  Neurological:     Mental Status: She is alert and oriented to person, place, and time.     Cranial Nerves: No cranial nerve deficit.  Psychiatric:        Behavior: Behavior normal.     ED Results / Procedures / Treatments   Labs (all labs ordered are listed, but only abnormal results are displayed) Labs Reviewed - No data to display  EKG None  Radiology No results found.  Procedures Procedures   EMERGENCY DEPARTMENT [place] PREGNANCY "Study: Limited Ultrasound of the Pelvis for Pregnancy"  INDICATIONS:Pregnancy(required) Multiple views of the uterus and pelvic cavity were obtained in real-time with a multi-frequency  probe.  APPROACH:Transabdominal  PERFORMED BY: Myself IMAGES ARCHIVED?: No LIMITATIONS: none PREGNANCY FREE FLUID: None ADNEXAL FINDINGS:Left ovary not seen and Right ovary not seen GESTATIONAL AGE, ESTIMATE: 10w 1d FETAL HEART RATE: 140 INTERPRETATION: Intrauterine gestational sac noted, Yolk sac noted, and Fetal heart activity seen      Medications Ordered in ED Medications  acetaminophen (TYLENOL) tablet 1,000 mg (1,000 mg Oral Given 10/21/22 0911)    ED Course/ Medical Decision Making/ A&P                           Medical Decision Making Risk OTC drugs.   Pt with symptoms consistent with viral URI.  Well appearing here.  No signs of breathing difficulty  No signs of pharyngitis, otitis or abnormal abdominal findings.  Patient has been seen several times in the emergency room and did confirm that she was pregnant but has not had a confirmatory ultrasound.  She has had some intermittent abdominal discomfort but no abdominal pain on exam today.  Bedside ultrasound shows an IUP measuring 10 weeks with a heart rate of 140.  No evidence of ectopic pregnancy and feel that patient's symptoms are classic for first trimester pregnancy with morning sickness.  She had been given antiemetics with prior ER visits but reports they did not help so she just stopped taking them.  Discussed with her snacking frequently to help with the nausea.  Also taking Tylenol for the aches and pains with this new viral URI.  At this time she does not require any further imaging or blood work.  She does have follow-up with an OB/GYN.          Final Clinical Impression(s) / ED Diagnoses Final diagnoses:  First trimester pregnancy  Morning sickness  Viral URI with cough    Rx / DC Orders ED Discharge Orders     None         Gwyneth Sprout, MD 10/21/22 [id number]

## 2022-10-21 NOTE — ED Triage Notes (Signed)
Pt is pregnant and has been having morning sickness lasting all day and has been sick with URI

## 2022-10-26 ENCOUNTER — Ambulatory Visit (INDEPENDENT_AMBULATORY_CARE_PROVIDER_SITE_OTHER): Payer: Medicaid Other

## 2022-10-26 VITALS — BP 122/76 | HR 97 | Ht 66.0 in | Wt 190.4 lb

## 2022-10-26 DIAGNOSIS — O3680X0 Pregnancy with inconclusive fetal viability, not applicable or unspecified: Secondary | ICD-10-CM

## 2022-10-26 DIAGNOSIS — Z3480 Encounter for supervision of other normal pregnancy, unspecified trimester: Secondary | ICD-10-CM | POA: Diagnosis not present

## 2022-10-26 HISTORY — DX: Encounter for supervision of other normal pregnancy, unspecified trimester: Z34.80

## 2022-10-26 MED ORDER — BLOOD PRESSURE KIT DEVI: 1.0000 | 0 refills | Status: DC

## 2022-10-26 MED ORDER — GOJJI WEIGHT SCALE MISC: 1.0000 | 0 refills | Status: DC

## 2022-10-26 NOTE — Progress Notes (Signed)
New OB Intake  I connected with Krystal Murray on 10/26/22 at  3:10 PM EST by In person and verified that I am speaking with the correct person using two identifiers. Nurse is located at [facility] and pt is located at [place].  I discussed the limitations, risks, security and privacy concerns of performing an evaluation and management service by telephone and the availability of in person appointments. I also discussed with the patient that there may be a patient responsible charge related to this service. The patient expressed understanding and agreed to proceed.  I explained I am completing New OB Intake today. We discussed EDD of 05/24/23 that is based on LMP of 08/17/22. Pt is G7/P3023. I reviewed her allergies, medications, Medical/Surgical/OB history, and appropriate screenings. I informed her of [facility] services. [facility] information placed in AVS. Based on history, this is a low risk pregnancy.  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 Patient given information for [facility] Healthy Baby website for more information about [facility] and [place]. Patient is not interested in water birth. Offered upcoming OB visit with CNM to discuss further.  MyChart/Babyscripts MyChart access verified. I explained pt will have some visits in office and some virtually. Babyscripts instructions given and order placed. Patient verifies receipt of registration text/e-mail. Account successfully created and app downloaded.  Blood Pressure Cuff/Weight Scale Blood pressure cuff ordered for patient to pick-up from [employer]. Explained after first prenatal appt pt will check weekly  and document in 2. Patient does have weight scale.  Anatomy US Explained first scheduled [place] will be around 19 weeks. Dating and viability US performed today. Anatomy US TBD.   Labs Discussed Johnsie Cancel genetic screening with patient. Would like both Panorama and Horizon drawn at new OB visit. Routine prenatal labs needed.  COVID Vaccine Patient has not had COVID vaccine.   Is patient a CenteringPregnancy candidate?  Declined Declined due to Group setting Not a candidate due to  If accepted,   Social Determinants of Health Food Insecurity: Patient denies food insecurity. WIC Referral: Patient is interested in referral to [facility].  Transportation: Patient denies transportation needs. Childcare: Discussed no children allowed at ultrasound appointments. Offered childcare services; patient expresses need for childcare services. Childcare scheduled for appropriate appointments and information given to patient.  First visit review I reviewed new OB appt with patient. I explained they will have a provider visit that includes prenatal labs, std screening, pap smear, genetic screening, and discuss plan of care for pregnancy. Explained pt will be seen by Baltazar Najjar at first visit; encounter routed to appropriate provider. Explained that patient will be seen by pregnancy navigator following visit with provider.   Lucianne Lei, RN 10/26/2022  3:34 PM

## 2022-10-28 ENCOUNTER — Encounter: Payer: Self-pay | Admitting: Obstetrics

## 2022-10-28 ENCOUNTER — Ambulatory Visit (INDEPENDENT_AMBULATORY_CARE_PROVIDER_SITE_OTHER): Payer: Medicaid Other | Admitting: Obstetrics

## 2022-10-28 ENCOUNTER — Other Ambulatory Visit (HOSPITAL_COMMUNITY)
Admission: RE | Admit: 2022-10-28 | Discharge: 2022-10-28 | Disposition: A | Discharge status: Home / Self Care | Payer: Medicaid Other | Source: Ambulatory Visit | Attending: Obstetrics | Admitting: Obstetrics

## 2022-10-28 VITALS — BP 111/68 | HR 83 | Wt 191.5 lb

## 2022-10-28 DIAGNOSIS — Z3A10 10 weeks gestation of pregnancy: Secondary | ICD-10-CM

## 2022-10-28 DIAGNOSIS — Z3480 Encounter for supervision of other normal pregnancy, unspecified trimester: Secondary | ICD-10-CM | POA: Diagnosis present

## 2022-10-28 DIAGNOSIS — Z3481 Encounter for supervision of other normal pregnancy, first trimester: Secondary | ICD-10-CM

## 2022-10-28 DIAGNOSIS — O219 Vomiting of pregnancy, unspecified: Secondary | ICD-10-CM

## 2022-10-28 MED ORDER — PRENATAL GUMMIES 0.18-25 MG PO CHEW: 3.0000 | CHEWABLE_TABLET | Freq: Every day | ORAL | 12 refills | Status: DC

## 2022-10-28 MED ORDER — DOXYLAMINE-PYRIDOXINE 10-10 MG PO TBEC: DELAYED_RELEASE_TABLET | ORAL | 5 refills | Status: DC

## 2022-10-28 NOTE — Progress Notes (Signed)
New OB is in the office, intake and U/S completed on 10/26/22.

## 2022-10-28 NOTE — Progress Notes (Signed)
Subjective:    Krystal Murray is being seen today for her first obstetrical visit.  This is not a planned pregnancy. She is at [redacted]w[redacted]d gestation. Her obstetrical history is significant for  none . Relationship with FOB: significant other, not living together. Patient does intend to breast feed. Pregnancy history fully reviewed.  The information documented in the HPI was reviewed and verified.  Menstrual History: OB History     Gravida  7   Para  4   Term  4   Preterm  0   AB  2   Living  4      SAB  1   IAB  1   Ectopic  0   Multiple      Live Births  4            Patient's last menstrual period was 08/17/2022 (approximate).    Past Medical History:  Diagnosis Date   UTI (lower urinary tract infection)     Past Surgical History:  Procedure Laterality Date   CESAREAN SECTION N/A 02/07/2018   Procedure: CESAREAN SECTION;  Surgeon: Hermina Staggers, MD;  Location: [facility] BIRTHING SUITES;  Service: Obstetrics;  Laterality: N/A;   CESAREAN SECTION  01/28/2020    (Not in a hospital admission)  No Known Allergies  Social History   Tobacco Use   Smoking status: Never   Smokeless tobacco: Never  Substance Use Topics   Alcohol use: Not Currently    Comment: not since confirmed pregnancy    Family History  Problem Relation Age of Onset   Cancer Maternal Grandmother        breastt   Cancer Maternal Aunt        uterine   Diabetes Maternal Aunt    Hypertension Maternal Aunt    Cancer Cousin        leaukemia     Review of Systems Constitutional: negative for weight loss Gastrointestinal: negative for vomiting Genitourinary:negative for genital lesions and vaginal discharge and dysuria Musculoskeletal:negative for back pain Behavioral/Psych: negative for abusive relationship, depression, illegal drug usage and tobacco use    Objective:    BP 111/68   Pulse 83   Wt 191 lb 8 oz (86.9 kg)   LMP 08/17/2022 (Approximate)   BMI 30.91 kg/m  General  Appearance:    Alert, cooperative, no distress, appears stated age  Head:    Normocephalic, without obvious abnormality, atraumatic  Eyes:    PERRL, conjunctiva/corneas clear, EOM's intact, fundi    benign, both eyes  Ears:    Normal TM's and external ear canals, both ears  Nose:   Nares normal, septum midline, mucosa normal, no drainage    or sinus tenderness  Throat:   Lips, mucosa, and tongue normal; teeth and gums normal  Neck:   Supple, symmetrical, trachea midline, no adenopathy;    thyroid:  no enlargement/tenderness/nodules; no carotid   bruit or JVD  Back:     Symmetric, no curvature, ROM normal, no CVA tenderness  Lungs:     Clear to auscultation bilaterally, respirations unlabored  Chest Wall:    No tenderness or deformity   Heart:    Regular rate and rhythm, S1 and S2 normal, no murmur, rub   or gallop  Breast Exam:    No tenderness, masses, or nipple abnormality  Abdomen:     Soft, non-tender, bowel sounds active all four quadrants,    no masses, no organomegaly  Genitalia:    Normal female without  lesion, discharge or tenderness  Extremities:   Extremities normal, atraumatic, no cyanosis or edema  Pulses:   2+ and symmetric all extremities  Skin:   Skin color, texture, turgor normal, no rashes or lesions  Lymph nodes:   Cervical, supraclavicular, and axillary nodes normal  Neurologic:   CNII-XII intact, normal strength, sensation and reflexes    throughout      Lab Review Urine pregnancy test Labs reviewed yes Radiologic studies reviewed yes  Assessment:    Pregnancy at [redacted]w[redacted]d weeks    Plan:    1. Supervision of other normal pregnancy, antepartum Rx: - Culture, OB Urine - Cytology - PAP( [place]) - Cervicovaginal ancillary only( [place]) - CBC/D/Plt+RPR+Rh+ABO+RubIgG... - Panorama Prenatal Test Full Panel - HORIZON Custom - Prenatal MV & Min w/FA-DHA (PRENATAL GUMMIES) 0.18-25 MG CHEW; Chew 3 tablets by mouth daily.  Dispense: 90 tablet; Refill:  12  2. Nausea and vomiting in pregnancy Rx: - Doxylamine-Pyridoxine (DICLEGIS) 10-10 MG TBEC; 1 tab in AM, 1 tab mid afternoon 2 tabs at bedtime. Max dose 4 tabs daily.  Dispense: 100 tablet; Refill: 5  - discussed dietary changes to counter-act nausea   Prenatal vitamins.  Counseling provided regarding continued use of seat belts, cessation of alcohol consumption, smoking or use of illicit drugs; infection precautions i.e., influenza/TDAP immunizations, toxoplasmosis,CMV, parvovirus, listeria and varicella; workplace safety, exercise during pregnancy; routine dental care, safe medications, sexual activity, hot tubs, saunas, pools, travel, caffeine use, fish and methlymercury, potential toxins, hair treatments, varicose veins Weight gain recommendations per IOM guidelines reviewed: underweight/BMI< 18.5--> gain 28 - 40 lbs; normal weight/BMI 18.5 - 24.9--> gain 25 - 35 lbs; overweight/BMI 25 - 29.9--> gain 15 - 25 lbs; obese/BMI >30->gain  11 - 20 lbs Problem list reviewed and updated. FIRST/CF mutation testing/NIPT/QUAD SCREEN/fragile X/Ashkenazi Jewish population testing/Spinal muscular atrophy discussed: requested. Role of ultrasound in pregnancy discussed; fetal survey: requested. Amniocentesis discussed: not indicated.  Meds ordered this encounter  Medications   Prenatal MV & Min w/FA-DHA (PRENATAL GUMMIES) 0.18-25 MG CHEW    Sig: Chew 3 tablets by mouth daily.    Dispense:  90 tablet    Refill:  12   Doxylamine-Pyridoxine (DICLEGIS) 10-10 MG TBEC    Sig: 1 tab in AM, 1 tab mid afternoon 2 tabs at bedtime. Max dose 4 tabs daily.    Dispense:  100 tablet    Refill:  5   Orders Placed This Encounter  Procedures   Culture, OB Urine   CBC/D/Plt+RPR+Rh+ABO+RubIgG...   Panorama Prenatal Test Full Panel    ==========Department Information========== ID: [id number] Department:[facility] [address] Stalls  [address] [place] [address] Dept: [phone] Dept Fax: [phone]     Order Specific Question:   Expected due date (MM/DD/YYYY):    Answer:   05/24/2023    Order Specific Question:   Is this a twin pregnancy? (viable, no vanished twin)    Answer:   No    Order Specific Question:   Is this a surrogate or egg donor pregnancy?    Answer:   No    Order Specific Question:   I want fetal sex included in the report:    Answer:   Yes    Order Specific Question:   Maternal Weight (lbs):    Answer:   54    Order Specific Question:   Which Microdeletion  Panel should be ordered?    Answer:   22q11.2 Deletion    Order Specific Question:   What type of billing?    Answer:   Furniture conservator/restorer Question:   By placing this electronic order I confirm the testing ordered herein is medically necessary and this patient has been informed of the details of the genetic test(s) ordered, including the risks, benefits, and alternatives, and has consented to testing.    Answer:   Yes    Order Specific Question:   Select an order diagnosis: For additional options refer to [email or web address]    Answer:   Encounter for supervision of other normal pregnancy in first trimester [[id number]]   HORIZON Custom    ==========Department Information========== ID: [id number] Department:[facility] AT [facility] [address] [place] [address] Dept: [phone] Dept Fax: [phone]     Order Specific Question:   Specify the name or ID of a valid Horizon Custom Panel:    Answer:   HBasic    Order Specific Question:   Is patient pregnant?    Answer:   Yes    Order Specific Question:   Ethnicity of patient:    Answer:   African American    Order Specific Question:   Practice ensures that HIPAA consent is obtained and will make available to [facility] upon request?    Answer:   Yes    Order Specific Question:    By placing this electronic order I confirm the testing ordered herein is medically necessary and this patient has been informed of the details of the genetic test(s) ordered, including the risks, benefits, and alternatives, and has consented to testing.    Answer:   Yes    Order Specific Question:   What type of billing?    Answer:   [employer] Specific Question:   Select an order diagnosis: For additional options refer to [email or web address]    Answer:   Encounter for supervision of other normal pregnancy, first trimester [[id number]]    Order Specific Question:   Tay-Sachs add-on test?    Answer:   No    Follow up in 4 weeks   Brock Bad, MD 10/28/2022 11:24 AM

## 2022-10-29 LAB — CBC/D/PLT+RPR+RH+ABO+RUBIGG...
Antibody Screen: NEGATIVE
Basophils Absolute: 0 10*3/uL (ref 0.0–0.2)
Basos: 0 %
EOS (ABSOLUTE): 0 10*3/uL (ref 0.0–0.4)
Eos: 0 %
HCV Ab: NONREACTIVE
HIV Screen 4th Generation wRfx: NONREACTIVE
Hematocrit: 34.5 % (ref 34.0–46.6)
Hemoglobin: 11 g/dL — ABNORMAL LOW (ref 11.1–15.9)
Hepatitis B Surface Ag: NEGATIVE
Immature Grans (Abs): 0 10*3/uL (ref 0.0–0.1)
Immature Granulocytes: 0 %
Lymphocytes Absolute: 2.2 10*3/uL (ref 0.7–3.1)
Lymphs: 33 %
MCH: 25.9 pg — ABNORMAL LOW (ref 26.6–33.0)
MCHC: 31.9 g/dL (ref 31.5–35.7)
MCV: 81 fL (ref 79–97)
Monocytes Absolute: 0.6 10*3/uL (ref 0.1–0.9)
Monocytes: 9 %
Neutrophils Absolute: 3.9 10*3/uL (ref 1.4–7.0)
Neutrophils: 58 %
Platelets: 214 10*3/uL (ref 150–450)
RBC: 4.24 x10E6/uL (ref 3.77–5.28)
RDW: 15.9 % — ABNORMAL HIGH (ref 11.7–15.4)
RPR Ser Ql: NONREACTIVE
Rh Factor: NEGATIVE
Rubella Antibodies, IGG: 10.8 index (ref >0.99)
WBC: 6.8 10*3/uL (ref 3.4–10.8)

## 2022-10-29 LAB — CERVICOVAGINAL ANCILLARY ONLY
Bacterial Vaginitis (gardnerella): NEGATIVE
Candida Glabrata: NEGATIVE
Candida Vaginitis: NEGATIVE
Chlamydia: NEGATIVE
Comment: NEGATIVE
Comment: NEGATIVE
Comment: NEGATIVE
Comment: NEGATIVE
Comment: NEGATIVE
Comment: NORMAL
Neisseria Gonorrhea: NEGATIVE
Trichomonas: NEGATIVE

## 2022-10-29 LAB — HCV INTERPRETATION

## 2022-10-30 LAB — CULTURE, OB URINE

## 2022-10-30 LAB — URINE CULTURE, OB REFLEX

## 2022-10-30 LAB — CYTOLOGY - PAP: Diagnosis: NEGATIVE

## 2022-11-02 LAB — PANORAMA PRENATAL TEST FULL PANEL:PANORAMA TEST PLUS 5 ADDITIONAL MICRODELETIONS: FETAL FRACTION: 4.8

## 2022-11-07 LAB — HORIZON CUSTOM: REPORT SUMMARY: POSITIVE — AB

## 2022-11-11 ENCOUNTER — Other Ambulatory Visit: Payer: Self-pay | Admitting: *Deleted

## 2022-11-11 DIAGNOSIS — O285 Abnormal chromosomal and genetic finding on antenatal screening of mother: Secondary | ICD-10-CM

## 2022-11-11 DIAGNOSIS — Z3480 Encounter for supervision of other normal pregnancy, unspecified trimester: Secondary | ICD-10-CM

## 2022-11-11 NOTE — Progress Notes (Signed)
Anatomy scan order changed to OB Detail. Pt is silent carrier alpha thal.

## 2022-11-11 NOTE — Progress Notes (Signed)
MyChart message sent. Pt is active in MyChart. MFM genetic referral placed.

## 2022-11-11 NOTE — Progress Notes (Signed)
MFM genetic referral placed for abnormal maternal screen, silent carrier alpha thal.

## 2022-11-24 ENCOUNTER — Encounter: Payer: Self-pay | Admitting: Obstetrics

## 2022-11-24 ENCOUNTER — Ambulatory Visit (INDEPENDENT_AMBULATORY_CARE_PROVIDER_SITE_OTHER): Payer: Medicaid Other | Admitting: Obstetrics

## 2022-11-24 VITALS — BP 122/66 | HR 87 | Wt 199.0 lb

## 2022-11-24 DIAGNOSIS — Z3482 Encounter for supervision of other normal pregnancy, second trimester: Secondary | ICD-10-CM

## 2022-11-24 DIAGNOSIS — Z3480 Encounter for supervision of other normal pregnancy, unspecified trimester: Secondary | ICD-10-CM

## 2022-11-24 DIAGNOSIS — D563 Thalassemia minor: Secondary | ICD-10-CM

## 2022-11-24 DIAGNOSIS — Z3A14 14 weeks gestation of pregnancy: Secondary | ICD-10-CM

## 2022-11-24 DIAGNOSIS — R350 Frequency of micturition: Secondary | ICD-10-CM

## 2022-11-24 NOTE — Progress Notes (Signed)
Subjective:  Krystal Murray is a 31 y.o. K2H0623 at [redacted]w[redacted]d being seen today for ongoing prenatal care.  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  Patient reports  urinary frequency .  Contractions: Not present. Vag. Bleeding: None.   . Denies leaking of fluid.   The following portions of the patient's history were reviewed and updated as appropriate: allergies, current medications, past family history, past medical history, past social history, past surgical history and problem list. Problem list updated.  Objective:   Vitals:   11/24/22 0951  BP: 122/66  Pulse: 87  Weight: 199 lb (90.3 kg)    Fetal Status: Fetal Heart Rate (bpm): 149         General:  Alert, oriented and cooperative. Patient is in no acute distress.  Skin: Skin is warm and dry. No rash noted.   Cardiovascular: Normal heart rate noted  Respiratory: Normal respiratory effort, no problems with respiration noted  Abdomen: Soft, gravid, appropriate for gestational age. Pain/Pressure: Present     Pelvic:  Cervical exam deferred        Extremities: Normal range of motion.  Edema: None  Mental Status: Normal mood and affect. Normal behavior. Normal judgment and thought content.   Urinalysis:      Assessment and Plan:  Pregnancy: [id number] at [redacted]w[redacted]d  1. Supervision of other normal pregnancy, antepartum  2. Urinary frequency Rx: - Urine Culture  3. Alpha thalassemia silent carrier - partner to be tested    Preterm labor symptoms and general obstetric precautions including but not limited to vaginal bleeding, contractions, leaking of fluid and fetal movement were reviewed in detail with the  patient. Please refer to After Visit Summary for other counseling recommendations.   Return in about 4 weeks (around 12/22/2022) for ROB.   Brock Bad, MD 11/24/22

## 2022-12-22 ENCOUNTER — Encounter: Payer: Medicaid Other | Admitting: Medical

## 2022-12-29 ENCOUNTER — Ambulatory Visit: Payer: Medicaid Other | Attending: Maternal & Fetal Medicine

## 2022-12-29 ENCOUNTER — Encounter: Payer: Medicaid Other | Admitting: Obstetrics and Gynecology

## 2022-12-29 ENCOUNTER — Ambulatory Visit: Payer: Medicaid Other | Admitting: *Deleted

## 2022-12-29 ENCOUNTER — Ambulatory Visit (HOSPITAL_BASED_OUTPATIENT_CLINIC_OR_DEPARTMENT_OTHER): Payer: Medicaid Other

## 2022-12-29 ENCOUNTER — Other Ambulatory Visit: Payer: Self-pay | Admitting: *Deleted

## 2022-12-29 VITALS — BP 109/83 | HR 91

## 2022-12-29 DIAGNOSIS — D563 Thalassemia minor: Secondary | ICD-10-CM

## 2022-12-29 DIAGNOSIS — O34219 Maternal care for unspecified type scar from previous cesarean delivery: Secondary | ICD-10-CM

## 2022-12-29 DIAGNOSIS — Z363 Encounter for antenatal screening for malformations: Secondary | ICD-10-CM | POA: Insufficient documentation

## 2022-12-29 DIAGNOSIS — Z148 Genetic carrier of other disease: Secondary | ICD-10-CM | POA: Insufficient documentation

## 2022-12-29 DIAGNOSIS — Z3A19 19 weeks gestation of pregnancy: Secondary | ICD-10-CM | POA: Diagnosis not present

## 2022-12-29 DIAGNOSIS — E669 Obesity, unspecified: Secondary | ICD-10-CM

## 2022-12-29 DIAGNOSIS — O99212 Obesity complicating pregnancy, second trimester: Secondary | ICD-10-CM | POA: Diagnosis not present

## 2022-12-29 DIAGNOSIS — Z3480 Encounter for supervision of other normal pregnancy, unspecified trimester: Secondary | ICD-10-CM

## 2022-12-29 DIAGNOSIS — O99210 Obesity complicating pregnancy, unspecified trimester: Secondary | ICD-10-CM

## 2022-12-29 DIAGNOSIS — O285 Abnormal chromosomal and genetic finding on antenatal screening of mother: Secondary | ICD-10-CM | POA: Diagnosis not present

## 2022-12-29 NOTE — Progress Notes (Signed)
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   EDD: 05/19/2023 LMP: 08/17/2022 Referring Provider:  Brock Bad, MD  EGA: [redacted]w[redacted]d Genetic Counselor: Krystal Dunk, MS, CGC  OB Hx: [id number] Date of Appointment: 12/29/2022  Accompanied by: Krystal Murray Face to Face Time: 30 Minutes   Previous Testing Completed: Krystal Murray previously completed cell-free DNA screening (cfDNA) in this pregnancy. The result is low risk. This screening significantly reduces the risk that the current pregnancy has Down syndrome, Trisomy 50, Trisomy 41, and common sex chromosome conditions, however, the risk is not zero given the limitations of cfDNA. Additionally, there are many genetic conditions that cannot be detected by cfDNA.  [name] previously completed carrier screening. She screened to be a silent carrier for alpha thalassemia. She screened to not be a carrier for Cystic Fibrosis (CF), Spinal Muscular Atrophy (SMA), and Beta Hemoglobinopathies. A negative result on carrier screening reduces the likelihood of being a carrier, however, does not entirely rule out the possibility.   Medical History:  This is Krystal Murray's 7th pregnancy. She has 4 living children. She has had 2 early losses, one of which was elective. Reports she takes prenatal vitamins. Denies personal history of diabetes, high blood pressure, thyroid conditions, and seizures. Denies bleeding, infections, and fevers in this pregnancy. Denies using tobacco, alcohol, or street drugs in this pregnancy.   Family History: A pedigree was created and scanned into Epic under the Media tab. 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 Denies Ashkenazi Jewish ancestry. Family history not  remarkable for consanguinity, individuals with birth defects, intellectual disability, autism spectrum disorder, multiple spontaneous abortions, still births, or unexplained neonatal death.     Genetic Counseling:   Maternal Silent Carrier for Alpha Thalassemia. Alpha Thalassemia refers to a group of autosomal recessive blood disorders that reduce the amount of hemoglobin, the protein in red blood cells that carries oxygen to tissues throughout the body. Hemoglobin is made up of both alpha globin and beta globin proteins. Alpha Thalassemia is different in its inheritance compared to other hemoglobinopathies as there are two alpha-globin genes on each chromosome 16 (??/??). Alpha Thalassemia occurs when three or more of the four alpha-globin genes are deleted. Krystal Murray is a silent carrier for alpha thalassemia (??/-?) caused by the pathogenic alpha 3.7 deletion of the HBA2 gene. With this result, we know that Krystal Murray has three functional copies of the alpha-globin genes while her 4th alpha-globin gene is deleted. Each of Krystal Murray's children will either inherit two functional genes (??) or one functional gene and one deletion (-?) from her.  Krystal Murray is not at increased risk to have a baby with fetal hydrops due to Hemoglobin Barts disease (four deleted alpha-globin genes: --/--) regardless of her reproductive partner's carrier status. Having four deleted alpha-globin genes results in severe anemia. Affected babies develop symptoms before birth and without treatment typically do not survive the newborn period.  If Krystal Murray's reproductive partner is found to be an Alpha Thalassemia carrier in the cis configuration (two deleted alpha-globin genes on the same chromosome: ??/--) there would be a 25% risk for the current pregnancy to be affected with Hemoglobin H Disease (three deleted alpha-globin genes: --/-?). Clinical features of this condition are highly variable and generally develop in the first years of  life.  People with severe symptoms may have chronic anemia, liver disease, and bone changes. Some affected individuals do not require blood transfusions while others may require occasional blood transfusions throughout their lifetime.  Given Krystal Murray's carrier screening result, carrier screening for her reproductive partner was offered to determine risk for the current pregnancy. If her reproductive partner is found to be a carrier for Alpha Thalassemia, given his Black/African American ancestry, it is more likely for him to be a silent carrier (??/-?) or a carrier in the trans configuration (two deleted or changed alpha-globin genes on opposite chromosomes: -?/-?), as the cis configuration (??/--) has been reported very rarely in individuals with Black/African American ancestry. If both members of a couple are known to be carriers, diagnostic testing is available to determine if the pregnancy is affected. This can be performed via amniocentesis after [redacted] weeks gestation. This procedure involves the removal of a small amount of amniotic fluid from the sac surrounding the pregnancy with the use of a thin needle inserted through the maternal abdomen and uterus. Ultrasound guidance is used throughout the procedure. Possible procedural difficulties and complications that can arise include maternal infection, cramping, bleeding, fluid leakage, and/or pregnancy loss. The risk for pregnancy loss with an amniocentesis is 1/500. If diagnostic testing via amniocentesis is not desired, Alpha Thalassemia testing can be completed after birth.     Patient Plan:  Proceed with: Routine prenatal care Informed consent was obtained. All questions were answered.  Declined: Amniocentesis, Carrier Screening for the father of the current pregnancy for alpha thalassemia   Thank you for sharing in the care of Krystal Murray with [place].  Please do not hesitate to contact us if you have any questions.  Staci Righter, MS, [facility]

## 2023-01-12 ENCOUNTER — Ambulatory Visit: Payer: Self-pay | Admitting: Genetics

## 2023-01-17 ENCOUNTER — Emergency Department (HOSPITAL_BASED_OUTPATIENT_CLINIC_OR_DEPARTMENT_OTHER)
Admission: EM | Admit: 2023-01-17 | Discharge: 2023-01-17 | Disposition: A | Discharge status: Home / Self Care | Payer: Medicaid Other | Attending: Emergency Medicine | Admitting: Emergency Medicine

## 2023-01-17 ENCOUNTER — Encounter (HOSPITAL_BASED_OUTPATIENT_CLINIC_OR_DEPARTMENT_OTHER): Payer: Self-pay

## 2023-01-17 ENCOUNTER — Other Ambulatory Visit: Payer: Self-pay

## 2023-01-17 DIAGNOSIS — K0889 Other specified disorders of teeth and supporting structures: Secondary | ICD-10-CM

## 2023-01-17 MED ORDER — AMOXICILLIN 500 MG PO CAPS: 1000.0000 mg | ORAL_CAPSULE | Freq: Two times a day (BID) | ORAL | 0 refills | Status: DC

## 2023-01-17 MED ORDER — KETOROLAC TROMETHAMINE 15 MG/ML IJ SOLN
15.0000 mg | Freq: Once | INTRAMUSCULAR | Status: AC
  Administered 2023-01-17: 15 mg via INTRAMUSCULAR
  Filled 2023-01-17: qty 1

## 2023-01-17 MED ORDER — ACETAMINOPHEN 500 MG PO TABS
1000.0000 mg | ORAL_TABLET | Freq: Once | ORAL | Status: AC
Start: 2023-01-17 — End: 2023-01-17
  Administered 2023-01-17: 1000 mg via ORAL
  Filled 2023-01-17: qty 2

## 2023-01-17 MED ORDER — OXYCODONE HCL 5 MG PO TABS
5.0000 mg | ORAL_TABLET | Freq: Once | ORAL | Status: AC
  Administered 2023-01-17: 5 mg via ORAL
  Filled 2023-01-17: qty 1

## 2023-01-17 MED ORDER — AMOXICILLIN 500 MG PO CAPS
1000.0000 mg | ORAL_CAPSULE | Freq: Once | ORAL | Status: AC
  Administered 2023-01-17: 1000 mg via ORAL
  Filled 2023-01-17: qty 2

## 2023-01-17 NOTE — ED Triage Notes (Signed)
Complains with toothache since this past Thursday.  Treated last for dental and needed a root canal, but patient never had the procedure.

## 2023-01-17 NOTE — Discharge Instructions (Addendum)
Follow-up with your dentist in the office.  Take the antibiotics as prescribed.  Return for fever or difficulty swallowing.  Take 4 over the counter ibuprofen tablets 3 times a day or 2 over-the-counter naproxen tablets twice a day for pain. Also take tylenol 1000mg (2 extra strength) four times a day.

## 2023-01-17 NOTE — ED Provider Notes (Signed)
[facility] Provider Note   CSN: [id number] Arrival date & time: 01/17/23  0630     History  Chief Complaint  Patient presents with   Dental Pain    Krystal Murray is a 32 y.o. female.  33 yo F with a chief complaints of dental pain.  This is to bilateral lower third molars.  She was told that she needs a root canal but has not yet had it done.  She had worsening of her symptoms over the past couple days to the point where she had trouble eating or sleeping and so came here for evaluation.  No fevers no difficulty swallowing.  No trauma.   Dental Pain      Home Medications Prior to Admission medications   Medication Sig Start Date End Date Taking? Authorizing Provider  amoxicillin (AMOXIL) 500 MG capsule Take 2 capsules (1,000 mg total) by mouth 2 (two) times daily. 01/17/23  Yes Deno Etienne, DO  azithromycin [facility]) 1 g powder Take on Wednesday, 07/29/2022. Patient not taking: Reported on 10/28/2022 07/22/22   Molpus, John, MD  Blood Pressure Monitoring (BLOOD PRESSURE KIT) DEVI 1 kit by Does not apply route once a week. Patient not taking: Reported on 10/28/2022 10/26/22   Constant, Peggy, MD  Doxylamine-Pyridoxine (DICLEGIS) 10-10 MG TBEC 1 tab in AM, 1 tab mid afternoon 2 tabs at bedtime. Max dose 4 tabs daily. Patient not taking: Reported on 12/29/2022 10/28/22   Shelly Bombard, MD  Misc. Devices (GOJJI WEIGHT SCALE) MISC 1 Device by Does not apply route every 30 (thirty) days. Patient not taking: Reported on 10/28/2022 10/26/22   Constant, Peggy, MD  Prenatal MV & Min w/FA-DHA (PRENATAL GUMMIES) 0.18-25 MG CHEW Chew 3 tablets by mouth daily. 10/28/22   Shelly Bombard, MD      Allergies    Patient has no known allergies.    Review of Systems   Review of Systems  Physical Exam Updated Vital Signs BP 114/77 (BP Location: Right Arm)   Pulse 77   Temp 98.7 F (37.1 C)   Resp 16   Ht 5\' 6"  (1.676 m)   Wt 86.2 kg   LMP  08/17/2022 (Approximate)   SpO2 100%   BMI 30.67 kg/m  Physical Exam Vitals and nursing note reviewed.  Constitutional:      General: She is not in acute distress.    Appearance: She is well-developed. She is not diaphoretic.  HENT:     Head: Normocephalic and atraumatic.     Mouth/Throat:     Comments: Poor dentition diffusely.  No obvious pain with percussion of the molar.  No surrounding edema no sublingual swelling tolerating secretions without difficulty. Eyes:     Pupils: Pupils are equal, round, and reactive to light.  Cardiovascular:     Rate and Rhythm: Normal rate and regular rhythm.     Heart sounds: No murmur heard.    No friction rub. No gallop.  Pulmonary:     Effort: Pulmonary effort is normal.     Breath sounds: No wheezing or rales.  Abdominal:     General: There is no distension.     Palpations: Abdomen is soft.     Tenderness: There is no abdominal tenderness.  Musculoskeletal:        General: No tenderness.     Cervical back: Normal range of motion and neck supple.  Skin:    General: Skin is warm and dry.  Neurological:  Mental Status: She is alert and oriented to person, place, and time.  Psychiatric:        Behavior: Behavior normal.     ED Results / Procedures / Treatments   Labs (all labs ordered are listed, but only abnormal results are displayed) Labs Reviewed - No data to display  EKG None  Radiology No results found.  Procedures Procedures    Medications Ordered in ED Medications  acetaminophen (TYLENOL) tablet 1,000 mg (has no administration in time range)  ketorolac (TORADOL) 15 MG/ML injection 15 mg (has no administration in time range)  oxyCODONE (Oxy IR/ROXICODONE) immediate release tablet 5 mg (has no administration in time range)  amoxicillin (AMOXIL) capsule 1,000 mg (has no administration in time range)    ED Course/ Medical Decision Making/ A&P                             Medical Decision Making Risk OTC  drugs. Prescription drug management.   32 yo F with a chief complaints of bilateral lower dental pain.  This has been going on for some time she is already seen a dentist and was told that she needed a root canal.  Had worsening over the past 24 to 48 hours.  No obvious clinical infection.  Will treat with a course of antibiotics.  Encouraged her to follow-up with her dentist which she has an appointment early this week. 10:29 AM:  I have discussed the diagnosis/risks/treatment options with the patient.  Evaluation and diagnostic testing in the emergency department does not suggest an emergent condition requiring admission or immediate intervention beyond what has been performed at this time.  They will follow up with Dentist. We also discussed returning to the ED immediately if new or worsening sx occur. We discussed the sx which are most concerning (e.g., sudden worsening pain, fever, inability to tolerate by mouth) that necessitate immediate return. Medications administered to the patient during their visit and any new prescriptions provided to the patient are listed below.  Medications given during this visit Medications  acetaminophen (TYLENOL) tablet 1,000 mg (has no administration in time range)  ketorolac (TORADOL) 15 MG/ML injection 15 mg (has no administration in time range)  oxyCODONE (Oxy IR/ROXICODONE) immediate release tablet 5 mg (has no administration in time range)  amoxicillin (AMOXIL) capsule 1,000 mg (has no administration in time range)     The patient appears reasonably screen and/or stabilized for discharge and I doubt any other medical condition or other [facility] requiring further screening, evaluation, or treatment in the ED at this time prior to discharge.          Final Clinical Impression(s) / ED Diagnoses Final diagnoses:  Pain, dental    Rx / DC Orders ED Discharge Orders          Ordered    amoxicillin (AMOXIL) 500 MG capsule  2 times daily        01/17/23  [place], Sarrah Fiorenza, DO 01/17/23 1029

## 2023-01-21 ENCOUNTER — Ambulatory Visit (INDEPENDENT_AMBULATORY_CARE_PROVIDER_SITE_OTHER): Payer: Medicaid Other | Admitting: Obstetrics

## 2023-01-21 ENCOUNTER — Encounter: Payer: Self-pay | Admitting: Obstetrics

## 2023-01-21 VITALS — BP 110/65 | HR 84 | Wt 213.0 lb

## 2023-01-21 DIAGNOSIS — Z3A22 22 weeks gestation of pregnancy: Secondary | ICD-10-CM

## 2023-01-21 DIAGNOSIS — Z3480 Encounter for supervision of other normal pregnancy, unspecified trimester: Secondary | ICD-10-CM

## 2023-01-21 DIAGNOSIS — Z3482 Encounter for supervision of other normal pregnancy, second trimester: Secondary | ICD-10-CM

## 2023-01-21 DIAGNOSIS — N898 Other specified noninflammatory disorders of vagina: Secondary | ICD-10-CM

## 2023-01-21 MED ORDER — TERCONAZOLE 0.4 % VA CREA: 1.0000 | TOPICAL_CREAM | Freq: Every day | VAGINAL | 0 refills | Status: DC

## 2023-01-21 NOTE — Progress Notes (Signed)
Subjective:  Krystal Murray is a 32 y.o. [phone] at [redacted]w[redacted]d being seen today for ongoing prenatal care.  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  Patient reports vaginal irritation.  Contractions: Not present. Vag. Bleeding: None.  Movement: Present. Denies leaking of fluid.   The following portions of the patient's history were reviewed and updated as appropriate: allergies, current medications, past family history, past medical history, past social history, past surgical history and problem list. Problem list updated.  Objective:   Vitals:   01/21/23 1114  BP: 110/65  Pulse: 84  Weight: 213 lb (96.6 kg)    Fetal Status: Fetal Heart Rate (bpm): 150   Movement: Present     General:  Alert, oriented and cooperative. Patient is in no acute distress.  Skin: Skin is warm and dry. No rash noted.   Cardiovascular: Normal heart rate noted  Respiratory: Normal respiratory effort, no problems with respiration noted  Abdomen: Soft, gravid, appropriate for gestational age. Pain/Pressure: Absent     Pelvic:  Cervical exam deferred        Extremities: Normal range of motion.  Edema: Trace  Mental Status: Normal mood and affect. Normal behavior. Normal judgment and thought content.   Urinalysis:      Assessment and Plan:  Pregnancy: [id number] at [redacted]w[redacted]d  1. Supervision of other normal pregnancy, antepartum  2. Vaginal irritation Rx: - terconazole (TERAZOL 7) 0.4 % vaginal cream; Place 1 applicator vaginally at bedtime.  Dispense: 45 g; Refill: 0    Preterm labor symptoms and general obstetric precautions including but not limited to  vaginal bleeding, contractions, leaking of fluid and fetal movement were reviewed in detail with the patient. Please refer to After Visit Summary for other counseling recommendations.   Return in about 4 weeks (around 02/18/2023) for ROB.   Shelly Bombard, MD 2/1/20124

## 2023-01-21 NOTE — Progress Notes (Signed)
Pt presents for ROB visit. Pt requesting csection scheduling due to childcare needs. Pt requesting Rx for dental pain. She see the dentist today.

## 2023-02-02 ENCOUNTER — Ambulatory Visit: Payer: Medicaid Other

## 2023-02-02 ENCOUNTER — Ambulatory Visit: Payer: Medicaid Other | Attending: Maternal & Fetal Medicine

## 2023-02-18 ENCOUNTER — Encounter: Payer: Medicaid Other | Admitting: Obstetrics

## 2023-02-18 ENCOUNTER — Telehealth: Payer: Self-pay

## 2023-02-18 NOTE — Telephone Encounter (Signed)
reschedule

## 2023-02-25 ENCOUNTER — Encounter: Payer: Self-pay | Admitting: Obstetrics

## 2023-02-26 ENCOUNTER — Encounter (HOSPITAL_BASED_OUTPATIENT_CLINIC_OR_DEPARTMENT_OTHER): Payer: Self-pay

## 2023-02-26 ENCOUNTER — Other Ambulatory Visit: Payer: Self-pay

## 2023-02-26 ENCOUNTER — Encounter: Payer: Medicaid Other | Admitting: Obstetrics & Gynecology

## 2023-02-26 ENCOUNTER — Emergency Department (HOSPITAL_BASED_OUTPATIENT_CLINIC_OR_DEPARTMENT_OTHER)
Admission: EM | Admit: 2023-02-26 | Discharge: 2023-02-26 | Disposition: A | Discharge status: Home / Self Care | Payer: Medicaid Other | Attending: Emergency Medicine | Admitting: Emergency Medicine

## 2023-02-26 DIAGNOSIS — O99619 Diseases of the digestive system complicating pregnancy, unspecified trimester: Secondary | ICD-10-CM | POA: Diagnosis present

## 2023-02-26 DIAGNOSIS — K0889 Other specified disorders of teeth and supporting structures: Secondary | ICD-10-CM | POA: Insufficient documentation

## 2023-02-26 MED ORDER — OXYCODONE HCL 5 MG PO CAPS: 5.0000 mg | ORAL_CAPSULE | Freq: Four times a day (QID) | ORAL | 0 refills | Status: DC | PRN

## 2023-02-26 NOTE — Discharge Instructions (Signed)
You may take pain medication as needed for your dental pain however please be aware these medications can cause drowsiness or can be harmful for your pregnancy. If pain persist please follow up with your dentist for further care.  Do not exceed taking more than 3,'000mg'$  of tylenol in a day as it can be harmful for your liver.

## 2023-02-26 NOTE — ED Provider Notes (Signed)
[facility] Provider Note   CSN: [id number] Arrival date & time: 02/26/23  1727     History  Chief Complaint  Patient presents with   Dental Pain    Krystal Murray is a 32 y.o. female.  The history is provided by the patient and medical records. No language interpreter was used.  Dental Pain    32 year old female presenting with complaints of dental pain.  Patient report yesterday she had her bilateral lower wisdom teeth removed she reported pain to the affected area started shortly after the anesthesia was subside and have been persistent since.  Patient was prescribed Tylenol 3, a total of 12 pills, and patient states she has gone through her pain medications but it provided minimal relief.  She is also currently pregnant.  She endorsed persistent throbbing achy pain at the surgical site.  No fever.  States that her procedure went smoothly and she did not have any discomfort during the procedure.  Home Medications Prior to Admission medications   Medication Sig Start Date End Date Taking? Authorizing Provider  amoxicillin (AMOXIL) 500 MG capsule Take 2 capsules (1,000 mg total) by mouth 2 (two) times daily. 01/17/23   Deno Etienne, DO  azithromycin (ZITHROMAX) 1 g powder Take on Wednesday, 07/29/2022. Patient not taking: Reported on 10/28/2022 07/22/22   Molpus, John, MD  Blood Pressure Monitoring (BLOOD PRESSURE KIT) DEVI 1 kit by Does not apply route once a week. Patient not taking: Reported on 10/28/2022 10/26/22   Constant, Peggy, MD  Doxylamine-Pyridoxine (DICLEGIS) 10-10 MG TBEC 1 tab in AM, 1 tab mid afternoon 2 tabs at bedtime. Max dose 4 tabs daily. Patient not taking: Reported on 12/29/2022 10/28/22   Shelly Bombard, MD  Misc. Devices (GOJJI WEIGHT SCALE) MISC 1 Device by Does not apply route every 30 (thirty) days. Patient not taking: Reported on 10/28/2022 10/26/22   Constant, Peggy, MD  Prenatal MV & Min w/FA-DHA (PRENATAL GUMMIES)  0.18-25 MG CHEW Chew 3 tablets by mouth daily. 10/28/22   Shelly Bombard, MD  terconazole (TERAZOL 7) 0.4 % vaginal cream Place 1 applicator vaginally at bedtime. 01/21/23   Shelly Bombard, MD      Allergies    Patient has no known allergies.    Review of Systems   Review of Systems  All other systems reviewed and are negative.   Physical Exam Updated Vital Signs BP (!) 134/100 (BP Location: Right Arm)   Pulse 84   Temp 98 F (36.7 C) (Oral)   Resp 18   Ht '5\' 7"'$  (1.702 m)   Wt 90.7 kg   LMP 08/17/2022 (Approximate)   SpO2 100%   BMI 31.32 kg/m  Physical Exam Vitals and nursing note reviewed.  Constitutional:      General: She is not in acute distress.    Appearance: She is well-developed.  HENT:     Head: Atraumatic.     Mouth/Throat:     Comments: Mouth: Site of previously extracted tooth #17 and 32 with normal gum appearance and no significant erythema and no bleeding.  Area is tender to palpation.  No significant facial swelling. Eyes:     Conjunctiva/sclera: Conjunctivae normal.  Pulmonary:     Effort: Pulmonary effort is normal.  Musculoskeletal:     Cervical back: Neck supple.  Skin:    Findings: No rash.  Neurological:     Mental Status: She is alert.  Psychiatric:  Mood and Affect: Mood normal.     ED Results / Procedures / Treatments   Labs (all labs ordered are listed, but only abnormal results are displayed) Labs Reviewed - No data to display  EKG None  Radiology No results found.  Procedures Procedures    Medications Ordered in ED Medications - No data to display  ED Course/ Medical Decision Making/ A&P                             Medical Decision Making  BP (!) 134/100 (BP Location: Right Arm)   Pulse 84   Temp 98 F (36.7 C) (Oral)   Resp 18   Ht '5\' 7"'$  (1.702 m)   Wt 90.7 kg   LMP 08/17/2022 (Approximate)   SpO2 100%   BMI 31.32 kg/m   29:19 PM 32 year old female presenting with complaints of dental pain.   Patient report yesterday she had her bilateral lower wisdom teeth removed she reported pain to the affected area started shortly after the anesthesia was subside and have been persistent since.  Patient was prescribed Tylenol 3, a total of 12 pills, and patient states she has gone through her pain medications but it provided minimal relief.  She is also currently pregnant.  She endorsed persistent throbbing achy pain at the surgical site.  No fever.  States that her procedure went smoothly and she did not have any discomfort during the procedure.  On exam, patient has recently extracted tooth #17 and #32 with normal-appearing side no erythema no purulent discharge and no edema noted.  She has normal phonation.  She does appear mildly uncomfortable.  Patient is also here accompanied by her 3 kids.  She is also currently pregnant.  She mention she has taken multiple Tylenol 3 since yesterday but noticed no improvement.  She has tried using gauze to cover the extracted site without relief.  After further discussion, I plan to prescribe patient a short course of oxycodone for pain management.  She will need to follow-up closely with her dentist for outpatient management.        Final Clinical Impression(s) / ED Diagnoses Final diagnoses:  Pain, dental    Rx / DC Orders ED Discharge Orders          Ordered    oxycodone (OXY-IR) 5 MG capsule  Every 6 hours PRN        02/26/23 1751              Domenic Moras, PA-C 02/26/23 1757    Fredia Sorrow, MD 02/26/23 2329

## 2023-02-26 NOTE — ED Triage Notes (Signed)
Patient here POV from Home.  Endorses having Bilateral lower Wisdom Teeth removed yesterday. Began having Pain shortly after anesthesia subsided that is unresponsive to ice and tylenol.   NAD Noted during Triage. A&Ox4. Gcs 15. Ambulatory.

## 2023-02-26 NOTE — ED Notes (Signed)
Reviewed AVS/discharge instruction with patient. Time allotted for and all questions answered. Patient is agreeable for d/c and escorted to ed exit by staff.  

## 2023-03-02 ENCOUNTER — Other Ambulatory Visit: Payer: Self-pay | Admitting: *Deleted

## 2023-03-02 ENCOUNTER — Emergency Department (HOSPITAL_BASED_OUTPATIENT_CLINIC_OR_DEPARTMENT_OTHER)
Admission: EM | Admit: 2023-03-02 | Discharge: 2023-03-02 | Disposition: A | Discharge status: Home / Self Care | Payer: Medicaid Other | Attending: Emergency Medicine | Admitting: Emergency Medicine

## 2023-03-02 ENCOUNTER — Other Ambulatory Visit: Payer: Self-pay

## 2023-03-02 ENCOUNTER — Ambulatory Visit: Payer: Medicaid Other | Attending: Maternal & Fetal Medicine

## 2023-03-02 ENCOUNTER — Encounter: Payer: Self-pay | Admitting: Obstetrics

## 2023-03-02 ENCOUNTER — Ambulatory Visit (INDEPENDENT_AMBULATORY_CARE_PROVIDER_SITE_OTHER): Payer: Medicaid Other | Admitting: Obstetrics

## 2023-03-02 ENCOUNTER — Encounter (HOSPITAL_BASED_OUTPATIENT_CLINIC_OR_DEPARTMENT_OTHER): Payer: Self-pay | Admitting: *Deleted

## 2023-03-02 ENCOUNTER — Ambulatory Visit: Payer: Medicaid Other | Admitting: *Deleted

## 2023-03-02 VITALS — BP 117/69 | HR 84

## 2023-03-02 VITALS — BP 121/74 | HR 87 | Wt 220.6 lb

## 2023-03-02 DIAGNOSIS — O99210 Obesity complicating pregnancy, unspecified trimester: Secondary | ICD-10-CM

## 2023-03-02 DIAGNOSIS — Z3483 Encounter for supervision of other normal pregnancy, third trimester: Secondary | ICD-10-CM

## 2023-03-02 DIAGNOSIS — O365930 Maternal care for other known or suspected poor fetal growth, third trimester, not applicable or unspecified: Secondary | ICD-10-CM

## 2023-03-02 DIAGNOSIS — Z3A28 28 weeks gestation of pregnancy: Secondary | ICD-10-CM | POA: Insufficient documentation

## 2023-03-02 DIAGNOSIS — O34219 Maternal care for unspecified type scar from previous cesarean delivery: Secondary | ICD-10-CM | POA: Insufficient documentation

## 2023-03-02 DIAGNOSIS — O285 Abnormal chromosomal and genetic finding on antenatal screening of mother: Secondary | ICD-10-CM

## 2023-03-02 DIAGNOSIS — D563 Thalassemia minor: Secondary | ICD-10-CM

## 2023-03-02 DIAGNOSIS — K0889 Other specified disorders of teeth and supporting structures: Secondary | ICD-10-CM | POA: Diagnosis present

## 2023-03-02 DIAGNOSIS — O365931 Maternal care for other known or suspected poor fetal growth, third trimester, fetus 1: Secondary | ICD-10-CM

## 2023-03-02 DIAGNOSIS — Z3480 Encounter for supervision of other normal pregnancy, unspecified trimester: Secondary | ICD-10-CM

## 2023-03-02 DIAGNOSIS — G8918 Other acute postprocedural pain: Secondary | ICD-10-CM | POA: Insufficient documentation

## 2023-03-02 DIAGNOSIS — O360130 Maternal care for anti-D [Rh] antibodies, third trimester, not applicable or unspecified: Secondary | ICD-10-CM

## 2023-03-02 DIAGNOSIS — O98513 Other viral diseases complicating pregnancy, third trimester: Secondary | ICD-10-CM | POA: Diagnosis not present

## 2023-03-02 DIAGNOSIS — Z1339 Encounter for screening examination for other mental health and behavioral disorders: Secondary | ICD-10-CM

## 2023-03-02 DIAGNOSIS — O99213 Obesity complicating pregnancy, third trimester: Secondary | ICD-10-CM | POA: Diagnosis not present

## 2023-03-02 DIAGNOSIS — B009 Herpesviral infection, unspecified: Secondary | ICD-10-CM

## 2023-03-02 DIAGNOSIS — E669 Obesity, unspecified: Secondary | ICD-10-CM

## 2023-03-02 MED ORDER — PRENATAL GUMMIES 0.18-25 MG PO CHEW: 3.0000 | CHEWABLE_TABLET | Freq: Every day | ORAL | 12 refills | Status: DC

## 2023-03-02 MED ORDER — ACETAMINOPHEN 325 MG PO TABS
650.0000 mg | ORAL_TABLET | Freq: Once | ORAL | Status: AC
  Administered 2023-03-02: 650 mg via ORAL
  Filled 2023-03-02: qty 2

## 2023-03-02 NOTE — Progress Notes (Signed)
Subjective:  Krystal Murray is a 32 y.o. [phone] at 62w1dbeing seen today for ongoing prenatal care.  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  Patient reports  pain from dental extraction .  Contractions: Irritability. Vag. Bleeding: None.  Movement: Present. Denies leaking of fluid.   The following portions of the patient's history were reviewed and updated as appropriate: allergies, current medications, past family history, past medical history, past social history, past surgical history and problem list. Problem list updated.  Objective:   Vitals:   03/02/23 0851  BP: 121/74  Pulse: 87  Weight: 220 lb 9.6 oz (100.1 kg)    Fetal Status: Fetal Heart Rate (bpm): 140   Movement: Present     General:  Alert, oriented and cooperative. Patient is in no acute distress.  Skin: Skin is warm and dry. No rash noted.   Cardiovascular: Normal heart rate noted  Respiratory: Normal respiratory effort, no problems with respiration noted  Abdomen: Soft, gravid, appropriate for gestational age. Pain/Pressure: Absent     Pelvic:  Cervical exam deferred        Extremities: Normal range of motion.  Edema: Trace  Mental Status: Normal mood and affect. Normal behavior. Normal judgment and thought content.   Urinalysis:      Assessment and Plan:  Pregnancy: [id number] 236w1d1. Supervision of other normal pregnancy, antepartum [Z34.80]   Preterm labor symptoms and general obstetric precautions including but not limited to vaginal bleeding, contractions, leaking of fluid and fetal movement were reviewed in detail with the patient. Please refer  to After Visit Summary for other counseling recommendations.    Return in about 1 week (around 03/09/2023) for ROB, 2 hour OGTT.   HaShelly BombardMD 03/02/23

## 2023-03-02 NOTE — ED Provider Notes (Signed)
   [facility]  Provider Note  CSN: [id number] Arrival date & time: 03/02/23 0032  History Chief Complaint  Patient presents with   Dental Pain    Krystal Murray is a 32 y.o. female who is pregnant. Here for re-evaluation of post-op pain from recent wisdom tooth extraction x 2. She was given APAP #3 post-op which didn't help and was in the ED 3/8 and given oxycodone which she states didn't help. She has been taking APAP at home but more often than recommended. She has not called her oral surgeon. She is concerned about dry socket.    Home Medications Prior to Admission medications   Medication Sig Start Date End Date Taking? Authorizing Provider  amoxicillin (AMOXIL) 500 MG capsule Take 2 capsules (1,000 mg total) by mouth 2 (two) times daily. 01/17/23   Deno Etienne, DO  oxycodone (OXY-IR) 5 MG capsule Take 1 capsule (5 mg total) by mouth every 6 (six) hours as needed. 02/26/23   Domenic Moras, PA-C  Prenatal MV & Min w/FA-DHA (PRENATAL GUMMIES) 0.18-25 MG CHEW Chew 3 tablets by mouth daily. 10/28/22   Shelly Bombard, MD  terconazole (TERAZOL 7) 0.4 % vaginal cream Place 1 applicator vaginally at bedtime. 01/21/23   Shelly Bombard, MD     Allergies    Patient has no known allergies.   Review of Systems   Review of Systems Please see HPI for pertinent positives and negatives  Physical Exam BP 137/83   Pulse 93   Temp 98 F (36.7 C) (Oral)   Resp 16   LMP 08/17/2022 (Approximate)   SpO2 100%   Physical Exam Vitals and nursing note reviewed.  HENT:     Head: Normocephalic.     Nose: Nose normal.     Mouth/Throat:     Mouth: Mucous membranes are moist.     Comments: Dental extraction sites without bleeding, infection or dry socket. No significant trismus.  Eyes:     Extraocular Movements: Extraocular movements intact.  Pulmonary:     Effort: Pulmonary effort is normal.  Musculoskeletal:        General: Normal range of  motion.     Cervical back: Neck supple.  Skin:    Findings: No rash (on exposed skin).  Neurological:     Mental Status: She is alert and oriented to person, place, and time.  Psychiatric:        Mood and Affect: Mood normal.     ED Results / Procedures / Treatments   EKG None  Procedures Procedures  Medications Ordered in the ED Medications  acetaminophen (TYLENOL) tablet 650 mg (has no administration in time range)    Initial Impression and Plan  Patient here with continued post-op dental pain. Advised that due to her pregnancy, NSAIDs contraindicated. APAP is the only viable option for pain control. No indication for additional ED opiate prescription. Cautioned to avoid excessive APAP use and discussed appropriate dosing intervals. Encouraged to call her oral surgeon in the AM for follow up.   ED Course       MDM Rules/Calculators/A&P Medical Decision Making Problems Addressed: Post-op pain: acute illness or injury  Risk OTC drugs.     Final Clinical Impression(s) / ED Diagnoses Final diagnoses:  Post-op pain    Rx / DC Orders ED Discharge Orders     None        Truddie Hidden, MD 03/02/23 [phone]

## 2023-03-02 NOTE — ED Triage Notes (Signed)
Pt is here for re-evaluation of dental pain after bilateral lower wissdom tooth extraction extraction on last Thursday.  Pt has had issues with pain control and has been seen here on Friday due to pain and has ran out of pain meds again.  Pt states that she is very sore and wants to make sure she does not have dry socket.

## 2023-03-02 NOTE — Progress Notes (Signed)
Pt presents for ROB reports dental pain s/p wisdom tooth extraction on Thurs.

## 2023-03-02 NOTE — Addendum Note (Signed)
Addended by: Baltazar Najjar A on: 03/02/2023 10:35 AM   Modules accepted: Orders

## 2023-03-18 ENCOUNTER — Other Ambulatory Visit: Payer: Medicaid Other

## 2023-03-18 ENCOUNTER — Encounter: Payer: Medicaid Other | Admitting: Obstetrics and Gynecology

## 2023-03-25 ENCOUNTER — Encounter: Payer: Self-pay | Admitting: Obstetrics and Gynecology

## 2023-03-25 ENCOUNTER — Ambulatory Visit (INDEPENDENT_AMBULATORY_CARE_PROVIDER_SITE_OTHER): Payer: Medicaid Other | Admitting: Obstetrics and Gynecology

## 2023-03-25 VITALS — BP 113/73 | HR 86 | Wt 221.0 lb

## 2023-03-25 DIAGNOSIS — Z3A31 31 weeks gestation of pregnancy: Secondary | ICD-10-CM | POA: Diagnosis not present

## 2023-03-25 DIAGNOSIS — Z3480 Encounter for supervision of other normal pregnancy, unspecified trimester: Secondary | ICD-10-CM

## 2023-03-25 DIAGNOSIS — B009 Herpesviral infection, unspecified: Secondary | ICD-10-CM

## 2023-03-25 DIAGNOSIS — O26899 Other specified pregnancy related conditions, unspecified trimester: Secondary | ICD-10-CM

## 2023-03-25 DIAGNOSIS — O26893 Other specified pregnancy related conditions, third trimester: Secondary | ICD-10-CM

## 2023-03-25 DIAGNOSIS — O360930 Maternal care for other rhesus isoimmunization, third trimester, not applicable or unspecified: Secondary | ICD-10-CM

## 2023-03-25 DIAGNOSIS — D563 Thalassemia minor: Secondary | ICD-10-CM

## 2023-03-25 DIAGNOSIS — Z6791 Unspecified blood type, Rh negative: Secondary | ICD-10-CM

## 2023-03-25 DIAGNOSIS — Z98891 History of uterine scar from previous surgery: Secondary | ICD-10-CM

## 2023-03-25 HISTORY — DX: Other specified pregnancy related conditions, unspecified trimester: O26.899

## 2023-03-25 MED ORDER — RHO D IMMUNE GLOBULIN 1500 UNIT/2ML IJ SOSY
300.0000 ug | PREFILLED_SYRINGE | Freq: Once | INTRAMUSCULAR | Status: AC
  Administered 2023-03-25: 300 ug via INTRAMUSCULAR

## 2023-03-25 NOTE — Progress Notes (Signed)
Subjective:  Krystal Murray is a 32 y.o. [id number] at [redacted]w[redacted]d being seen today for ongoing prenatal care.  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  Patient reports no complaints.  Contractions: Not present. Vag. Bleeding: None.  Movement: Present. Denies leaking of fluid.   The following portions of the patient's history were reviewed and updated as appropriate: allergies, current medications, past family history, past medical history, past social history, past surgical history and problem list. Problem list updated.  Objective:   Vitals:   03/25/23 1135  BP: 113/73  Pulse: 86  Weight: 221 lb (100.2 kg)    Fetal Status: Fetal Heart Rate (bpm): 143   Movement: Present     General:  Alert, oriented and cooperative. Patient is in no acute distress.  Skin: Skin is warm and dry. No rash noted.   Cardiovascular: Normal heart rate noted  Respiratory: Normal respiratory effort, no problems with respiration noted  Abdomen: Soft, gravid, appropriate for gestational age. Pain/Pressure: Present     Pelvic:  Cervical exam deferred        Extremities: Normal range of motion.  Edema: None  Mental Status: Normal mood and affect. Normal behavior. Normal judgment and thought content.   Urinalysis:      Assessment and Plan:  Pregnancy: [id number] at [redacted]w[redacted]d  1. Supervision of other normal pregnancy, antepartum Stable 1 hr Glucola today and 28 week labs today  2. Alpha thalassemia silent carrier S/P genetic counseling  3. HSV-2 (herpes simplex virus 2) infection Suppress ion at 36 weeks  4. Status post C-section For repeat  at 39 weeks  5. SGA (small for gestational age) Serial growth scans as per MFM protocol  6. Rh negative status during pregnancy in third trimester Rhogam today  Preterm labor symptoms and general obstetric precautions including but not limited to vaginal bleeding, contractions, leaking of fluid and fetal movement were reviewed in detail with the patient. Please refer to After Visit Summary for other counseling recommendations.  Return in about 2 weeks (around 04/08/2023) for OB visit, face to face, MD only.   Chancy Milroy, MD

## 2023-03-25 NOTE — Progress Notes (Addendum)
ROB/GTT, Pt will do 1 Hr. GTT.  Rhogam given in LUOQ tolerated well.  Administrations This Visit     rho (d) immune globulin (RHIG/RHOPHYLAC) injection 300 mcg     Admin Date 03/25/2023 Action Given Dose 300 mcg Route Intramuscular Administered By Tamela Oddi, RMA

## 2023-03-26 LAB — CBC
Hematocrit: 27.9 % — ABNORMAL LOW (ref 34.0–46.6)
Hemoglobin: 9 g/dL — ABNORMAL LOW (ref 11.1–15.9)
MCH: 26.7 pg (ref 26.6–33.0)
MCHC: 32.3 g/dL (ref 31.5–35.7)
MCV: 83 fL (ref 79–97)
Platelets: 162 10*3/uL (ref 150–450)
RBC: 3.37 x10E6/uL — ABNORMAL LOW (ref 3.77–5.28)
RDW: 13 % (ref 11.7–15.4)
WBC: 8.9 10*3/uL (ref 3.4–10.8)

## 2023-03-26 LAB — HIV ANTIBODY (ROUTINE TESTING W REFLEX): HIV Screen 4th Generation wRfx: NONREACTIVE

## 2023-03-26 LAB — GLUCOSE TOLERANCE, 1 HOUR: Glucose, 1Hr PP: 90 mg/dL (ref 70–199)

## 2023-03-26 LAB — RPR: RPR Ser Ql: NONREACTIVE

## 2023-04-01 ENCOUNTER — Ambulatory Visit: Payer: Medicaid Other

## 2023-04-05 ENCOUNTER — Ambulatory Visit: Payer: Medicaid Other | Attending: Maternal & Fetal Medicine

## 2023-04-05 ENCOUNTER — Encounter: Payer: Self-pay | Admitting: *Deleted

## 2023-04-05 ENCOUNTER — Ambulatory Visit: Payer: Medicaid Other | Admitting: *Deleted

## 2023-04-05 VITALS — BP 100/60 | HR 72

## 2023-04-05 DIAGNOSIS — D563 Thalassemia minor: Secondary | ICD-10-CM

## 2023-04-05 DIAGNOSIS — O99213 Obesity complicating pregnancy, third trimester: Secondary | ICD-10-CM | POA: Diagnosis not present

## 2023-04-05 DIAGNOSIS — O360130 Maternal care for anti-D [Rh] antibodies, third trimester, not applicable or unspecified: Secondary | ICD-10-CM | POA: Diagnosis not present

## 2023-04-05 DIAGNOSIS — O365930 Maternal care for other known or suspected poor fetal growth, third trimester, not applicable or unspecified: Secondary | ICD-10-CM | POA: Diagnosis not present

## 2023-04-05 DIAGNOSIS — O285 Abnormal chromosomal and genetic finding on antenatal screening of mother: Secondary | ICD-10-CM

## 2023-04-05 DIAGNOSIS — O34219 Maternal care for unspecified type scar from previous cesarean delivery: Secondary | ICD-10-CM

## 2023-04-05 DIAGNOSIS — Z3480 Encounter for supervision of other normal pregnancy, unspecified trimester: Secondary | ICD-10-CM | POA: Insufficient documentation

## 2023-04-05 DIAGNOSIS — E669 Obesity, unspecified: Secondary | ICD-10-CM

## 2023-04-05 DIAGNOSIS — O365931 Maternal care for other known or suspected poor fetal growth, third trimester, fetus 1: Secondary | ICD-10-CM | POA: Diagnosis not present

## 2023-04-05 DIAGNOSIS — Z3A33 33 weeks gestation of pregnancy: Secondary | ICD-10-CM | POA: Diagnosis not present

## 2023-04-08 ENCOUNTER — Encounter: Payer: Medicaid Other | Admitting: Obstetrics & Gynecology

## 2023-04-13 ENCOUNTER — Encounter: Payer: Medicaid Other | Admitting: Obstetrics

## 2023-04-15 ENCOUNTER — Other Ambulatory Visit (HOSPITAL_COMMUNITY)
Admission: RE | Admit: 2023-04-15 | Discharge: 2023-04-15 | Disposition: A | Discharge status: Home / Self Care | Payer: Medicaid Other | Source: Ambulatory Visit | Attending: Obstetrics & Gynecology | Admitting: Obstetrics & Gynecology

## 2023-04-15 ENCOUNTER — Encounter: Payer: Self-pay | Admitting: Obstetrics and Gynecology

## 2023-04-15 ENCOUNTER — Ambulatory Visit (INDEPENDENT_AMBULATORY_CARE_PROVIDER_SITE_OTHER): Payer: Medicaid Other | Admitting: Obstetrics and Gynecology

## 2023-04-15 VITALS — BP 128/82 | HR 92 | Wt 235.0 lb

## 2023-04-15 DIAGNOSIS — O26893 Other specified pregnancy related conditions, third trimester: Secondary | ICD-10-CM

## 2023-04-15 DIAGNOSIS — D563 Thalassemia minor: Secondary | ICD-10-CM

## 2023-04-15 DIAGNOSIS — Z98891 History of uterine scar from previous surgery: Secondary | ICD-10-CM

## 2023-04-15 DIAGNOSIS — B009 Herpesviral infection, unspecified: Secondary | ICD-10-CM

## 2023-04-15 DIAGNOSIS — Z6791 Unspecified blood type, Rh negative: Secondary | ICD-10-CM

## 2023-04-15 DIAGNOSIS — Z3480 Encounter for supervision of other normal pregnancy, unspecified trimester: Secondary | ICD-10-CM | POA: Insufficient documentation

## 2023-04-15 NOTE — Progress Notes (Signed)
Pt states she is having some d/c. Pt is taking antibiotics currently for dental problem.  Pt would like to know when her c/s will be scheduled so she can make arrangements.

## 2023-04-15 NOTE — Progress Notes (Signed)
Subjective:  Krystal Murray is a 32 y.o. [id number] at [redacted]w[redacted]d being seen today for ongoing prenatal care.  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  Patient reports vaginal irritation.  Contractions: Not present. Vag. Bleeding: None.  Movement: Present. Denies leaking of fluid.   The following portions of the patient's history were reviewed and updated as appropriate: allergies, current medications, past family history, past medical history, past social history, past surgical history and problem list. Problem list updated.  Objective:   Vitals:   04/15/23 0854  BP: 128/82  Pulse: 92  Weight: 235 lb (106.6 kg)    Fetal Status: Fetal Heart Rate (bpm): 140   Movement: Present     General:  Alert, oriented and cooperative. Patient is in no acute distress.  Skin: Skin is warm and dry. No rash noted.   Cardiovascular: Normal heart rate noted  Respiratory: Normal respiratory effort, no problems with respiration noted  Abdomen: Soft, gravid, appropriate for gestational age. Pain/Pressure: Present     Pelvic:   Self swab collected         Extremities: Normal range of motion.     Mental Status: Normal mood and affect. Normal behavior. Normal judgment and thought content.   Urinalysis:      Assessment and Plan:  Pregnancy: [id number] at [redacted]w[redacted]d  1. Supervision of other normal pregnancy, antepartum Stable - Cervicovaginal ancillary only( [place] [facility])  2. Status post C-section Request for repeat at 39 weeks sent  3. Alpha thalassemia silent carrier S/P genetics  4. HSV-2 (herpes simplex virus 2) infection Will start  suppression at next visit  5. Rh negative status during pregnancy in third trimester S/P Rhogam   6. SGA (small for gestational age) Serial growth and antenatal testing as per MFM  Preterm labor symptoms and general obstetric precautions including but not limited to vaginal bleeding, contractions, leaking of fluid and fetal movement were reviewed in detail with the patient. Please refer to After Visit Summary for other counseling recommendations.  Return in about 2 weeks (around 04/29/2023) for OB visit, face to face, any provider.   Hermina Staggers, MD

## 2023-04-16 LAB — CERVICOVAGINAL ANCILLARY ONLY
Bacterial Vaginitis (gardnerella): NEGATIVE
Candida Glabrata: NEGATIVE
Candida Vaginitis: POSITIVE — AB
Chlamydia: NEGATIVE
Comment: NEGATIVE
Comment: NEGATIVE
Comment: NEGATIVE
Comment: NEGATIVE
Comment: NEGATIVE
Comment: NORMAL
Neisseria Gonorrhea: NEGATIVE
Trichomonas: NEGATIVE

## 2023-04-19 ENCOUNTER — Other Ambulatory Visit: Payer: Self-pay

## 2023-04-19 DIAGNOSIS — B379 Candidiasis, unspecified: Secondary | ICD-10-CM

## 2023-04-19 MED ORDER — TERCONAZOLE 0.4 % VA CREA: 1.0000 | TOPICAL_CREAM | Freq: Every day | VAGINAL | 0 refills | Status: DC

## 2023-04-28 ENCOUNTER — Encounter: Payer: Medicaid Other | Admitting: Obstetrics

## 2023-04-29 ENCOUNTER — Ambulatory Visit: Payer: Medicaid Other

## 2023-04-29 ENCOUNTER — Ambulatory Visit: Payer: Medicaid Other | Attending: Maternal & Fetal Medicine

## 2023-04-30 ENCOUNTER — Encounter: Payer: Medicaid Other | Admitting: Obstetrics

## 2023-05-01 ENCOUNTER — Encounter (HOSPITAL_COMMUNITY): Payer: Self-pay | Admitting: Obstetrics and Gynecology

## 2023-05-01 ENCOUNTER — Encounter (HOSPITAL_BASED_OUTPATIENT_CLINIC_OR_DEPARTMENT_OTHER): Payer: Self-pay | Admitting: Emergency Medicine

## 2023-05-01 ENCOUNTER — Inpatient Hospital Stay (EMERGENCY_DEPARTMENT_HOSPITAL)
Admission: AD | Admit: 2023-05-01 | Discharge: 2023-05-02 | Disposition: A | Discharge status: Home / Self Care | Payer: Medicaid Other | Source: Home / Self Care | Attending: Obstetrics and Gynecology | Admitting: Obstetrics and Gynecology

## 2023-05-01 ENCOUNTER — Emergency Department (HOSPITAL_BASED_OUTPATIENT_CLINIC_OR_DEPARTMENT_OTHER)
Admission: EM | Admit: 2023-05-01 | Discharge: 2023-05-01 | Disposition: A | Discharge status: Home / Self Care | Payer: Medicaid Other | Attending: Emergency Medicine | Admitting: Emergency Medicine

## 2023-05-01 ENCOUNTER — Other Ambulatory Visit: Payer: Self-pay

## 2023-05-01 DIAGNOSIS — R519 Headache, unspecified: Secondary | ICD-10-CM | POA: Insufficient documentation

## 2023-05-01 DIAGNOSIS — O99013 Anemia complicating pregnancy, third trimester: Secondary | ICD-10-CM | POA: Insufficient documentation

## 2023-05-01 DIAGNOSIS — O26893 Other specified pregnancy related conditions, third trimester: Secondary | ICD-10-CM | POA: Diagnosis not present

## 2023-05-01 DIAGNOSIS — Z3A37 37 weeks gestation of pregnancy: Secondary | ICD-10-CM | POA: Diagnosis not present

## 2023-05-01 DIAGNOSIS — Z3A36 36 weeks gestation of pregnancy: Secondary | ICD-10-CM

## 2023-05-01 DIAGNOSIS — D649 Anemia, unspecified: Secondary | ICD-10-CM

## 2023-05-01 HISTORY — DX: Personal history of other infectious and parasitic diseases: Z86.19

## 2023-05-01 LAB — CBC WITH DIFFERENTIAL/PLATELET
Abs Immature Granulocytes: 0.03 10*3/uL (ref 0.00–0.07)
Basophils Absolute: 0 10*3/uL (ref 0.0–0.1)
Basophils Relative: 0 %
Eosinophils Absolute: 0.1 10*3/uL (ref 0.0–0.5)
Eosinophils Relative: 2 %
HCT: 26.1 % — ABNORMAL LOW (ref 36.0–46.0)
Hemoglobin: 8.1 g/dL — ABNORMAL LOW (ref 12.0–15.0)
Immature Granulocytes: 0 %
Lymphocytes Relative: 30 %
Lymphs Abs: 2.1 10*3/uL (ref 0.7–4.0)
MCH: 25.4 pg — ABNORMAL LOW (ref 26.0–34.0)
MCHC: 31 g/dL (ref 30.0–36.0)
MCV: 81.8 fL (ref 80.0–100.0)
Monocytes Absolute: 0.9 10*3/uL (ref 0.1–1.0)
Monocytes Relative: 13 %
Neutro Abs: 3.7 10*3/uL (ref 1.7–7.7)
Neutrophils Relative %: 55 %
Platelets: 175 10*3/uL (ref 150–400)
RBC: 3.19 MIL/uL — ABNORMAL LOW (ref 3.87–5.11)
RDW: 15.6 % — ABNORMAL HIGH (ref 11.5–15.5)
WBC: 6.8 10*3/uL (ref 4.0–10.5)
nRBC: 0 % (ref 0.0–0.2)

## 2023-05-01 LAB — URINALYSIS, ROUTINE W REFLEX MICROSCOPIC
Bilirubin Urine: NEGATIVE
Bilirubin Urine: NEGATIVE
Glucose, UA: NEGATIVE mg/dL
Glucose, UA: NEGATIVE mg/dL
Hgb urine dipstick: NEGATIVE
Hgb urine dipstick: NEGATIVE
Ketones, ur: NEGATIVE mg/dL
Ketones, ur: NEGATIVE mg/dL
Leukocytes,Ua: NEGATIVE
Leukocytes,Ua: NEGATIVE
Nitrite: NEGATIVE
Nitrite: NEGATIVE
Protein, ur: 30 mg/dL — AB
Protein, ur: 30 mg/dL — AB
Specific Gravity, Urine: 1.031 — ABNORMAL HIGH (ref 1.005–1.030)
Specific Gravity, Urine: 1.019 (ref 1.005–1.030)
pH: 6 (ref 5.0–8.0)
pH: 6 (ref 5.0–8.0)

## 2023-05-01 LAB — COMPREHENSIVE METABOLIC PANEL
ALT: <5 U/L (ref 0–44)
AST: 9 U/L — ABNORMAL LOW (ref 15–41)
Albumin: 3.2 g/dL — ABNORMAL LOW (ref 3.5–5.0)
Alkaline Phosphatase: 77 U/L (ref 38–126)
Anion gap: 10 (ref 5–15)
BUN: 9 mg/dL (ref 6–20)
CO2: 20 mmol/L — ABNORMAL LOW (ref 22–32)
Calcium: 8.6 mg/dL — ABNORMAL LOW (ref 8.9–10.3)
Chloride: 108 mmol/L (ref 98–111)
Creatinine, Ser: 0.53 mg/dL (ref 0.44–1.00)
GFR, Estimated: >60 mL/min (ref >60)
Glucose, Bld: 109 mg/dL — ABNORMAL HIGH (ref 70–99)
Potassium: 3.4 mmol/L — ABNORMAL LOW (ref 3.5–5.1)
Sodium: 138 mmol/L (ref 135–145)
Total Bilirubin: 0.5 mg/dL (ref 0.3–1.2)
Total Protein: 6.6 g/dL (ref 6.5–8.1)

## 2023-05-01 MED ORDER — CYCLOBENZAPRINE HCL 5 MG PO TABS
10.0000 mg | ORAL_TABLET | Freq: Once | ORAL | Status: AC
  Administered 2023-05-01: 10 mg via ORAL
  Filled 2023-05-01: qty 2

## 2023-05-01 MED ORDER — SODIUM CHLORIDE 0.9 % IV SOLN: INTRAVENOUS | Status: DC | PRN

## 2023-05-01 MED ORDER — SODIUM CHLORIDE 0.9 % IV BOLUS
1000.0000 mL | Freq: Once | INTRAVENOUS | Status: AC
  Administered 2023-05-01: 1000 mL via INTRAVENOUS

## 2023-05-01 MED ORDER — CYCLOBENZAPRINE HCL 10 MG PO TABS: 10.0000 mg | ORAL_TABLET | Freq: Two times a day (BID) | ORAL | 0 refills | Status: DC | PRN

## 2023-05-01 MED ORDER — SODIUM CHLORIDE 0.9 % IV BOLUS: 1000.0000 mL | Freq: Once | INTRAVENOUS | Status: DC

## 2023-05-01 MED ORDER — MAGNESIUM SULFATE IN D5W 1-5 GM/100ML-% IV SOLN
1.0000 g | Freq: Once | INTRAVENOUS | Status: AC
  Administered 2023-05-01: 1 g via INTRAVENOUS
  Filled 2023-05-01: qty 100

## 2023-05-01 MED ORDER — DIPHENHYDRAMINE HCL 25 MG PO CAPS
25.0000 mg | ORAL_CAPSULE | Freq: Once | ORAL | Status: AC
  Administered 2023-05-01: 25 mg via ORAL
  Filled 2023-05-01: qty 1

## 2023-05-01 NOTE — ED Provider Notes (Signed)
[place] [facility] EMERGENCY DEPARTMENT AT [facility]  Provider Note  CSN: [id number] Arrival date & time: 05/01/23 0549  History Chief Complaint  Patient presents with   Headache    Krystal Murray is a 32 y.o. female who is G7P4A2 at about [redacted]wks gestation presents for evaluation of headache off and on for the last 36 hours or so, some blurry vision. No nausea or vomiting. No prior history of same. Denies any fevers. No abdominal pain, contractions, vaginal bleeding or gush of fluid. She is scheduled for C-section in 2 weeks. Has taken some APAP with minimal relief.    Home Medications Prior to Admission medications   Medication Sig Start Date End Date Taking? Authorizing Provider  acetaminophen-codeine (TYLENOL #3) 300-30 MG tablet Take 1 tablet by mouth every 6 (six) hours as needed. 02/25/23   [provider]  Prenatal MV & Min w/FA-DHA (PRENATAL GUMMIES) 0.18-25 MG CHEW Chew 3 tablets by mouth daily. 03/02/23   Brock Bad, MD  terconazole (TERAZOL 7) 0.4 % vaginal cream Place 1 applicator vaginally at bedtime. 04/19/23   Hermina Staggers, MD     Allergies    Patient has no known allergies.   Review of Systems   Review of Systems Please see HPI for pertinent positives and negatives  Physical Exam BP 130/82   Pulse 78   Temp 97.9 F (36.6 C)   Resp 18   Ht 5\' 7"  (1.702 m)   Wt 106.6 kg   LMP 08/17/2022 (Approximate)   SpO2 100%   BMI 36.81 kg/m   Physical Exam Vitals and nursing note reviewed.  Constitutional:      Appearance: Normal appearance.  HENT:     Head: Normocephalic and atraumatic.     Nose: Nose normal.     Mouth/Throat:     Mouth: Mucous membranes are moist.  Eyes:     Extraocular Movements: Extraocular movements intact.     Conjunctiva/sclera: Conjunctivae normal.  Cardiovascular:     Rate and Rhythm: Normal rate.  Pulmonary:     Effort: Pulmonary effort is normal.     Breath sounds: Normal breath sounds.  Abdominal:      General: Abdomen is flat.     Palpations: Abdomen is soft.     Tenderness: There is no abdominal tenderness.     Comments: Gravid, consistent with dates  Musculoskeletal:        General: No swelling. Normal range of motion.     Cervical back: Neck supple.  Skin:    General: Skin is warm and dry.  Neurological:     General: No focal deficit present.     Mental Status: She is alert and oriented to person, place, and time.     Cranial Nerves: No cranial nerve deficit.     Sensory: No sensory deficit.     Motor: No weakness.  Psychiatric:        Mood and Affect: Mood normal.     ED Results / Procedures / Treatments   EKG None  Procedures Procedures  Medications Ordered in the ED Medications  sodium chloride 0.9 % bolus 1,000 mL (1,000 mLs Intravenous New Bag/Given 05/01/23 4098)    Initial Impression and Plan  Patient in third trimester here for headache. Initial BP in triage is mildly elevated, repeat is improved. Spoke with Dr. Berton Lan, Ob/Gyn, who recommends checking HELLP labs and monitoring BP here. If not further elevated readings and labs unremarkable, symptoms can be addressed as an  outpatient.   ED Course   Clinical Course as of 05/01/23 0701  Sat May 01, 2023  9147 CBC with anemia, slight decrease from previous. PLT are normal.  [CS]  0700 Care of the patient signed out to the oncoming team at the change of shift  [CS]    Clinical Course User Index [CS] Pollyann Savoy, MD     MDM Rules/Calculators/A&P Medical Decision Making Problems Addressed: Pregnancy headache in third trimester: acute illness or injury  Amount and/or Complexity of Data Reviewed Labs: ordered. Decision-making details documented in ED Course.  Risk Decision regarding hospitalization.     Final Clinical Impression(s) / ED Diagnoses Final diagnoses:  Pregnancy headache in third trimester    Rx / DC Orders ED Discharge Orders     None        Pollyann Savoy,  MD 05/01/23 [phone]

## 2023-05-01 NOTE — MAU Provider Note (Signed)
History     CSN: [id number]  Arrival date and time: 05/01/23 2103   Event Date/Time   First Provider Initiated Contact with Patient 05/01/23 2206      Chief Complaint  Patient presents with   Hypertension   Headache   HPI  Krystal Murray is a 32 y.o. [id number] at [redacted]w[redacted]d who presents for evaluation of a headache. Patient was seen at [facility] ED this am for the same complaint. She states they gave her benedryl which made her sleep but did not help her head. She reports she continued to have the headache all day and when she woke up from her nap, it was worse. She tried a tylenol with some relief. Patient rates the pain as a 10/10. She denies any visual changes or epigastric pain. Her preeclampsia labs were normal this am. She denies any vaginal bleeding, discharge, and leaking of fluid. Denies any constipation, diarrhea or any urinary complaints. Reports normal fetal movement.   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    UTI (lower urinary tract infection)     Past Surgical History:  Procedure Laterality Date   CESAREAN SECTION N/A 02/07/2018   Procedure: CESAREAN SECTION;  Surgeon: Hermina Staggers, MD;  Location: [facility]IR[facility] SU[facility];  Service: Obstetrics;  Laterality: N/A;   CESAREAN SECTION  01/28/2020    Family History  Problem Relation Age of Onset   Cancer Maternal Grandmother        breastt   Cancer Maternal Aunt        uterine   Diabetes Maternal Aunt    Hypertension Maternal Aunt    Cancer Cousin        leaukemia    Social History   Tobacco Use   Smoking status: Never    Passive exposure: Never   Smokeless tobacco: Never  Vaping Use   Vaping Use: Never used  Substance Use Topics   Alcohol use: Not Currently     Comment: not since confirmed pregnancy   Drug use: Yes    Types: Marijuana    Comment: not since confirmed pregnancy    Allergies: No Known Allergies  Medications Prior to Admission  Medication Sig Dispense Refill Last Dose   acetaminophen (TYLENOL) 500 MG tablet Take 1,000 mg by mouth every 6 (six) hours as needed for headache.   05/01/2023 at 0500   acetaminophen-codeine (TYLENOL #3) 300-30 MG tablet Take 1 tablet by mouth every 6 (six) hours as needed.      Prenatal MV & Min w/FA-DHA (PRENATAL GUMMIES) 0.18-25 MG CHEW Chew 3 tablets by mouth daily. 90 tablet 12    terconazole (TERAZOL 7) 0.4 % vaginal cream Place 1 applicator vaginally at bedtime. 45 g 0     Review of Systems  Constitutional: Negative.  Negative for fatigue and fever.  HENT: Negative.    Respiratory: Negative.  Negative for shortness of breath.   Cardiovascular: Negative.  Negative for chest pain.  Gastrointestinal: Negative.  Negative for abdominal pain,  constipation, diarrhea, nausea and vomiting.  Genitourinary: Negative.  Negative for dysuria, vaginal bleeding and vaginal discharge.  Neurological:  Positive for headaches. Negative for dizziness.   Physical Exam   Blood pressure (!) 109/55, pulse 78, temperature 98.3 F (36.8 C), temperature source Oral, resp. rate 18, height 5\' 7"  (1.702 m), weight 104.3 kg, last menstrual period 08/17/2022, SpO2 98 %, currently breastfeeding.  Patient Vitals for the past 24 hrs:  BP Temp Temp src Pulse Resp SpO2 Height Weight  05/01/23 2316 (!) 109/55 -- -- 78 -- -- -- --  05/01/23 2306 -- -- -- -- -- 98 % -- --  05/01/23 2300 (!) 112/59 -- -- 78 -- 99 % -- --  05/01/23 2256 -- -- -- -- -- 98 % -- --  05/01/23 2251 -- -- -- -- -- 98 % -- --  05/01/23 2246 (!) 104/56 -- -- 79 -- 98 % -- --  05/01/23 2241 -- -- -- -- -- 99 % -- --  05/01/23 2236 -- -- -- -- -- 99 % -- --  05/01/23 2231 111/61 -- -- 94 -- 99 % -- --  05/01/23 2226 -- -- -- -- -- 98 % -- --  05/01/23 2221  -- -- -- -- -- 99 % -- --  05/01/23 2216 (!) 113/57 -- -- 86 -- 99 % -- --  05/01/23 2211 -- -- -- -- -- 99 % -- --  05/01/23 2206 -- -- -- -- -- 98 % -- --  05/01/23 2201 123/75 -- -- 97 -- 99 % -- --  05/01/23 2156 126/75 -- -- 90 -- 100 % -- --  05/01/23 2126 130/80 98.3 F (36.8 C) Oral (!) 111 18 100 % 5\' 7"  (1.702 m) 104.3 kg    Physical Exam Vitals and nursing note reviewed.  Constitutional:      General: She is not in acute distress.    Appearance: She is well-developed.  HENT:     Head: Normocephalic.  Eyes:     Pupils: Pupils are equal, round, and reactive to light.  Cardiovascular:     Rate and Rhythm: Normal rate and regular rhythm.     Heart sounds: Normal heart sounds.  Pulmonary:     Effort: Pulmonary effort is normal. No respiratory distress.     Breath sounds: Normal breath sounds.  Abdominal:     General: Bowel sounds are normal. There is no distension.     Palpations: Abdomen is soft.     Tenderness: There is no abdominal tenderness.  Skin:    General: Skin is warm and dry.  Neurological:     Mental Status: She is alert and oriented to person, place, and time.  Psychiatric:        Mood and Affect: Mood normal.        Behavior: Behavior normal.        Thought Content: Thought content normal.        Judgment: Judgment normal.     Fetal Tracing:  Baseline: 125 Variability: moderate Accels: 15x15 Decels: none  Toco: occasional uc's  MAU Course  Procedures  Results for orders placed or performed during the hospital encounter of 05/01/23 (from the past 24 hour(s))  Urinalysis, Routine w reflex microscopic -Urine, Clean Catch     Status: Abnormal   Collection Time: 05/01/23  9:18 PM  Result Value Ref Range   Color, Urine YELLOW YELLOW   APPearance HAZY (A) CLEAR   Specific Gravity, Urine 1.019 1.005 - 1.030  pH 6.0 5.0 - 8.0   Glucose, UA NEGATIVE NEGATIVE mg/dL   Hgb urine dipstick NEGATIVE NEGATIVE   Bilirubin Urine NEGATIVE NEGATIVE    Ketones, ur NEGATIVE NEGATIVE mg/dL   Protein, ur 30 (A) NEGATIVE mg/dL   Nitrite NEGATIVE NEGATIVE   Leukocytes,Ua NEGATIVE NEGATIVE   RBC / HPF 0-5 0 - 5 RBC/hpf   WBC, UA 6-10 0 - 5 WBC/hpf   Bacteria, UA MANY (A) NONE SEEN   Squamous Epithelial / HPF 11-20 0 - 5 /HPF   Mucus PRESENT   Protein / creatinine ratio, urine     Status: Abnormal   Collection Time: 05/01/23  9:18 PM  Result Value Ref Range   Creatinine, Urine 161 mg/dL   Total Protein, Urine 34 mg/dL   Protein Creatinine Ratio 0.21 (H) 0.00 - 0.15 mg/mg[Cre]    MDM Labs ordered and reviewed.   UA PCR added to labs from this am Flexeril PO BP in MAU normal  Patient reports complete resolution of HA  Assessment and Plan   1. Pregnancy headache in third trimester   2. [redacted] weeks gestation of pregnancy     -Discharge home in stable condition -Rx for flexeril sent to pharmacy -Third trimester precautions discussed -Patient advised to follow-up with OB as scheduled for prenatal care -Patient may return to MAU as needed or if her condition were to change or worsen  Rolm Bookbinder, CNM 05/02/2023, 10:06 PM

## 2023-05-01 NOTE — Discharge Instructions (Addendum)
You were seen in the emergency department for your headache.  You had 1 high blood pressure and the rest of your blood pressures were normal and your labs were normal making preeclampsia less likely.  OB/GYN should be scheduling you an appointment for Monday for blood pressure recheck however if you do not receive a call from them you should call to schedule for blood pressure recheck.  You should return to the MAU (maternal emergency department) if your having worsening headaches, worsening vision changes, severe abdominal pain or any other new or concerning symptoms.

## 2023-05-01 NOTE — ED Notes (Signed)
Paged OB/GYN for Dr Theresia Lo.-ABB(NS)

## 2023-05-01 NOTE — ED Notes (Signed)
Dc instructions reviewed with patient. Patient voiced understanding. Dc with belongings.  °

## 2023-05-01 NOTE — Discharge Instructions (Signed)

## 2023-05-01 NOTE — ED Provider Notes (Signed)
Patient signed out to me at 07 100 by Dr. Bernette Mayers pending labs and reassessment.  In short this is a 32 year old female at [redacted] weeks gestation presenting to the emergency department with headache.  Patient was initially mildly hypertensive on arrival to 140 systolic and otherwise hemodynamically stable with no focal neurologic deficits.  Dr. Bernette Mayers spoke with OB/GYN who recommended labs, fluids and repeat vital signs to further evaluate for preeclampsia or help syndrome.  Patient's blood pressure on my arrival has improved to 130/82.  Labs are pending at this time.  Clinical Course as of 05/01/23 0916  Sat May 01, 2023  0633 CBC with anemia, slight decrease from previous. PLT are normal.  [CS]  0700 Care of the patient signed out to the oncoming team at the change of shift  [CS]  0742 Upon my evaluation patient reports continued headache. BP is now 112 systolic. UA is pending. She took Tylenol just prior to arrival and had 1 L IVF. She will additionally be trailed magnesium and benadryl for headache cocktail safe in pregnancy. Patient is awake and alert without focal neurologic deficits at this time. [VK]  [phone] UA with bacteruria and multiple squams, urine culture will be sent for concern for asymptomatic bacteruria in pregnancy. 30 of protein in the UA. I will plan to discuss with GYN for recommendations on disposition. [VK]  0800 I spoke with Dr. Berton Lan state if she has improvement of headache with medications, she can be discharged and they will schedule follow up for BP check on Monday. She should follow up at MAU for worsening headaches, blurred vision, RUQ pain. [VK]  0913 Upon reassessment, the patient is asleep resting comfortably and headache has resolved.  She is stable for discharge with OB/GYN follow-up and was given strict return precautions. [VK]    Clinical Course User Index [CS] Pollyann Savoy, MD [VK] Rexford Maus, DO      Rexford Maus, [place] 05/01/23 [phone]

## 2023-05-01 NOTE — ED Triage Notes (Signed)
Pt c/o headache and blurry vision since Thursday. Pt states that she has been having cold symptoms since Tuesday. Pt is [redacted] weeks pregnant, no complications with the pregnancy.

## 2023-05-01 NOTE — MAU Note (Signed)
.  Krystal Murray is a 32 y.o. at [redacted]w[redacted]d here in MAU reporting: HA, blurry vision, elevated bp of 145/98 @1950 , intermittent ABD pain all over that is quick and sudden, and intermittent aching lower back pain since Thursday. Pt states she went to Urgent Care around 0500 today for some complaints, was evaluated and treated, sent home with precautions to come to MAU if continues, and patient tried to rest at home. Pt took Tylenol 1000mg  at 1700, some relief. Pt reports since 2000 she has felt worse. Pt denies VB, LOF, DFM, and complications in the pregnancy.  C/S x2  Onset of complaint: ongoing  Pain score: 10/10 HA, 7/10 back Vitals:   05/01/23 2126  BP: 130/80  Pulse: (!) 111  Resp: 18  Temp: 98.3 F (36.8 C)  SpO2: 100%     FHT:130 Lab orders placed from triage:  UA

## 2023-05-02 ENCOUNTER — Encounter: Payer: Self-pay | Admitting: Obstetrics

## 2023-05-02 LAB — PROTEIN / CREATININE RATIO, URINE
Creatinine, Urine: 161 mg/dL
Protein Creatinine Ratio: 0.21 mg/mg{Cre} — ABNORMAL HIGH (ref 0.00–0.15)
Total Protein, Urine: 34 mg/dL

## 2023-05-02 LAB — URINE CULTURE: Culture: NO GROWTH

## 2023-05-03 ENCOUNTER — Encounter: Payer: Self-pay | Admitting: Obstetrics

## 2023-05-04 ENCOUNTER — Ambulatory Visit: Payer: Medicaid Other

## 2023-05-04 ENCOUNTER — Inpatient Hospital Stay (HOSPITAL_COMMUNITY)
Admission: AD | Admit: 2023-05-04 | Discharge: 2023-05-04 | Disposition: A | Discharge status: Home / Self Care | Payer: Medicaid Other | Attending: Obstetrics & Gynecology | Admitting: Obstetrics & Gynecology

## 2023-05-04 VITALS — BP 122/81 | HR 111 | Wt 236.8 lb

## 2023-05-04 DIAGNOSIS — Z3A37 37 weeks gestation of pregnancy: Secondary | ICD-10-CM | POA: Insufficient documentation

## 2023-05-04 DIAGNOSIS — R519 Headache, unspecified: Secondary | ICD-10-CM | POA: Insufficient documentation

## 2023-05-04 DIAGNOSIS — O99019 Anemia complicating pregnancy, unspecified trimester: Secondary | ICD-10-CM | POA: Insufficient documentation

## 2023-05-04 DIAGNOSIS — O26893 Other specified pregnancy related conditions, third trimester: Secondary | ICD-10-CM | POA: Insufficient documentation

## 2023-05-04 DIAGNOSIS — Z3480 Encounter for supervision of other normal pregnancy, unspecified trimester: Secondary | ICD-10-CM

## 2023-05-04 DIAGNOSIS — O99013 Anemia complicating pregnancy, third trimester: Secondary | ICD-10-CM | POA: Insufficient documentation

## 2023-05-04 DIAGNOSIS — Z87440 Personal history of urinary (tract) infections: Secondary | ICD-10-CM | POA: Insufficient documentation

## 2023-05-04 HISTORY — DX: Anemia complicating pregnancy, unspecified trimester: O99.019

## 2023-05-04 LAB — PROTEIN / CREATININE RATIO, URINE
Creatinine, Urine: 35 mg/dL
Total Protein, Urine: <6 mg/dL

## 2023-05-04 LAB — COMPREHENSIVE METABOLIC PANEL
ALT: 8 U/L (ref 0–44)
AST: 15 U/L (ref 15–41)
Albumin: 2.6 g/dL — ABNORMAL LOW (ref 3.5–5.0)
Alkaline Phosphatase: 78 U/L (ref 38–126)
Anion gap: 10 (ref 5–15)
BUN: 8 mg/dL (ref 6–20)
CO2: 17 mmol/L — ABNORMAL LOW (ref 22–32)
Calcium: 8.8 mg/dL — ABNORMAL LOW (ref 8.9–10.3)
Chloride: 108 mmol/L (ref 98–111)
Creatinine, Ser: 0.6 mg/dL (ref 0.44–1.00)
GFR, Estimated: >60 mL/min (ref >60)
Glucose, Bld: 84 mg/dL (ref 70–99)
Potassium: 3.5 mmol/L (ref 3.5–5.1)
Sodium: 135 mmol/L (ref 135–145)
Total Bilirubin: 0.5 mg/dL (ref 0.3–1.2)
Total Protein: 6.5 g/dL (ref 6.5–8.1)

## 2023-05-04 LAB — CBC
HCT: 27.1 % — ABNORMAL LOW (ref 36.0–46.0)
Hemoglobin: 8.3 g/dL — ABNORMAL LOW (ref 12.0–15.0)
MCH: 24.6 pg — ABNORMAL LOW (ref 26.0–34.0)
MCHC: 30.6 g/dL (ref 30.0–36.0)
MCV: 80.2 fL (ref 80.0–100.0)
Platelets: 202 10*3/uL (ref 150–400)
RBC: 3.38 MIL/uL — ABNORMAL LOW (ref 3.87–5.11)
RDW: 15.4 % (ref 11.5–15.5)
WBC: 10.3 10*3/uL (ref 4.0–10.5)
nRBC: 0 % (ref 0.0–0.2)

## 2023-05-04 MED ORDER — POLYSACCHARIDE IRON COMPLEX 150 MG PO CAPS: 150.0000 mg | ORAL_CAPSULE | Freq: Every day | ORAL | 1 refills | Status: DC

## 2023-05-04 MED ORDER — METOCLOPRAMIDE HCL 10 MG PO TABS: 10.0000 mg | ORAL_TABLET | Freq: Four times a day (QID) | ORAL | 0 refills | Status: DC

## 2023-05-04 MED ORDER — ACETAMINOPHEN-CAFFEINE 500-65 MG PO TABS
2.0000 | ORAL_TABLET | Freq: Once | ORAL | Status: AC
  Administered 2023-05-04: 2 via ORAL
  Filled 2023-05-04: qty 2

## 2023-05-04 MED ORDER — METOCLOPRAMIDE HCL 10 MG PO TABS
10.0000 mg | ORAL_TABLET | Freq: Once | ORAL | Status: AC
  Administered 2023-05-04: 10 mg via ORAL
  Filled 2023-05-04: qty 1

## 2023-05-04 NOTE — MAU Provider Note (Signed)
History     CSN: [id number]  Arrival date and time: 05/04/23 1629   None     Chief Complaint  Patient presents with   Headache   Hypertension   HPI Krystal Murray is a 32 y.o. [id number] at [redacted]w[redacted]d who presents to MAU for headache. She reports a left sided headed started on Thursday. She took extra strength Tylenol yesterday and Flexeril today around 1230 without any relief. She reports sensitivity to light. She denies history of migraines. She reports BP at home 140s/90s. She reports some blurry vision with the headache. No RUQ/epigastric pain or increased swelling. She denies contractions, vaginal bleeding, or leaking fluid. Reports normal fetal movement.  Patient receives [facility] at [facility], next appointment is scheduled on Friday 5/17.    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    UTI (lower urinary tract infection)     Past Surgical History:  Procedure Laterality Date   CESAREAN SECTION N/A 02/07/2018   Procedure: CESAREAN SECTION;  Surgeon: Hermina Staggers, MD;  Location: [facility] BIRTHING SUITES;  Service: Obstetrics;  Laterality: N/A;   CESAREAN SECTION  01/28/2020    Family History  Problem Relation Age of Onset   Cancer Maternal Grandmother        breastt   Cancer Maternal Aunt        uterine   Diabetes Maternal Aunt    Hypertension Maternal Aunt    Cancer Cousin        leaukemia    Social History   Tobacco Use   Smoking status: Never    Passive exposure: Never   Smokeless tobacco: Never  Vaping Use   Vaping Use: Never used  Substance Use Topics   Alcohol use: Not Currently    Comment: not since confirmed pregnancy   Drug use: Yes    Types: Marijuana    Comment: not since confirmed pregnancy    Allergies: No  Known Allergies  No medications prior to admission.   Review of Systems  Constitutional: Negative.   Eyes:  Positive for photophobia and visual disturbance.  Neurological:  Positive for headaches.  All other systems reviewed and are negative.  Physical Exam  Patient Vitals for the past 24 hrs:  BP Temp Temp src Pulse Resp SpO2 Height Weight  05/04/23 1839 138/78 -- -- 100 -- -- -- --  05/04/23 1801 119/87 -- -- 100 -- -- -- --  05/04/23 1752 124/82 -- -- 100 -- -- -- --  05/04/23 1746 114/74 -- -- (!) 101 -- -- -- --  05/04/23 1735 117/73 98.4 F (36.9 C) -- 99 18 -- -- --  05/04/23 1706 129/86 98.3 F (36.8 C) Oral 93 20 100 % 5\' 7"  (1.702 m) 107.4 kg   Physical Exam Vitals and nursing note reviewed.  Constitutional:      General: She is not in acute distress. Eyes:     Extraocular Movements: Extraocular movements intact.     Pupils: Pupils are equal, round, and reactive  to light.  Cardiovascular:     Rate and Rhythm: Normal rate.  Pulmonary:     Effort: Pulmonary effort is normal.  Abdominal:     Comments: Gravid   Musculoskeletal:        General: Normal range of motion.     Cervical back: Normal range of motion.  Skin:    General: Skin is warm and dry.  Neurological:     General: No focal deficit present.     Mental Status: She is alert and oriented to person, place, and time.     Cranial Nerves: Cranial nerves 2-12 are intact. No cranial nerve deficit.     Motor: No weakness.     Coordination: Coordination is intact.     Gait: Gait is intact.     Deep Tendon Reflexes: Reflexes normal.  Psychiatric:        Mood and Affect: Mood normal.        Speech: Speech normal.        Behavior: Behavior normal.    NST FHR: 140 bpm, moderate variability, +15x15 accels, no decels Toco: quiet  Results for orders placed or performed during the hospital encounter of 05/04/23 (from the past 24 hour(s))  CBC     Status: Abnormal   Collection Time: 05/04/23  4:59 PM  Result  Value Ref Range   WBC 10.3 4.0 - 10.5 K/uL   RBC 3.38 (L) 3.87 - 5.11 MIL/uL   Hemoglobin 8.3 (L) 12.0 - 15.0 g/dL   HCT 16.1 (L) 09.6 - 04.5 %   MCV 80.2 80.0 - 100.0 fL   MCH 24.6 (L) 26.0 - 34.0 pg   MCHC 30.6 30.0 - 36.0 g/dL   RDW 40.9 81.1 - 91.4 %   Platelets 202 150 - 400 K/uL   nRBC 0.0 0.0 - 0.2 %  Comprehensive metabolic panel     Status: Abnormal   Collection Time: 05/04/23  4:59 PM  Result Value Ref Range   Sodium 135 135 - 145 mmol/L   Potassium 3.5 3.5 - 5.1 mmol/L   Chloride 108 98 - 111 mmol/L   CO2 17 (L) 22 - 32 mmol/L   Glucose, Bld 84 70 - 99 mg/dL   BUN 8 6 - 20 mg/dL   Creatinine, Ser 7.82 0.44 - 1.00 mg/dL   Calcium 8.8 (L) 8.9 - 10.3 mg/dL   Total Protein 6.5 6.5 - 8.1 g/dL   Albumin 2.6 (L) 3.5 - 5.0 g/dL   AST 15 15 - 41 U/L   ALT 8 0 - 44 U/L   Alkaline Phosphatase 78 38 - 126 U/L   Total Bilirubin 0.5 0.3 - 1.2 mg/dL   GFR, Estimated >95 >62 mL/min   Anion gap 10 5 - 15  Protein / creatinine ratio, urine     Status: None   Collection Time: 05/04/23  5:12 PM  Result Value Ref Range   Creatinine, Urine 35 mg/dL   Total Protein, Urine <6 mg/dL   Protein Creatinine Ratio        0.00 - 0.15 mg/mg[Cre]     MAU Course  Procedures  MDM CBC, CMP, UPCR Serial BP's NST Excedrin tension and Reglan  Patient has her 4 children present with her. I discussed limitations of treatment for headache unless someone is available to care for her children during treatment. She reports she has no one to watch her children until tomorrow when her mother returns home.   BP's normotensive during MAU visit. PEC labs  reassuring although patient noted to have anemia, however similar to previous results. I asked patient if she was aware of anemia diagnosis and she reports she was unaware. She is not taking iron supplementation. I offered IV iron as well as IV medications to help improve headache, but patient declines IV. Patient given PO Excedrin Tension and Reglan  which she reports resolved her headache fully. I discussed with patient headache unlikely related to PEC as BPs and labs are normal but could be attributed to migraine and/or anemia. NST reactive. Toco quiet.  Assessment and Plan   1. Supervision of other normal pregnancy, antepartum   2. [redacted] weeks gestation of pregnancy   3. Headache in pregnancy, antepartum, third trimester   4. Anemia affecting pregnancy in third trimester    - Discharge home in stable condition - Rx for Reglan and iron supplement sent - Return precautions given. Return to MAU as needed - Keep appointment as scheduled on Friday 5/17  Brand Males, CNM 05/04/2023, 8:00 PM

## 2023-05-04 NOTE — MAU Note (Signed)
Patient reports headache since Thursday, has taken 1000 mg of tylenol without relief.

## 2023-05-04 NOTE — MAU Note (Signed)
Krystal Murray is a 32 y.o. at [redacted]w[redacted]d here in MAU reporting: head is hurting so bad. Taking 2 ES TYlenol and the muscle relaxors, not working. Sent over from office because of HA>  BP on pt's cuff was high. Having blurring, denies epigastric pain or increase in swelling. Denies leaking or bleeding.  Reports +FM  Onset of complaint: last Thurs Pain score: 10 Vitals:   05/04/23 1706  BP: 129/86  Pulse: 93  Resp: 20  Temp: 98.3 F (36.8 C)  SpO2: 100%     FHT:152 Lab orders placed from triage:   Urine collected. Blood work drawn in triage.  Pt has 4 children with her, asked about someone coming to get them, also will effect what medication we can give for HA, she said "maybe if we are keeping her overnight"

## 2023-05-04 NOTE — Progress Notes (Signed)
Patient presents for BP check. Patient has complaints of getting elevated BP readings at home 130-140/80-95 since 5/11. Also complains of having headaches unrelieved with flexeril or tylenol. Patient was evaluated at [facility]. Also reports feeling dizzy, blurred vision with headache, and decreased fetal movement. Denies epigastric pain. Pre-E labs on 5/11 were normal.  Patient brought in home cuff. Compared reading with home cuff with office cuff. Both readings were normal.  Reactive NST 15x15 Baseline 150  Declined repeat pre-E labs. Declined Fioricet rx. Patient advised patient to go home and eat lunch. Try to rest with the light off. Patient unable to go to MAU at this time due to lack of support person. Patient advised to go be evaluated if headache does not improve. Patient verbalized understanding.

## 2023-05-05 ENCOUNTER — Encounter: Payer: Self-pay | Admitting: Obstetrics

## 2023-05-06 ENCOUNTER — Encounter (HOSPITAL_COMMUNITY): Payer: Self-pay | Admitting: Obstetrics and Gynecology

## 2023-05-06 ENCOUNTER — Inpatient Hospital Stay (HOSPITAL_COMMUNITY): Payer: Medicaid Other

## 2023-05-06 ENCOUNTER — Inpatient Hospital Stay (HOSPITAL_COMMUNITY)
Admission: AD | Admit: 2023-05-06 | Discharge: 2023-05-06 | Disposition: A | Discharge status: Home / Self Care | Payer: Medicaid Other | Attending: Obstetrics and Gynecology | Admitting: Obstetrics and Gynecology

## 2023-05-06 DIAGNOSIS — Z3689 Encounter for other specified antenatal screening: Secondary | ICD-10-CM | POA: Diagnosis not present

## 2023-05-06 DIAGNOSIS — Z3A37 37 weeks gestation of pregnancy: Secondary | ICD-10-CM | POA: Insufficient documentation

## 2023-05-06 DIAGNOSIS — Z3480 Encounter for supervision of other normal pregnancy, unspecified trimester: Secondary | ICD-10-CM

## 2023-05-06 DIAGNOSIS — O99353 Diseases of the nervous system complicating pregnancy, third trimester: Secondary | ICD-10-CM | POA: Diagnosis not present

## 2023-05-06 DIAGNOSIS — G44219 Episodic tension-type headache, not intractable: Secondary | ICD-10-CM | POA: Diagnosis not present

## 2023-05-06 DIAGNOSIS — O99013 Anemia complicating pregnancy, third trimester: Secondary | ICD-10-CM | POA: Insufficient documentation

## 2023-05-06 DIAGNOSIS — R519 Headache, unspecified: Secondary | ICD-10-CM | POA: Diagnosis present

## 2023-05-06 LAB — CBC
HCT: 26.2 % — ABNORMAL LOW (ref 36.0–46.0)
Hemoglobin: 8 g/dL — ABNORMAL LOW (ref 12.0–15.0)
MCH: 25 pg — ABNORMAL LOW (ref 26.0–34.0)
MCHC: 30.5 g/dL (ref 30.0–36.0)
MCV: 81.9 fL (ref 80.0–100.0)
Platelets: 183 10*3/uL (ref 150–400)
RBC: 3.2 MIL/uL — ABNORMAL LOW (ref 3.87–5.11)
RDW: 15.4 % (ref 11.5–15.5)
WBC: 8 10*3/uL (ref 4.0–10.5)
nRBC: 0 % (ref 0.0–0.2)

## 2023-05-06 LAB — URINALYSIS, ROUTINE W REFLEX MICROSCOPIC
Bilirubin Urine: NEGATIVE
Glucose, UA: NEGATIVE mg/dL
Hgb urine dipstick: NEGATIVE
Ketones, ur: NEGATIVE mg/dL
Leukocytes,Ua: NEGATIVE
Nitrite: NEGATIVE
Protein, ur: NEGATIVE mg/dL
Specific Gravity, Urine: 1.008 (ref 1.005–1.030)
pH: 6 (ref 5.0–8.0)

## 2023-05-06 LAB — COMPREHENSIVE METABOLIC PANEL
ALT: 8 U/L (ref 0–44)
AST: 15 U/L (ref 15–41)
Albumin: 2.6 g/dL — ABNORMAL LOW (ref 3.5–5.0)
Alkaline Phosphatase: 77 U/L (ref 38–126)
Anion gap: 10 (ref 5–15)
BUN: 7 mg/dL (ref 6–20)
CO2: 20 mmol/L — ABNORMAL LOW (ref 22–32)
Calcium: 8.6 mg/dL — ABNORMAL LOW (ref 8.9–10.3)
Chloride: 106 mmol/L (ref 98–111)
Creatinine, Ser: 0.65 mg/dL (ref 0.44–1.00)
GFR, Estimated: >60 mL/min (ref >60)
Glucose, Bld: 83 mg/dL (ref 70–99)
Potassium: 3.4 mmol/L — ABNORMAL LOW (ref 3.5–5.1)
Sodium: 136 mmol/L (ref 135–145)
Total Bilirubin: 0.3 mg/dL (ref 0.3–1.2)
Total Protein: 6.4 g/dL — ABNORMAL LOW (ref 6.5–8.1)

## 2023-05-06 LAB — PROTEIN / CREATININE RATIO, URINE
Creatinine, Urine: 68 mg/dL
Protein Creatinine Ratio: 0.16 mg/mg{Cre} — ABNORMAL HIGH (ref 0.00–0.15)
Total Protein, Urine: 11 mg/dL

## 2023-05-06 MED ORDER — SODIUM CHLORIDE 0.9 % IV SOLN: INTRAVENOUS | Status: DC

## 2023-05-06 MED ORDER — DIPHENHYDRAMINE HCL 50 MG/ML IJ SOLN
25.0000 mg | Freq: Once | INTRAMUSCULAR | Status: AC
  Administered 2023-05-06: 25 mg via INTRAVENOUS
  Filled 2023-05-06: qty 1

## 2023-05-06 MED ORDER — METOCLOPRAMIDE HCL 5 MG/ML IJ SOLN
10.0000 mg | Freq: Once | INTRAMUSCULAR | Status: AC
  Administered 2023-05-06: 10 mg via INTRAVENOUS
  Filled 2023-05-06: qty 2

## 2023-05-06 MED ORDER — BUTALBITAL-APAP-CAFFEINE 50-325-40 MG PO TABS: 1.0000 | ORAL_TABLET | Freq: Four times a day (QID) | ORAL | 0 refills | Status: DC | PRN

## 2023-05-06 MED ORDER — METOCLOPRAMIDE HCL 10 MG PO TABS
10.0000 mg | ORAL_TABLET | Freq: Four times a day (QID) | ORAL | Status: DC | PRN
  Filled 2023-05-06: qty 1

## 2023-05-06 MED ORDER — LACTATED RINGERS IV BOLUS
1000.0000 mL | Freq: Once | INTRAVENOUS | Status: AC
  Administered 2023-05-06: 1000 mL via INTRAVENOUS

## 2023-05-06 MED ORDER — SODIUM CHLORIDE 0.9 % IV SOLN
510.0000 mg | Freq: Once | INTRAVENOUS | Status: AC
  Administered 2023-05-06: 510 mg via INTRAVENOUS
  Filled 2023-05-06 (×2): qty 17

## 2023-05-06 MED ORDER — DEXAMETHASONE SODIUM PHOSPHATE 10 MG/ML IJ SOLN
10.0000 mg | Freq: Once | INTRAMUSCULAR | Status: AC
  Administered 2023-05-06: 10 mg via INTRAVENOUS
  Filled 2023-05-06: qty 1

## 2023-05-06 MED ORDER — ACETAMINOPHEN-CAFFEINE 500-65 MG PO TABS
2.0000 | ORAL_TABLET | Freq: Once | ORAL | Status: AC
  Administered 2023-05-06: 2 via ORAL
  Filled 2023-05-06: qty 2

## 2023-05-06 NOTE — MAU Provider Note (Addendum)
History     CSN: [id number]  Arrival date and time: 05/06/23 0909   Event Date/Time   First Provider Initiated Contact with Patient 05/06/23 1018      Chief Complaint  Patient presents with   Headache   32 y.o. [id number] @37 .3 wks presenting with HA. Reports onset 1 week ago. HA is located left periorbital, temporal, and parietal. She has been seen in MAU on 2 other occasions for it and responded well with meds. Rates pain 9/10. Has not taken anything today. Reports occasional blurry vision. Reports occasional SOB and she thinks this may be from possible mold in her home. Denies CP or RUQ pain. Reports +FM.     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    Medical history non-contributory    UTI (lower urinary tract infection)     Past Surgical History:  Procedure Laterality Date   CESAREAN SECTION N/A 02/07/2018   Procedure: CESAREAN SECTION;  Surgeon: Hermina Staggers, MD;  Location: [facility] BIRTHING SUITES;  Service: Obstetrics;  Laterality: N/A;   CESAREAN SECTION  01/28/2020    Family History  Problem Relation Age of Onset   Cancer Maternal Grandmother        breastt   Cancer Maternal Aunt        uterine   Diabetes Maternal Aunt    Hypertension Maternal Aunt    Cancer Cousin        leaukemia    Social History   Tobacco Use   Smoking status: Never    Passive exposure: Never   Smokeless tobacco: Never  Vaping Use   Vaping Use: Never used  Substance Use Topics   Alcohol use: Not Currently    Comment: not since confirmed pregnancy   Drug use: Not Currently    Types: Marijuana    Comment: not since confirmed pregnancy    Allergies: No Known Allergies  Medications Prior to Admission  Medication Sig Dispense Refill  Last Dose   acetaminophen (TYLENOL) 500 MG tablet Take 1,000 mg by mouth every 6 (six) hours as needed for headache.   Past Week   iron polysaccharides (NIFEREX) 150 MG capsule Take 1 capsule (150 mg total) by mouth daily. 30 capsule 1 05/06/2023   Prenatal MV & Min w/FA-DHA (PRENATAL GUMMIES) 0.18-25 MG CHEW Chew 3 tablets by mouth daily. 90 tablet 12 05/06/2023   acetaminophen-codeine (TYLENOL #3) 300-30 MG tablet Take 1 tablet by mouth every 6 (six) hours as needed.      cyclobenzaprine (FLEXERIL) 10 MG tablet Take 1 tablet (10 mg total) by mouth 2 (two) times daily as needed for muscle spasms. 20 tablet 0    metoCLOPramide (REGLAN) 10 MG tablet Take 1 tablet (10 mg total) by mouth every 6 (six) hours. 30 tablet 0    terconazole (TERAZOL 7) 0.4 % vaginal cream Place 1 applicator vaginally at bedtime. 45 g 0     Review of Systems  Eyes:  Positive for visual disturbance.  Respiratory:  Positive for shortness of breath.   Cardiovascular:  Negative for chest pain.  Gastrointestinal:  Negative for abdominal pain.  Genitourinary:  Negative for vaginal bleeding.  Neurological:  Positive for headaches.   Physical Exam   Blood pressure 110/74, pulse 96, temperature 98.1 F (36.7 C), temperature source Oral, resp. rate 17, height 5' 7.01" (1.702 m), weight 105.6 kg, last menstrual period 08/17/2022, SpO2 100 %, currently breastfeeding.  Physical Exam Vitals reviewed.  Constitutional:      General: She is not in acute distress.    Appearance: She is well-developed.  HENT:     Head: Normocephalic and atraumatic.  Eyes:     Extraocular Movements: Extraocular movements intact.     Right eye: Normal extraocular motion and no nystagmus.     Left eye: Normal extraocular motion and no nystagmus.     Pupils: Pupils are equal, round, and reactive to light.  Cardiovascular:     Rate and Rhythm: Normal rate.  Pulmonary:     Effort: Pulmonary effort is normal. No respiratory distress.   Musculoskeletal:        General: Normal range of motion.     Cervical back: Normal range of motion.  Skin:    General: Skin is warm and dry.  Neurological:     Mental Status: She is alert and oriented to person, place, and time.     Cranial Nerves: No cranial nerve deficit or facial asymmetry.     Motor: No weakness.     Coordination: Coordination normal.     Deep Tendon Reflexes: Reflexes normal.  Psychiatric:        Mood and Affect: Mood normal.        Behavior: Behavior normal.   EFM: 135 bpm, mod variability, + accels, no decels Toco: rare  Results for orders placed or performed during the hospital encounter of 05/06/23 (from the past 24 hour(s))  Urinalysis, Routine w reflex microscopic -Urine, Clean Catch     Status: Abnormal   Collection Time: 05/06/23 10:19 AM  Result Value Ref Range   Color, Urine YELLOW YELLOW   APPearance CLOUDY (A) CLEAR   Specific Gravity, Urine 1.008 1.005 - 1.030   pH 6.0 5.0 - 8.0   Glucose, UA NEGATIVE NEGATIVE mg/dL   Hgb urine dipstick NEGATIVE NEGATIVE   Bilirubin Urine NEGATIVE NEGATIVE   Ketones, ur NEGATIVE NEGATIVE mg/dL   Protein, ur NEGATIVE NEGATIVE mg/dL   Nitrite NEGATIVE NEGATIVE   Leukocytes,Ua NEGATIVE NEGATIVE   MR BRAIN WO CONTRAST  Result Date: 05/06/2023 CLINICAL DATA:  Headache, new or worsening (pregnant) EXAM: MRI HEAD WITHOUT CONTRAST TECHNIQUE: Multiplanar, multiecho pulse sequences of the brain and surrounding structures were obtained without intravenous contrast. COMPARISON:  None Available. FINDINGS: Brain: Negative for an acute infarct. No hemorrhage. No hydrocephalus. No extra-axial fluid collection. No mass effect. No mass lesion. Vascular: Normal flow voids. Skull and upper cervical spine: Normal marrow signal. Sinuses/Orbits: No mastoid or middle ear effusion. Paranasal sinuses are clear. Orbits are unremarkable. Other: None. IMPRESSION: No specific etiology for headaches identified. Electronically Signed   By:  Lorenza Cambridge M.D.   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  Labs and MRI ordered. No signs of PEC or acute head process. Anemia noted.  1330: MRI neg, HA improved but not resolved. Pt had previously refused IV Fe  but accepts now. Feraheme ordered and given. Stable for discharge home. Will Rx Fioricet for home use.  Assessment and Plan   1. Supervision of other normal pregnancy, antepartum   2. [redacted] weeks gestation of pregnancy   3. NST (non-stress test) reactive   4. Episodic tension-type headache, not intractable   5. Anemia during pregnancy in third trimester    Discharge home [facility] as scheduled Return precautions Rx Fioricet  Allergies as of 05/06/2023   No Known Allergies      Medication List     STOP taking these medications    acetaminophen-codeine 300-30 MG tablet Commonly known as: TYLENOL #3       TAKE these medications    acetaminophen 500 MG tablet Commonly known as: TYLENOL Take 1,000 mg by mouth every 6 (six) hours as needed for headache.   butalbital-acetaminophen-caffeine 50-325-40 MG tablet Commonly known as: FIORICET Take 1-2 tablets by mouth every 6 (six) hours as needed for headache.   cyclobenzaprine 10 MG tablet Commonly known as: FLEXERIL Take 1 tablet (10 mg total) by mouth 2 (two) times daily as needed for muscle spasms.   iron polysaccharides 150 MG capsule Commonly known as: NIFEREX Take 1 capsule (150 mg total) by mouth daily.   metoCLOPramide 10 MG tablet Commonly known as: REGLAN Take 1 tablet (10 mg total) by mouth every 6 (six) hours.   Prenatal Gummies 0.18-25 MG Chew Chew 3 tablets by mouth daily.   terconazole 0.4 % vaginal cream Commonly known as: TERAZOL 7 Place 1 applicator vaginally at bedtime.        Donette Larry, CNM 05/06/2023, 10:35  AM

## 2023-05-06 NOTE — MAU Note (Addendum)
..  Krystal Murray is a 32 y.o. at [redacted]w[redacted]d here in MAU reporting: report of a headache,seeing spots, and blurred vision.that increases with activity and accompanied by SOB. Also having lower and middle back pain intermittent when sitting down. Pt denies any swelling or epigastric pain. Pt reports smelling some mold in her house that causes the SOB. Onset on Thursday, took Tylenol 05/04/2023 and benadryl 05/01/2023. + fetal movement. No VB Pain score: 9 Vitals:   05/06/23 0941 05/06/23 0945  BP: 126/75   Pulse: 100   Resp: 17   Temp: 98.1 F (36.7 C)   SpO2:  98%     FHT:135

## 2023-05-07 ENCOUNTER — Encounter: Payer: Medicaid Other | Admitting: Obstetrics

## 2023-05-10 ENCOUNTER — Encounter (HOSPITAL_COMMUNITY): Payer: Self-pay

## 2023-05-10 ENCOUNTER — Encounter (HOSPITAL_COMMUNITY): Payer: Self-pay | Admitting: Family Medicine

## 2023-05-10 NOTE — Patient Instructions (Signed)
Krystal Murray  05/10/2023   Your procedure is scheduled on:  05/18/2023  Arrive at 0745 at Entrance C on [address] at [facility]  and [employer]. You are invited to use the FREE valet parking or use the Visitor's parking deck.  Pick up the phone at the desk and dial [phone].  Call this number if you have problems the morning of surgery: [phone]  Remember:   Do not eat food:(After Midnight) Desps de medianoche.  Do not drink clear liquids: (After Midnight) Desps de medianoche.  Take these medicines the morning of surgery with A SIP OF WATER:  none   Do not wear jewelry, make-up or nail polish.  Do not wear lotions, powders, or perfumes. Do not wear deodorant.  Do not shave 48 hours prior to surgery.  Do not bring valuables to the hospital.  [facility] is not   responsible for any belongings or valuables brought to the hospital.  Contacts, dentures or bridgework may not be worn into surgery.  Leave suitcase in the car. After surgery it may be brought to your room.  For patients admitted to the hospital, checkout time is 11:00 AM the day of              discharge.      Please read over the following fact sheets that you were given:     Preparing for Surgery

## 2023-05-11 ENCOUNTER — Other Ambulatory Visit: Payer: Self-pay | Admitting: Family Medicine

## 2023-05-11 DIAGNOSIS — Z98891 History of uterine scar from previous surgery: Secondary | ICD-10-CM

## 2023-05-11 DIAGNOSIS — Z3A37 37 weeks gestation of pregnancy: Secondary | ICD-10-CM

## 2023-05-13 ENCOUNTER — Encounter: Payer: Medicaid Other | Admitting: Obstetrics & Gynecology

## 2023-05-14 ENCOUNTER — Encounter: Payer: Medicaid Other | Admitting: Obstetrics and Gynecology

## 2023-05-16 ENCOUNTER — Ambulatory Visit (HOSPITAL_COMMUNITY)
Admission: RE | Admit: 2023-05-16 | Discharge: 2023-05-16 | Disposition: A | Discharge status: Home / Self Care | Payer: Medicaid Other | Source: Ambulatory Visit | Attending: Family Medicine | Admitting: Family Medicine

## 2023-05-16 DIAGNOSIS — Z01812 Encounter for preprocedural laboratory examination: Secondary | ICD-10-CM | POA: Insufficient documentation

## 2023-05-16 DIAGNOSIS — Z3A37 37 weeks gestation of pregnancy: Secondary | ICD-10-CM | POA: Diagnosis not present

## 2023-05-16 LAB — CBC
HCT: 27.3 % — ABNORMAL LOW (ref 36.0–46.0)
Hemoglobin: 8.4 g/dL — ABNORMAL LOW (ref 12.0–15.0)
MCH: 25.6 pg — ABNORMAL LOW (ref 26.0–34.0)
MCHC: 30.8 g/dL (ref 30.0–36.0)
MCV: 83.2 fL (ref 80.0–100.0)
Platelets: 168 10*3/uL (ref 150–400)
RBC: 3.28 MIL/uL — ABNORMAL LOW (ref 3.87–5.11)
RDW: 18.9 % — ABNORMAL HIGH (ref 11.5–15.5)
WBC: 9.4 10*3/uL (ref 4.0–10.5)
nRBC: 0 % (ref 0.0–0.2)

## 2023-05-16 LAB — RPR: RPR Ser Ql: NONREACTIVE

## 2023-05-16 LAB — TYPE AND SCREEN
ABO/RH(D): A NEG
Antibody Screen: POSITIVE

## 2023-05-16 NOTE — MAU Note (Signed)
Pt reports to mau for pre op labs for section on 5/28.  Denies any complaints.

## 2023-05-18 ENCOUNTER — Encounter (HOSPITAL_COMMUNITY)
Admission: AD | Disposition: A | Discharge status: Home / Self Care | Payer: Self-pay | Source: Home / Self Care | Attending: Family Medicine

## 2023-05-18 ENCOUNTER — Inpatient Hospital Stay (HOSPITAL_COMMUNITY): Payer: Medicaid Other | Admitting: Anesthesiology

## 2023-05-18 ENCOUNTER — Inpatient Hospital Stay (HOSPITAL_COMMUNITY)
Admission: AD | Admit: 2023-05-18 | Discharge: 2023-05-21 | DRG: 787 | Disposition: A | Discharge status: Home / Self Care | Payer: Medicaid Other | Attending: Family Medicine | Admitting: Family Medicine

## 2023-05-18 ENCOUNTER — Encounter (HOSPITAL_COMMUNITY): Payer: Self-pay | Admitting: Family Medicine

## 2023-05-18 ENCOUNTER — Other Ambulatory Visit: Payer: Self-pay

## 2023-05-18 DIAGNOSIS — D62 Acute posthemorrhagic anemia: Secondary | ICD-10-CM | POA: Diagnosis not present

## 2023-05-18 DIAGNOSIS — O9902 Anemia complicating childbirth: Secondary | ICD-10-CM

## 2023-05-18 DIAGNOSIS — Z148 Genetic carrier of other disease: Secondary | ICD-10-CM | POA: Diagnosis not present

## 2023-05-18 DIAGNOSIS — O34211 Maternal care for low transverse scar from previous cesarean delivery: Secondary | ICD-10-CM | POA: Diagnosis present

## 2023-05-18 DIAGNOSIS — O165 Unspecified maternal hypertension, complicating the puerperium: Secondary | ICD-10-CM | POA: Diagnosis present

## 2023-05-18 DIAGNOSIS — O26899 Other specified pregnancy related conditions, unspecified trimester: Secondary | ICD-10-CM

## 2023-05-18 DIAGNOSIS — B009 Herpesviral infection, unspecified: Secondary | ICD-10-CM | POA: Diagnosis present

## 2023-05-18 DIAGNOSIS — Z23 Encounter for immunization: Secondary | ICD-10-CM | POA: Diagnosis not present

## 2023-05-18 DIAGNOSIS — Z6791 Unspecified blood type, Rh negative: Secondary | ICD-10-CM

## 2023-05-18 DIAGNOSIS — D649 Anemia, unspecified: Secondary | ICD-10-CM | POA: Diagnosis not present

## 2023-05-18 DIAGNOSIS — Z3A39 39 weeks gestation of pregnancy: Secondary | ICD-10-CM | POA: Diagnosis not present

## 2023-05-18 DIAGNOSIS — O135 Gestational [pregnancy-induced] hypertension without significant proteinuria, complicating the puerperium: Secondary | ICD-10-CM | POA: Diagnosis not present

## 2023-05-18 DIAGNOSIS — O9832 Other infections with a predominantly sexual mode of transmission complicating childbirth: Secondary | ICD-10-CM | POA: Diagnosis not present

## 2023-05-18 DIAGNOSIS — O26893 Other specified pregnancy related conditions, third trimester: Secondary | ICD-10-CM | POA: Diagnosis present

## 2023-05-18 DIAGNOSIS — A6000 Herpesviral infection of urogenital system, unspecified: Secondary | ICD-10-CM | POA: Diagnosis present

## 2023-05-18 DIAGNOSIS — O99019 Anemia complicating pregnancy, unspecified trimester: Secondary | ICD-10-CM | POA: Diagnosis present

## 2023-05-18 DIAGNOSIS — Z98891 History of uterine scar from previous surgery: Secondary | ICD-10-CM

## 2023-05-18 DIAGNOSIS — D563 Thalassemia minor: Secondary | ICD-10-CM | POA: Diagnosis present

## 2023-05-18 DIAGNOSIS — Z3480 Encounter for supervision of other normal pregnancy, unspecified trimester: Secondary | ICD-10-CM

## 2023-05-18 SURGERY — Surgical Case
Anesthesia: Spinal | Site: Abdomen

## 2023-05-18 MED ORDER — POVIDONE-IODINE 10 % EX SWAB
2.0000 | Freq: Once | CUTANEOUS | Status: AC
  Administered 2023-05-18: 2 via TOPICAL

## 2023-05-18 MED ORDER — PROMETHAZINE HCL 25 MG/ML IJ SOLN: 6.2500 mg | INTRAMUSCULAR | Status: DC | PRN

## 2023-05-18 MED ORDER — STERILE WATER FOR IRRIGATION IR SOLN
Status: DC | PRN
  Administered 2023-05-18: 1

## 2023-05-18 MED ORDER — MEPERIDINE HCL 25 MG/ML IJ SOLN: 6.2500 mg | INTRAMUSCULAR | Status: DC | PRN

## 2023-05-18 MED ORDER — OXYCODONE HCL 5 MG PO TABS
5.0000 mg | ORAL_TABLET | ORAL | Status: DC | PRN
  Administered 2023-05-18: 5 mg via ORAL
  Administered 2023-05-19 – 2023-05-21 (×9): 10 mg via ORAL
  Filled 2023-05-18 (×3): qty 2
  Filled 2023-05-18: qty 1
  Filled 2023-05-18 (×2): qty 2
  Filled 2023-05-18: qty 1
  Filled 2023-05-18 (×4): qty 2
  Filled 2023-05-18: qty 1

## 2023-05-18 MED ORDER — SCOPOLAMINE 1 MG/3DAYS TD PT72
1.0000 | MEDICATED_PATCH | Freq: Once | TRANSDERMAL | Status: AC
  Administered 2023-05-18: 1.5 mg via TRANSDERMAL
  Filled 2023-05-18: qty 1

## 2023-05-18 MED ORDER — IBUPROFEN 600 MG PO TABS
600.0000 mg | ORAL_TABLET | Freq: Four times a day (QID) | ORAL | Status: DC
  Administered 2023-05-19 – 2023-05-21 (×8): 600 mg via ORAL
  Filled 2023-05-18 (×9): qty 1

## 2023-05-18 MED ORDER — FENTANYL CITRATE (PF) 100 MCG/2ML IJ SOLN
INTRAMUSCULAR | Status: DC | PRN
  Administered 2023-05-18: 15 ug via INTRATHECAL

## 2023-05-18 MED ORDER — DIPHENHYDRAMINE HCL 25 MG PO CAPS: 25.0000 mg | ORAL_CAPSULE | Freq: Four times a day (QID) | ORAL | Status: DC | PRN

## 2023-05-18 MED ORDER — SIMETHICONE 80 MG PO CHEW
80.0000 mg | CHEWABLE_TABLET | Freq: Three times a day (TID) | ORAL | Status: DC
  Administered 2023-05-18 – 2023-05-21 (×9): 80 mg via ORAL
  Filled 2023-05-18 (×9): qty 1

## 2023-05-18 MED ORDER — DEXAMETHASONE SODIUM PHOSPHATE 10 MG/ML IJ SOLN
INTRAMUSCULAR | Status: DC | PRN
  Administered 2023-05-18: 10 mg via INTRAVENOUS

## 2023-05-18 MED ORDER — KETOROLAC TROMETHAMINE 30 MG/ML IJ SOLN
30.0000 mg | Freq: Four times a day (QID) | INTRAMUSCULAR | Status: AC
  Administered 2023-05-18 – 2023-05-19 (×4): 30 mg via INTRAVENOUS
  Filled 2023-05-18 (×4): qty 1

## 2023-05-18 MED ORDER — KETOROLAC TROMETHAMINE 30 MG/ML IJ SOLN
30.0000 mg | Freq: Once | INTRAMUSCULAR | Status: AC | PRN
  Administered 2023-05-18: 30 mg via INTRAVENOUS

## 2023-05-18 MED ORDER — DEXAMETHASONE SODIUM PHOSPHATE 10 MG/ML IJ SOLN
INTRAMUSCULAR | Status: AC
  Filled 2023-05-18: qty 1

## 2023-05-18 MED ORDER — FENTANYL CITRATE (PF) 100 MCG/2ML IJ SOLN
INTRAMUSCULAR | Status: AC
  Filled 2023-05-18: qty 2

## 2023-05-18 MED ORDER — NALOXONE HCL 4 MG/10ML IJ SOLN: 1.0000 ug/kg/h | INTRAVENOUS | Status: DC | PRN

## 2023-05-18 MED ORDER — TRANEXAMIC ACID-NACL 1000-0.7 MG/100ML-% IV SOLN
INTRAVENOUS | Status: DC | PRN
  Administered 2023-05-18: 1000 mg via INTRAVENOUS

## 2023-05-18 MED ORDER — MORPHINE SULFATE (PF) 0.5 MG/ML IJ SOLN
INTRAMUSCULAR | Status: AC
  Filled 2023-05-18: qty 10

## 2023-05-18 MED ORDER — FENTANYL CITRATE (PF) 100 MCG/2ML IJ SOLN
25.0000 ug | INTRAMUSCULAR | Status: DC | PRN
  Administered 2023-05-18: 50 ug via INTRAVENOUS

## 2023-05-18 MED ORDER — OXYTOCIN-SODIUM CHLORIDE 30-0.9 UT/500ML-% IV SOLN
INTRAVENOUS | Status: DC | PRN
  Administered 2023-05-18: 200 mL via INTRAVENOUS

## 2023-05-18 MED ORDER — CEFAZOLIN SODIUM-DEXTROSE 2-4 GM/100ML-% IV SOLN
2.0000 g | INTRAVENOUS | Status: AC
  Administered 2023-05-18: 2 g via INTRAVENOUS

## 2023-05-18 MED ORDER — BUPIVACAINE IN DEXTROSE 0.75-8.25 % IT SOLN
INTRATHECAL | Status: DC | PRN
  Administered 2023-05-18: 1.6 mL via INTRATHECAL

## 2023-05-18 MED ORDER — DIPHENHYDRAMINE HCL 50 MG/ML IJ SOLN: 12.5000 mg | Freq: Four times a day (QID) | INTRAMUSCULAR | Status: DC | PRN

## 2023-05-18 MED ORDER — MORPHINE SULFATE (PF) 0.5 MG/ML IJ SOLN
INTRAMUSCULAR | Status: DC | PRN
  Administered 2023-05-18: 150 ug via INTRATHECAL

## 2023-05-18 MED ORDER — DIBUCAINE (PERIANAL) 1 % EX OINT: 1.0000 | TOPICAL_OINTMENT | CUTANEOUS | Status: DC | PRN

## 2023-05-18 MED ORDER — OXYCODONE HCL 5 MG PO TABS
ORAL_TABLET | ORAL | Status: AC
  Filled 2023-05-18: qty 1

## 2023-05-18 MED ORDER — ENOXAPARIN SODIUM 60 MG/0.6ML IJ SOSY
50.0000 mg | PREFILLED_SYRINGE | INTRAMUSCULAR | Status: DC
  Administered 2023-05-19 – 2023-05-21 (×3): 50 mg via SUBCUTANEOUS
  Filled 2023-05-18 (×3): qty 0.6

## 2023-05-18 MED ORDER — ACETAMINOPHEN 500 MG PO TABS
1000.0000 mg | ORAL_TABLET | Freq: Four times a day (QID) | ORAL | Status: DC
  Administered 2023-05-18 – 2023-05-21 (×10): 1000 mg via ORAL
  Filled 2023-05-18 (×11): qty 2

## 2023-05-18 MED ORDER — COCONUT OIL OIL: 1.0000 | TOPICAL_OIL | Status: DC | PRN

## 2023-05-18 MED ORDER — WITCH HAZEL-GLYCERIN EX PADS: 1.0000 | MEDICATED_PAD | CUTANEOUS | Status: DC | PRN

## 2023-05-18 MED ORDER — KETOROLAC TROMETHAMINE 30 MG/ML IJ SOLN
INTRAMUSCULAR | Status: AC
  Filled 2023-05-18: qty 1

## 2023-05-18 MED ORDER — NALOXONE HCL 0.4 MG/ML IJ SOLN: 0.4000 mg | INTRAMUSCULAR | Status: DC | PRN

## 2023-05-18 MED ORDER — OXYTOCIN-SODIUM CHLORIDE 30-0.9 UT/500ML-% IV SOLN: 2.5000 [IU]/h | INTRAVENOUS | Status: AC

## 2023-05-18 MED ORDER — CEFAZOLIN SODIUM-DEXTROSE 2-4 GM/100ML-% IV SOLN
INTRAVENOUS | Status: AC
  Filled 2023-05-18: qty 100

## 2023-05-18 MED ORDER — PHENYLEPHRINE HCL-NACL 20-0.9 MG/250ML-% IV SOLN
INTRAVENOUS | Status: DC | PRN
  Administered 2023-05-18: 30 ug/min via INTRAVENOUS

## 2023-05-18 MED ORDER — SODIUM CHLORIDE 0.9% FLUSH: 3.0000 mL | INTRAVENOUS | Status: DC | PRN

## 2023-05-18 MED ORDER — PRENATAL MULTIVITAMIN CH
1.0000 | ORAL_TABLET | Freq: Every day | ORAL | Status: DC
  Filled 2023-05-18: qty 1

## 2023-05-18 MED ORDER — AMISULPRIDE (ANTIEMETIC) 5 MG/2ML IV SOLN: 10.0000 mg | Freq: Once | INTRAVENOUS | Status: DC | PRN

## 2023-05-18 MED ORDER — OXYCODONE HCL 5 MG PO TABS
5.0000 mg | ORAL_TABLET | Freq: Once | ORAL | Status: AC | PRN
  Administered 2023-05-18: 5 mg via ORAL

## 2023-05-18 MED ORDER — SIMETHICONE 80 MG PO CHEW: 80.0000 mg | CHEWABLE_TABLET | ORAL | Status: DC | PRN

## 2023-05-18 MED ORDER — ACETAMINOPHEN 10 MG/ML IV SOLN
INTRAVENOUS | Status: DC | PRN
  Administered 2023-05-18: 1000 mg via INTRAVENOUS

## 2023-05-18 MED ORDER — SENNOSIDES-DOCUSATE SODIUM 8.6-50 MG PO TABS
2.0000 | ORAL_TABLET | Freq: Every day | ORAL | Status: DC
  Administered 2023-05-19 – 2023-05-21 (×3): 2 via ORAL
  Filled 2023-05-18 (×3): qty 2

## 2023-05-18 MED ORDER — ONDANSETRON HCL 4 MG/2ML IJ SOLN
INTRAMUSCULAR | Status: AC
  Filled 2023-05-18: qty 2

## 2023-05-18 MED ORDER — SODIUM CHLORIDE 0.9 % IR SOLN
Status: DC | PRN
  Administered 2023-05-18: 1

## 2023-05-18 MED ORDER — ONDANSETRON HCL 4 MG/2ML IJ SOLN
INTRAMUSCULAR | Status: DC | PRN
  Administered 2023-05-18: 4 mg via INTRAVENOUS

## 2023-05-18 MED ORDER — ONDANSETRON HCL 4 MG/2ML IJ SOLN: 4.0000 mg | Freq: Three times a day (TID) | INTRAMUSCULAR | Status: DC | PRN

## 2023-05-18 MED ORDER — ZOLPIDEM TARTRATE 5 MG PO TABS: 5.0000 mg | ORAL_TABLET | Freq: Every evening | ORAL | Status: DC | PRN

## 2023-05-18 MED ORDER — SODIUM CHLORIDE 0.9 % IV SOLN
500.0000 mg | Freq: Once | INTRAVENOUS | Status: AC
  Administered 2023-05-18: 500 mg via INTRAVENOUS
  Filled 2023-05-18: qty 25

## 2023-05-18 MED ORDER — MENTHOL 3 MG MT LOZG: 1.0000 | LOZENGE | OROMUCOSAL | Status: DC | PRN

## 2023-05-18 MED ORDER — OXYCODONE HCL 5 MG/5ML PO SOLN: 5.0000 mg | Freq: Once | ORAL | Status: AC | PRN

## 2023-05-18 MED ORDER — LACTATED RINGERS IV SOLN: INTRAVENOUS | Status: DC

## 2023-05-18 MED ORDER — COMPLETENATE 29-1 MG PO CHEW
1.0000 | CHEWABLE_TABLET | Freq: Every day | ORAL | Status: DC
  Administered 2023-05-18 – 2023-05-21 (×4): 1 via ORAL
  Filled 2023-05-18 (×4): qty 1

## 2023-05-18 SURGICAL SUPPLY — 33 items
ADH SKN CLS APL DERMABOND .7 (GAUZE/BANDAGES/DRESSINGS) ×2
APL PRP STRL LF DISP 70% ISPRP (MISCELLANEOUS) ×2
CHLORAPREP W/TINT 26 (MISCELLANEOUS) ×2 IMPLANT
CLAMP UMBILICAL CORD (MISCELLANEOUS) ×1 IMPLANT
CLOTH BEACON ORANGE TIMEOUT ST (SAFETY) ×1 IMPLANT
DERMABOND ADVANCED .7 DNX12 (GAUZE/BANDAGES/DRESSINGS) ×2 IMPLANT
DRSG OPSITE POSTOP 4X10 (GAUZE/BANDAGES/DRESSINGS) ×1 IMPLANT
ELECT REM PT RETURN 9FT ADLT (ELECTROSURGICAL) ×1
ELECTRODE REM PT RTRN 9FT ADLT (ELECTROSURGICAL) ×1 IMPLANT
EXTRACTOR VACUUM M CUP 4 TUBE (SUCTIONS) IMPLANT
GAUZE PAD ABD 7.5X8 STRL (GAUZE/BANDAGES/DRESSINGS) IMPLANT
GAUZE SPONGE 4X4 12PLY STRL LF (GAUZE/BANDAGES/DRESSINGS) IMPLANT
GLOVE BIOGEL PI IND STRL 7.0 (GLOVE) ×2 IMPLANT
GLOVE BIOGEL PI IND STRL 7.5 (GLOVE) ×2 IMPLANT
GLOVE ECLIPSE 7.5 STRL STRAW (GLOVE) ×1 IMPLANT
GOWN STRL REUS W/TWL LRG LVL3 (GOWN DISPOSABLE) ×3 IMPLANT
KIT ABG SYR 3ML LUER SLIP (SYRINGE) IMPLANT
NDL HYPO 25X5/8 SAFETYGLIDE (NEEDLE) IMPLANT
NEEDLE HYPO 25X5/8 SAFETYGLIDE (NEEDLE) IMPLANT
NS IRRIG 1000ML POUR BTL (IV SOLUTION) ×1 IMPLANT
PACK C SECTION WH (CUSTOM PROCEDURE TRAY) ×1 IMPLANT
PAD OB MATERNITY 4.3X12.25 (PERSONAL CARE ITEMS) ×1 IMPLANT
RTRCTR C-SECT PINK 25CM LRG (MISCELLANEOUS) ×1 IMPLANT
SUT MNCRL 0 VIOLET CTX 36 (SUTURE) ×2 IMPLANT
SUT PLAIN 0 NONE (SUTURE) IMPLANT
SUT VIC AB 0 CTX 36 (SUTURE) ×1
SUT VIC AB 0 CTX36XBRD ANBCTRL (SUTURE) ×1 IMPLANT
SUT VIC AB 2-0 CT1 27 (SUTURE) ×1
SUT VIC AB 2-0 CT1 TAPERPNT 27 (SUTURE) ×1 IMPLANT
SUT VIC AB 4-0 KS 27 (SUTURE) ×1 IMPLANT
TOWEL OR 17X24 6PK STRL BLUE (TOWEL DISPOSABLE) ×1 IMPLANT
TRAY FOLEY W/BAG SLVR 14FR LF (SET/KITS/TRAYS/PACK) ×1 IMPLANT
WATER STERILE IRR 1000ML POUR (IV SOLUTION) ×1 IMPLANT

## 2023-05-18 NOTE — Transfer of Care (Signed)
Immediate Anesthesia Transfer of Care Note  Patient: Krystal Murray  Procedure(s) Performed: CESAREAN SECTION (Abdomen)  Patient Location: PACU  Anesthesia Type:Spinal  Level of Consciousness: awake, alert , and oriented  Airway & Oxygen Therapy: Patient Spontanous Breathing  Post-op Assessment: Report given to RN and Post -op Vital signs reviewed and stable  Post vital signs: Reviewed and stable  Last Vitals:  Vitals Value Taken Time  BP 111/78 05/18/23 1052  Temp    Pulse 69 05/18/23 1056  Resp 19 05/18/23 1056  SpO2 95 % 05/18/23 1056  Vitals shown include unvalidated device data.  Last Pain:  Vitals:   05/18/23 0811  TempSrc: Oral  PainSc: 0-No pain         Complications: No notable events documented.

## 2023-05-18 NOTE — Discharge Summary (Addendum)
Postpartum Discharge Summary  Date of Service updated 05/21/2023     Patient Name: Krystal Murray DOB: 14-Aug-1991 MRN: [id number]  Date of admission: 05/18/2023 Delivery date:05/18/2023  Delivering provider: Levie Heritage  Date of discharge: 05/21/2023  Admitting diagnosis: Cesarean delivery delivered [O82] Intrauterine pregnancy: [redacted]w[redacted]d     Secondary diagnosis:  Principal Problem:   Cesarean delivery delivered Active Problems:   HSV-2 (herpes simplex virus 2) infection   Status post C-section   Supervision of other normal pregnancy, antepartum   Alpha thalassemia silent carrier   Rh negative status during pregnancy   Anemia affecting pregnancy  Additional problems: PPHTN   Discharge diagnosis: Term Pregnancy Delivered and Anemia                                              Post partum procedures: None Augmentation: N/A Complications: Hemorrhage>1043mL  Hospital course: Sceduled C/S   32 y.o. yo [id number] at [redacted]w[redacted]d was admitted to the hospital 05/18/2023 for scheduled cesarean section with the following indication:Elective Repeat.Delivery details are as follows:  Membrane Rupture Time/Date: 9:59 AM ,05/18/2023   Delivery Method:C-Section, Low Transverse  Details of operation can be found in separate operative note.  Patient had a postpartum course complicated by PPHTN.  She is ambulating, tolerating a regular diet, passing flatus, and urinating well. Patient is discharged home in stable condition on  05/21/23        Newborn Data: Birth date:05/18/2023  Birth time:9:59 AM  Gender:Female  Living status:Living  Apgars:9 ,9  Weight:3440 g     Magnesium Sulfate received: No BMZ received: No Rhophylac:Yes MMR:N/A T-DaP:Given postpartum Flu: N/A Transfusion:No  Physical exam  Vitals:   05/20/23 1609 05/20/23 2041 05/21/23 0046 05/21/23 0614  BP: 138/85 (!) 143/98 117/73 121/70  Pulse:  75  70  Resp:  18  16  Temp:  97.7 F (36.5 C)  98.2 F (36.8 C)  TempSrc:   Oral  Oral  SpO2:    99%  Weight:      Height:       General: alert, cooperative, and no distress Lochia: appropriate Uterine Fundus: firm Incision: Healing well with no significant drainage, No significant erythema, Dressing is clean, dry, and intact DVT Evaluation: No evidence of DVT seen on physical exam. No significant calf/ankle edema. Labs: Lab Results  Component Value Date   WBC 15.6 (H) 05/19/2023   HGB 7.5 (L) 05/19/2023   HCT 24.8 (L) 05/19/2023   MCV 84.4 05/19/2023   PLT 167 05/19/2023      Latest Ref Rng & Units 05/06/2023   10:45 AM  CMP  Glucose 70 - 99 mg/dL 83   BUN 6 - 20 mg/dL 7   Creatinine 1.47 - 8.29 mg/dL 5.62   Sodium 130 - 865 mmol/L 136   Potassium 3.5 - 5.1 mmol/L 3.4   Chloride 98 - 111 mmol/L 106   CO2 22 - 32 mmol/L 20   Calcium 8.9 - 10.3 mg/dL 8.6   Total Protein 6.5 - 8.1 g/dL 6.4   Total Bilirubin 0.3 - 1.2 mg/dL 0.3   Alkaline Phos 38 - 126 U/L 77   AST 15 - 41 U/L 15   ALT 0 - 44 U/L 8    Edinburgh Score:    05/18/2023    2:54 PM  Edinburgh Postnatal Depression Scale Screening Tool  I have been able to laugh and see the funny side of things. 0  I have looked forward with enjoyment to things. 0  I have blamed myself unnecessarily when things went wrong. 1  I have been anxious or worried for no good reason. 0  I have felt scared or panicky for no good reason. 0  Things have been getting on top of me. 1  I have been so unhappy that I have had difficulty sleeping. 0  I have felt sad or miserable. 1  I have been so unhappy that I have been crying. 0  The thought of harming myself has occurred to me. 0  Edinburgh Postnatal Depression Scale Total 3     After visit meds:  Allergies as of 05/21/2023   No Known Allergies      Medication List     STOP taking these medications    acetaminophen 500 MG tablet Commonly known as: TYLENOL   butalbital-acetaminophen-caffeine 50-325-40 MG tablet Commonly known as: FIORICET    cyclobenzaprine 10 MG tablet Commonly known as: FLEXERIL   iron polysaccharides 150 MG capsule Commonly known as: NIFEREX   metoCLOPramide 10 MG tablet Commonly known as: REGLAN   terconazole 0.4 % vaginal cream Commonly known as: TERAZOL 7       TAKE these medications    ferrous sulfate 325 (65 FE) MG tablet Take 1 tablet (325 mg total) by mouth daily with breakfast.   furosemide 20 MG tablet Commonly known as: LASIX Take 1 tablet (20 mg total) by mouth daily. Start taking on: May 22, 2023   gabapentin 300 MG capsule Commonly known as: NEURONTIN Take 1 capsule (300 mg total) by mouth 3 (three) times daily.   ibuprofen 600 MG tablet Commonly known as: ADVIL Take 1 tablet (600 mg total) by mouth every 6 (six) hours.   NIFEdipine 30 MG 24 hr tablet Commonly known as: ADALAT CC Take 1 tablet (30 mg total) by mouth daily. Start taking on: May 22, 2023   oxyCODONE-acetaminophen 7.5-325 MG tablet Commonly known as: Percocet Take 1 tablet by mouth every 6 (six) hours as needed for up to 3 days for severe pain.   Prenatal Gummies 0.18-25 MG Chew Chew 3 tablets by mouth daily. What changed:  how much to take when to take this         Discharge home in stable condition Infant Feeding: Bottle and Breast Infant Disposition:home with mother Discharge instruction: per After Visit Summary and Postpartum booklet. Activity: Advance as tolerated. Pelvic rest for 6 weeks.  Diet: routine diet Future Appointments: Future Appointments  Date Time Provider Department Center  05/25/2023 10:40 AM [facility] NURSE [facility] None  07/01/2023  2:10 PM Warden Fillers, MD [facility] None   Follow up Visit: Message sent   Please schedule this patient for a In person postpartum visit in 6 weeks with the following provider: Any provider. Additional Postpartum F/U:Incision check 1 week  Low risk pregnancy complicated by:  NA Delivery mode:  C-Section, Low Transverse  Anticipated Birth  Control:   Declines   05/21/2023 Tiffany Kocher, DO  Fellow Attestation  I saw and evaluated the patient, performing the key elements of the service.I  personally performed or re-performed the history, physical exam, and medical decision making activities of this service and have verified that the service and findings are accurately documented in the resident's note. I developed the management plan that is described in the resident's note, and I agree with the content, with  my edits above.    Derrel Nip, MD OB Fellow

## 2023-05-18 NOTE — Anesthesia Procedure Notes (Signed)
Spinal  Patient location during procedure: OR Start time: 05/18/2023 9:30 AM End time: 05/18/2023 9:35 AM Reason for block: surgical anesthesia Staffing Performed: anesthesiologist  Anesthesiologist: Leonides Grills, MD Performed by: Leonides Grills, MD Authorized by: Leonides Grills, MD   Preanesthetic Checklist Completed: patient identified, IV checked, risks and benefits discussed, surgical consent, monitors and equipment checked, pre-op evaluation and timeout performed Spinal Block Patient position: sitting Prep: DuraPrep Patient monitoring: cardiac monitor, continuous pulse ox and blood pressure Approach: midline Location: L4-5 Injection technique: single-shot Needle Needle type: Pencan  Needle gauge: 24 G Needle length: 9 cm Assessment Sensory level: T10 Events: CSF return Additional Notes Functioning IV was confirmed and monitors were applied. Sterile prep and drape, including hand hygiene and sterile gloves were used. The patient was positioned and the spine was prepped. The skin was anesthetized with lidocaine.  Free flow of clear CSF was obtained prior to injecting local anesthetic into the CSF.  The spinal needle aspirated freely following injection.  The needle was carefully withdrawn.  The patient tolerated the procedure well.

## 2023-05-18 NOTE — Op Note (Signed)
Operative Note   Patient: Krystal Murray  Date of Procedure: 05/18/2023  Procedure: Repeat Low Transverse Cesarean   Indications: previous uterine incision: low transverse  Pre-operative Diagnosis: Repeat Cesarean Section.   Post-operative Diagnosis: Same  TOLAC Candidate: No  Surgeon: Surgeon(s) and Role:    * Stinson, Rhona Raider, DO - Primary    * Percy Comp, Cyndi Lennert, MD - Assisting  Assistants: none  An experienced assistant was required given the standard of surgical care given the complexity of the case.  This assistant was needed for exposure, dissection, suctioning, retraction, instrument exchange, assisting with delivery with administration of fundal pressure, and for overall help during the procedure.   Anesthesia: spinal  Anesthesiologist: Leonides Grills, MD   Antibiotics: Cefazolin   Estimated Blood Loss: 1068 ml   Total IV Fluids: 1000 ml  Urine Output:  100 cc OF clear urine  Specimens: placenta    Complications: no complications   Indications: Nilza TALMADGE Murray is a 32 y.o. [id number] with an IUP [redacted]w[redacted]d presenting for scheduled cesarean secondary to the indications listed above.  The risks of cesarean section discussed with the patient included but were not limited to: bleeding which may require transfusion or reoperation; infection which may require antibiotics; injury to bowel, bladder, ureters or other surrounding organs; injury to the fetus; need for additional procedures including hysterectomy in the event of a life-threatening hemorrhage; placental abnormalities with subsequent pregnancies, incisional problems, thromboembolic phenomenon and other postoperative/anesthesia complications. The patient concurred with the proposed plan, giving informed written consent for the procedure. Patient has been NPO since last night she will remain NPO for procedure. Anesthesia and OR aware. Preoperative prophylactic antibiotics and SCDs ordered on call to the OR.     Findings: Viable infant in cephalic presentation, nuchal x2. Apgars 9 , 9 , . Weight 3440 g . Clear amniotic fluid. Normal placenta, three vessel cord.  Extremely thin lower uterine segment of  uterus, Normal bilateral fallopian tubes, Normal bilateral ovaries. No adhesive disease was encountered.  Procedure Details: A Time Out was held and the above information confirmed. The patient received intravenous antibiotics and had sequential compression devices applied to her lower extremities preoperatively. The patient was taken back to the operative suite where spinal anesthesia was administered. After induction of anesthesia, the patient was draped and prepped in the usual sterile manner and placed in a dorsal supine position with a leftward tilt. A low transverse skin incision was made with scalpel and carried down through the subcutaneous tissue to the fascia. Fascial incision was made and extended transversely. The fascia was separated from the underlying rectus tissue superiorly and inferiorly. The rectus muscles were separated in the midlinewith curved mayo scissors and the peritoneum was entered bluntly. An Alexis retractor was placed to aid in visualization of the uterus. A bladder flap was not developed. The lower utterine segment was noted to be extremely thin. A low transverse uterine incision was made. The infant was successfully delivered from cephalic presentation, the umbilical cord was clamped after 1 minute. Cord ph was not sent, and cord blood was obtained for evaluation. The placenta was removed Intact and appeared normal. The uterine incision was closed with a single layer running locked suture of 0-Monocryl. Due to ongoing bleeding from the hysterotomy figure of eight sutures of 0 Monocryl were placed after which there was excellent hemostasis. The abdomen and the pelvis were cleared of all clot and debris and the Jon Gills was removed. Hemostasis was confirmed on all surfaces.  The peritoneum  was reapproximated using 2-0 vicryl . The fascia was then closed using 0 Vicryl in a running fashion. The subcutaneous layer was reapproximated with 2-0 plain gut suture. The skin was closed with a 4-0 vicryl subcuticular stitch. The patient tolerated the procedure well. Sponge, lap, instrument and needle counts were correct x 2. She was taken to the recovery room in stable condition.  Disposition: PACU - hemodynamically stable.   Patient's lower uterine segment was extremely thin. Discussed with patient and recommended no further pregnancies.    Signed: Celedonio Savage, MD [facility] Fellow  Center for [facility], [facility]

## 2023-05-18 NOTE — H&P (Signed)
Faculty Practice H&P  Krystal Murray is a 32 y.o. female [phone] with IUP at [redacted]w[redacted]d presenting for repeat cesarean section for prior csx2. Pregnancy was been complicated by prior cxx2, HSV, anemia.    Pt states she has been having no contractions, no vaginal bleeding, intact membranes, with normal fetal movement.     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  She plans to bottle feed  Prenatal labs and studies: ABO, Rh: --/--/A NEG (05/26 0843) Antibody: POS (05/26 0843) Rubella: 10.80 (11/08 1057) RPR: NON REACTIVE (05/26 0843)  HBsAg: Negative (11/08 1057)  HIV: Non Reactive (04/04 1412)  GBS:    2hr Glucola: negative Genetic screening: normal Anatomy US: normal  Past Medical History:  Past Medical History:  Diagnosis Date   History of positive PCR for herpes simplex virus type 2 (HSV-2) DNA    Medical history non-contributory    UTI (lower urinary tract infection)     Past Surgical History:  Past Surgical History:  Procedure Laterality Date   CESAREAN SECTION N/A 02/07/2018   Procedure: CESAREAN SECTION;  Surgeon: Hermina Staggers, MD;  Location: [facility] BIRTHING SUIT[facility];  Service: Obstetrics;  Laterality: N/A;   CESAREAN SECTION  01/28/2020    Obstetrical History:  OB History     Gravida  7   Para  4   Term  4   Preterm  0   AB  2   Living  4      SAB  1    IAB  1   Ectopic  0   Multiple      Live Births  4           Gynecological History:  OB History     Gravida  7   Para  4   Term  4   Preterm  0   AB  2   Living  4      SAB  1   IAB  1   Ectopic  0   Multiple      Live Births  4           Social History:  Social History   Socioeconomic History   Marital status: Single    Spouse name: Not on file   Number of children: Not on file   Years of education: Not on file   Highest education level: Not on file  Occupational History   Not on file  Tobacco Use   Smoking status: Never    Passive exposure: Never   Smokeless tobacco: Never  Vaping Use   Vaping Use: Never used  Substance and Sexual Activity   Alcohol use: Not Currently    Comment: not since confirmed pregnancy   Drug use: Not Currently    Types: Marijuana    Comment: not since confirmed pregnancy   Sexual activity: Yes    Partners: Male    Birth control/protection: None  Other  Topics Concern   Not on file  Social History Narrative   ** Merged History Encounter **       Social Determinants of Health   Financial Resource Strain: Not on file  Food Insecurity: Not on file  Transportation Needs: Not on file  Physical Activity: Not on file  Stress: Not on file  Social Connections: Not on file    Family History:  Family History  Problem Relation Age of Onset   Cancer Maternal Grandmother        breastt   Cancer Maternal Aunt        uterine   Diabetes Maternal Aunt    Hypertension Maternal Aunt    Cancer Cousin        leaukemia    Medications:  Prenatal vitamins,  No current facility-administered medications for this encounter.    Allergies: No Known Allergies  Review of Systems: -  Negative   Physical Exam: Height 5\' 7"  (1.702 m), weight 104.3 kg, last menstrual period 08/17/2022, currently breastfeeding. GENERAL: Well-developed, well-nourished female in no acute distress.  LUNGS: Clear to auscultation  bilaterally.  HEART: Regular rate and rhythm. ABDOMEN: Soft, nontender, nondistended, gravid. FHT:  Baseline rate 150 bpm   Variability moderate    Pertinent Labs/Studies:   Lab Results  Component Value Date   WBC 9.4 05/16/2023   HGB 8.4 (L) 05/16/2023   HCT 27.3 (L) 05/16/2023   MCV 83.2 05/16/2023   PLT 168 05/16/2023    Assessment : Krystal Murray is a 32 y.o. [id number] at [redacted]w[redacted]d being admitted for cesarean section secondary to prior CS.  Plan: The risks of cesarean section discussed with the patient included but were not limited to: bleeding which may require transfusion or reoperation; infection which may require antibiotics; injury to bowel, bladder, ureters or other surrounding organs; injury to the fetus; need for additional procedures including hysterectomy in the event of a life-threatening hemorrhage; placental abnormalities wth subsequent pregnancies, incisional problems, thromboembolic phenomenon and other postoperative/anesthesia complications. The patient concurred with the proposed plan, giving informed written consent for the procedure.   Patient has been NPO since midnight and will remain NPO for procedure.  Preoperative prophylactic Ancef ordered on call to the OR.    Celedonio Savage, MD 05/18/2023, 8:07 AM

## 2023-05-18 NOTE — Lactation Note (Signed)
This note was copied from a baby's chart. Lactation Consultation Note  Patient Name: Boy Jelecia Shavers ZOXWR'U Date: 05/18/2023 Age:31 hours  Exp BF mom As LC entered the room , baby awake and per mom had recently fed  10 mins. Baby still rooting and LC offered to assess. Mom latched the baby and LC added pillow support and checked the lip lines and adjusted the alignment. LC pointed out the increased swallows with breast compressions.  Latch score 8  Per mom comfortable.  LC recommended for mom to call her [employer] to check on a DEBP . And Mentioned to mom she would be able to obtain a hand pump prior to D/C .      Maternal Data    Feeding    LATCH Score Latch: Grasps breast easily, tongue down, lips flanged, rhythmical sucking.  Audible Swallowing: A few with stimulation  Type of Nipple: Everted at rest and after stimulation  Comfort (Breast/Nipple): Soft / non-tender  Hold (Positioning): Assistance needed to correctly position infant at breast and maintain latch.  LATCH Score: 8    Interventions  Education   Discharge [facility] Program: Yes  Consult Status  Follow - up 5/29      Matilde Sprang [facility] 05/18/2023, 4:37 PM

## 2023-05-18 NOTE — Anesthesia Postprocedure Evaluation (Signed)
Anesthesia Post Note  Patient: Krystal Murray  Procedure(s) Performed: CESAREAN SECTION (Abdomen)     Patient location during evaluation: PACU Anesthesia Type: Spinal Level of consciousness: awake Pain management: pain level controlled Vital Signs Assessment: post-procedure vital signs reviewed and stable Respiratory status: spontaneous breathing, nonlabored ventilation and respiratory function stable Cardiovascular status: blood pressure returned to baseline and stable Postop Assessment: no apparent nausea or vomiting Anesthetic complications: no   No notable events documented.  Last Vitals:  Vitals:   05/18/23 1235 05/18/23 1331  BP: 118/73 (!) 119/90  Pulse: 63   Resp: 16 16  Temp: (!) 36.2 C 36.4 C  SpO2: 100% 99%    Last Pain:  Vitals:   05/18/23 1331  TempSrc: Axillary  PainSc: 6    Pain Goal: Patients Stated Pain Goal: 4 (05/18/23 1220)              Epidural/Spinal Function Cutaneous sensation: Able to Wiggle Toes (05/18/23 1331), Patient able to flex knees: Yes (05/18/23 1331)  Kenndra Morris P Kamaron Deskins

## 2023-05-18 NOTE — Anesthesia Preprocedure Evaluation (Addendum)
Anesthesia Evaluation  Patient identified by MRN, date of birth, ID band Patient awake    Reviewed: Allergy & Precautions, H&P , NPO status , Patient's Chart, lab work & pertinent test results  History of Anesthesia Complications Negative for: history of anesthetic complications  Airway Mallampati: II       Dental no notable dental hx.    Pulmonary neg pulmonary ROS   Pulmonary exam normal        Cardiovascular negative cardio ROS Normal cardiovascular exam     Neuro/Psych  PSYCHIATRIC DISORDERS      negative neurological ROS     GI/Hepatic negative GI ROS, Neg liver ROS,,,  Endo/Other  negative endocrine ROS    Renal/GU negative Renal ROS     Musculoskeletal   Abdominal  (+) + obese  Peds  Hematology  (+) Blood dyscrasia, anemia   Anesthesia Other Findings Repeat Cesarean Section  Reproductive/Obstetrics (+) Pregnancy                             Anesthesia Physical Anesthesia Plan  ASA: 2  Anesthesia Plan: Spinal   Post-op Pain Management:    Induction:   PONV Risk Score and Plan: 2 and Ondansetron, Dexamethasone and Treatment may vary due to age or medical condition  Airway Management Planned: Natural Airway  Additional Equipment:   Intra-op Plan:   Post-operative Plan:   Informed Consent: I have reviewed the patients History and Physical, chart, labs and discussed the procedure including the risks, benefits and alternatives for the proposed anesthesia with the patient or authorized representative who has indicated his/her understanding and acceptance.     Dental advisory given  Plan Discussed with: CRNA  Anesthesia Plan Comments:        Anesthesia Quick Evaluation

## 2023-05-19 LAB — CBC
HCT: 24.8 % — ABNORMAL LOW (ref 36.0–46.0)
Hemoglobin: 7.5 g/dL — ABNORMAL LOW (ref 12.0–15.0)
MCH: 25.5 pg — ABNORMAL LOW (ref 26.0–34.0)
MCHC: 30.2 g/dL (ref 30.0–36.0)
MCV: 84.4 fL (ref 80.0–100.0)
Platelets: 167 10*3/uL (ref 150–400)
RBC: 2.94 MIL/uL — ABNORMAL LOW (ref 3.87–5.11)
RDW: 19.8 % — ABNORMAL HIGH (ref 11.5–15.5)
WBC: 15.6 10*3/uL — ABNORMAL HIGH (ref 4.0–10.5)
nRBC: 0 % (ref 0.0–0.2)

## 2023-05-19 LAB — RH IG WORKUP (INCLUDES ABO/RH): Fetal Screen: NEGATIVE

## 2023-05-19 MED ORDER — GABAPENTIN 100 MG PO CAPS
200.0000 mg | ORAL_CAPSULE | Freq: Two times a day (BID) | ORAL | Status: DC
  Administered 2023-05-19: 200 mg via ORAL
  Filled 2023-05-19: qty 2

## 2023-05-19 MED ORDER — RHO D IMMUNE GLOBULIN 1500 UNIT/2ML IJ SOSY
300.0000 ug | PREFILLED_SYRINGE | Freq: Once | INTRAMUSCULAR | Status: AC
  Administered 2023-05-19: 300 ug via INTRAVENOUS
  Filled 2023-05-19: qty 2

## 2023-05-19 MED ORDER — OXYCODONE HCL 5 MG PO TABS
5.0000 mg | ORAL_TABLET | Freq: Once | ORAL | Status: AC
  Administered 2023-05-19: 5 mg via ORAL
  Filled 2023-05-19: qty 1

## 2023-05-19 NOTE — Social Work (Signed)
CSW acknowledges consult.  CSW attempted to meet with MOB, however MOB had several room guest.  CSW will attempt to visit with MOB at a later time.   Anacaren Kohan, LCSWA Clinical Social Worker [phone] 

## 2023-05-19 NOTE — Lactation Note (Addendum)
This note was copied from a baby's chart. Lactation Consultation Note  Patient Name: Krystal Murray ZOXWR'U Date: 05/19/2023 Age:31 hours Reason for consult: Follow-up assessment;Term. Infant's weight loss -0.87% P5, term female infant, Per Birth Parent,  infant is breastfeeding well and he recently BF for 20 minutes on both breast and  afterwards infant was supplemented with 30 mls of 20 kcal formula at 1850 pm prior to [facility] entering the room. Birth Parent  has not used  the DEBP today, when  [facility] was reviewing how to use the DEBP,  Per Birth Parent, she has been turning the  dial on  the DEBP to it's highest setting. LC explain doing this will not make her milk come in faster and may cause breast soreness and tenderness. LC suggested a lower setting and pumping every 3 hours for 15 minutes.   Birth Parent will continue to latch infant first for every feeding 8 to 12 times within 24 hours, STS and afterwards supplement infant with any EBM first and then formula.   Maternal Data    Feeding Mother's Current Feeding Choice: Breast Milk and Formula  LATCH Score                    Lactation Tools Discussed/Used    Interventions Interventions: DEBP;Education  Discharge    Consult Status Consult Status: Follow-up Date: 05/20/23 Follow-up type: In-patient    Frederico Hamman 05/19/2023, 7:24 PM

## 2023-05-19 NOTE — Progress Notes (Signed)
POSTPARTUM PROGRESS NOTE  POD #1  Subjective:  Krystal Murray is a 32 y.o. [id number] s/p rLTCS at [redacted]w[redacted]d.  She reports she doing well. No acute events overnight. She reports she is doing well. She denies any problems with ambulating, voiding or po intake. Denies nausea, vomiting, headaches, vision changes, shortness of breath, lightheadedness with standing or fatigue. She is unsure if she has passed flatus and has not had a bowel movement. Pain is well controlled, exacerbated with movement.  Lochia is appropriate.  Objective: Blood pressure (!) 142/57, pulse 70, temperature 97.8 F (36.6 C), temperature source Oral, resp. rate 18, height 5\' 5"  (1.651 m), weight 104.3 kg, last menstrual period 08/17/2022, SpO2 99 %, unknown if currently breastfeeding.  Physical Exam:  General: alert, cooperative and no distress Chest: no respiratory distress Heart:regular rate, distal pulses intact Abdomen: soft, nontender,  Uterine Fundus: firm, appropriately tender DVT Evaluation: No calf swelling or tenderness Extremities: No edema Skin: warm, dry; incision clean/dry/intact w/ intact honeycomb dressing in place  Recent Labs    05/16/23 0843 05/19/23 0526  HGB 8.4* 7.5*  HCT 27.3* 24.8*    Assessment/Plan: Krystal Murray is a 32 y.o. [id number] s/p rLTCS at [redacted]w[redacted]d for repeat cs.  POD#1 - Doing welll; pain well controlled.   Routine postpartum care  OOB, ambulated  Lovenox for VTE prophylaxis Anemia: asymptomatic  S/p 1/2 Venofer 5/28 @ 1330  5/29 @0526  Hbg 7.5  Start po ferrous sulfate BID  Needs circumcision consent.  Contraception: declined Feeding: breast  Dispo: Plan for discharge tomorrow.   LOS: 1 day   Krystal Murray, Medical Student  05/19/2023, 6:58 AM  Fellow Attestation  I saw and evaluated the patient, performing the key elements of the service.I  personally performed or re-performed the history, physical exam, and medical decision making activities of this service and  have verified that the service and findings are accurately documented in the student's note. I developed the management plan that is described in the medical student's note, and I agree with the content, with my edits above.    Derrel Nip, MD

## 2023-05-19 NOTE — Clinical Social Work Maternal (Signed)
CLINICAL SOCIAL WORK MATERNAL/CHILD NOTE   Patient Details  Name: Lavina U Morganti MRN: [id number] Date of Birth: 08/25/1991   Date:  05/19/2023   Clinical Social Worker Initiating Note:  Charleigh Correnti      Date/Time: Initiated:  05/19/23/1453      Child's Name:  Adore Shiley 05/18/2023    Biological Parents:  Mother, Father (Catrinia Neyland 05/16/1992, Daquan Monk 01/17/1993)    Need for Interpreter:  None    Reason for Referral:  Behavioral Health Concerns    Address:  [address] [place] [address]    Phone number:  [phone] (home)      Additional phone number:    Household Members/Support Persons (HM/SP):   Household Member/Support Person 1, Household Member/Support Person 2, Household Member/Support Person 3, Household Member/Support Person 4     HM/SP Name Relationship DOB or Age  HM/SP -1 Ahiyah daughter 10/30/2009  HM/SP -2 Aricka daughter 09/02/2012  HM/SP -3 Aionia daughter 02/07/2018  HM/SP -4 Adir son 01/27/2021  HM/SP -5     HM/SP -6     HM/SP -7     HM/SP -8         Natural Supports (not living in the home):  Parent    Professional Supports: None    Employment: Unemployed    Type of Work:      Education:  High school graduate    Homebound arranged:     Financial Resources:  Medicaid    Other Resources:  Food Stamps  , WIC    Cultural/Religious Considerations Which May Impact Care:     Strengths:  Ability to meet basic needs  , Home prepared for child  , Pediatrician chosen    Psychotropic Medications:          Pediatrician:    [place] area   Pediatrician List:    [facility]  [place]   [place]   [place]   [place]   [place]       Pediatrician Fax Number:     Risk Factors/Current Problems:  Mental Health Concerns      Cognitive State:  Able to Concentrate  , Alert      Mood/Affect:  Calm  , Comfortable      CSW Assessment: CSW received consult for MH  concerns. CSW met with MOB to complete assessment and offer resources. CSW entered the room and observed infant lying in bassinet and MOB walking around the room. CSW introduced self, CSW role and reason for visit, MOB was agreeable to visit. CSW inquired about how MOB was feeling, MOB reported feeling a little sore but good and happy. CSW congratulated MOB. CSW verified MOB address and phone number, MOB confirmed the information was correct.    CSW inquired about MOB MH hx, MOB reported she was never diagnosed, CSW inquired about MOB substance induced psychosis in 2016, MOB reported it was not true. CSW inquired about MOB's hospitalization in 2022, MOB reported she was instructed to have an assessment completed by CPS in order to get her children back. MOB reported no concerns since that hospitalization. CSW assessed for safety, MOB denied any SI, HI or DV. CSW provided education regarding the baby blues period vs. perinatal mood disorders, discussed treatment and gave resources for mental health follow up if concerns arise.  CSW recommends self-evaluation during the postpartum time period using the New Mom Checklist from Postpartum Progress and encouraged MOB to contact a medical professional if symptoms are   noted at any time.  MOB identified her mom and dad as her supports.    CSW inquired about MOB CPS hx, MOB reported she has custody of her children and they currently  live with her, MOB reported the CPS case has since been closed.   CSW provided review of Sudden Infant Death Syndrome (SIDS) precautions.  MOB reported she has all necessary items for the infant including a bassinet and car seat.  CSW identifies no further need for intervention and no barriers to discharge at this time.   CSW Plan/Description:  No Further Intervention Required/No Barriers to Discharge, Sudden Infant Death Syndrome (SIDS) Education, Perinatal Mood and Anxiety Disorder (PMADs) Education, Other Information/Referral to  Community Resources      Tannisha Kennington M Dalinda Heidt, LCSW 05/19/2023, 2:58 PM 

## 2023-05-19 NOTE — Progress Notes (Signed)
Circumcision Consent  Discussed with mom at bedside about circumcision.   Circumcision is a surgery that removes the skin that covers the tip of the penis, called the "foreskin." Circumcision is usually done when a boy is between 26 and 34 days old, sometimes up to 29-42 weeks old.  The most common reasons boys are circumcised include for cultural/religious beliefs or for parental preference (potentially easier to clean, so baby looks like daddy, etc).  There may be some medical benefits for circumcision:   Circumcised boys seem to have slightly lower rates of: ? Urinary tract infections (per the American Academy of Pediatrics an uncircumcised boy has a 1/100 chance of developing a UTI in the first year of life, a circumcised boy at a 12/998 chance of developing a UTI in the first year of life- a 10% reduction) ? Penis cancer (typically rare- an uncircumcised female has a 1 in 100,000 chance of developing cancer of the penis) ? Sexually transmitted infection (in endemic areas, including HIV, HPV and Herpes- circumcision does NOT protect against gonorrhea, chlamydia, trachomatis, or syphilis) ? Phimosis: a condition where that makes retraction of the foreskin over the glans impossible (0.4 per 1000 boys per year or 0.6% of boys are affected by their 15th birthday)  Boys and men who are not circumcised can reduce these extra risks by: ? Cleaning their penis well ? Using condoms during sex  What are the risks of circumcision?  As with any surgical procedure, there are risks and complications. In circumcision, complications are rare and usually minor, the most common being: ? Bleeding- risk is reduced by holding each clamp for 30 seconds prior to a cut being made, and by holding pressure after the procedure is done ? Infection- the penis is cleaned prior to the procedure, and the procedure is done under sterile technique ? Damage to the urethra or amputation of the penis  How is circumcision done  in baby boys?  The baby will be placed on a special table and the legs restrained for their safety. Numbing medication is injected into the penis, and the skin is cleansed with betadine to decrease the risk of infection.   What to expect:  The penis will look red and raw for 5-7 days as it heals. We expect scabbing around where the cut was made, as well as clear-pink fluid and some swelling of the penis right after the procedure. If your baby's circumcision starts to bleed or develops pus, please contact your pediatrician immediately.  All questions were answered and mother consented.  Celedonio Savage, MD 7:35 AM

## 2023-05-20 ENCOUNTER — Other Ambulatory Visit: Payer: Self-pay

## 2023-05-20 LAB — RH IG WORKUP (INCLUDES ABO/RH)
Gestational Age(Wks): 39
Unit division: 0

## 2023-05-20 MED ORDER — FERROUS SULFATE 325 (65 FE) MG PO TABS
325.0000 mg | ORAL_TABLET | Freq: Every day | ORAL | Status: DC
  Administered 2023-05-20 – 2023-05-21 (×2): 325 mg via ORAL
  Filled 2023-05-20 (×2): qty 1

## 2023-05-20 MED ORDER — GABAPENTIN 300 MG PO CAPS
300.0000 mg | ORAL_CAPSULE | Freq: Three times a day (TID) | ORAL | Status: DC
  Administered 2023-05-20 – 2023-05-21 (×4): 300 mg via ORAL
  Filled 2023-05-20 (×4): qty 1

## 2023-05-20 MED ORDER — OXYCODONE HCL 5 MG PO TABS
5.0000 mg | ORAL_TABLET | Freq: Once | ORAL | Status: AC
  Administered 2023-05-20: 5 mg via ORAL
  Filled 2023-05-20: qty 1

## 2023-05-20 NOTE — Progress Notes (Signed)
POSTPARTUM PROGRESS NOTE  POD #2  Subjective:  Krystal Murray is a 32 y.o. [id number] s/p rLTCS at [redacted]w[redacted]d.  She reports she doing  MODERATELY well. No acute events overnight. She reports she is doing well. She denies any problems with ambulating, voiding or po intake. Denies nausea, vomiting, headaches, vision changes, shortness of breath, lightheadedness with standing or fatigue. She HAD flatus. Pain is NOT well controlled, exacerbated with movement, described as burning.  Lochia is appropriate.  Objective: Blood pressure 92/79, pulse 75, temperature 98.2 F (36.8 C), temperature source Oral, resp. rate 18, height 5\' 5"  (1.651 m), weight 104.3 kg, last menstrual period 08/17/2022, SpO2 99 %, unknown if currently breastfeeding.  Physical Exam:  General: alert, cooperative and no distress Chest: no respiratory distress Heart:regular rate, distal pulses intact Abdomen: soft, nontender,  Uterine Fundus: firm, appropriately tender DVT Evaluation: No calf swelling or tenderness Extremities: No edema Skin: warm, dry; incision clean/dry/intact w/ intact honeycomb dressing in place  Recent Labs    05/19/23 0526  HGB 7.5*  HCT 24.8*     Assessment/Plan: Krystal Murray is a 32 y.o. [id number] s/p rLTCS at [redacted]w[redacted]d for repeat cs.  POD#1 - Doing welll; pain NOT well controlled.   Routine postpartum care  OOB, ambulated  Lovenox for VTE prophylaxis  Adjusted pain meds, Abd binder Anemia: asymptomatic  S/p 1/2 Venofer 5/28 @ 1330  5/29 @0526  Hbg 7.5  Cont po ferrous sulfate BID  Contraception: declined Feeding: breast  Dispo: Plan for discharge tomorrow.   LOS: 2 days   Myrtie Hawk, DO FMOB Fellow, Faculty practice [facility], Center for [facility] 05/20/23  8:07 AM
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
## 2023-05-21 ENCOUNTER — Other Ambulatory Visit (HOSPITAL_COMMUNITY): Payer: Self-pay

## 2023-05-21 ENCOUNTER — Encounter: Payer: Self-pay | Admitting: Obstetrics

## 2023-05-21 MED ORDER — IBUPROFEN 600 MG PO TABS
600.0000 mg | ORAL_TABLET | Freq: Four times a day (QID) | ORAL | 0 refills | Status: DC
  Filled 2023-05-21: qty 30, 8d supply, fill #0

## 2023-05-21 MED ORDER — NIFEDIPINE ER 30 MG PO TB24
30.0000 mg | ORAL_TABLET | Freq: Every day | ORAL | 1 refills | Status: DC
  Filled 2023-05-21: qty 30, 30d supply, fill #0

## 2023-05-21 MED ORDER — NIFEDIPINE ER OSMOTIC RELEASE 30 MG PO TB24
30.0000 mg | ORAL_TABLET | Freq: Every day | ORAL | Status: DC
  Administered 2023-05-21: 30 mg via ORAL
  Filled 2023-05-21: qty 1

## 2023-05-21 MED ORDER — FUROSEMIDE 20 MG PO TABS
20.0000 mg | ORAL_TABLET | Freq: Every day | ORAL | Status: DC
  Administered 2023-05-21: 20 mg via ORAL
  Filled 2023-05-21: qty 1

## 2023-05-21 MED ORDER — OXYCODONE-ACETAMINOPHEN 7.5-325 MG PO TABS
1.0000 | ORAL_TABLET | Freq: Four times a day (QID) | ORAL | 0 refills | Status: DC | PRN
  Filled 2023-05-21: qty 12, 3d supply, fill #0

## 2023-05-21 MED ORDER — GABAPENTIN 300 MG PO CAPS
300.0000 mg | ORAL_CAPSULE | Freq: Three times a day (TID) | ORAL | 0 refills | Status: DC
  Filled 2023-05-21: qty 90, 30d supply, fill #0

## 2023-05-21 MED ORDER — FUROSEMIDE 20 MG PO TABS: 20.0000 mg | ORAL_TABLET | Freq: Every day | ORAL | Status: DC

## 2023-05-21 MED ORDER — FUROSEMIDE 20 MG PO TABS
20.0000 mg | ORAL_TABLET | Freq: Every day | ORAL | 0 refills | Status: DC
  Filled 2023-05-21: qty 4, 4d supply, fill #0

## 2023-05-21 MED ORDER — FERROUS SULFATE 325 (65 FE) MG PO TABS
325.0000 mg | ORAL_TABLET | Freq: Every day | ORAL | 0 refills | Status: DC
  Filled 2023-05-21: qty 100, 100d supply, fill #0

## 2023-05-21 MED ORDER — OXYCODONE-ACETAMINOPHEN 7.5-325 MG PO TABS
1.0000 | ORAL_TABLET | Freq: Four times a day (QID) | ORAL | Status: DC | PRN
  Administered 2023-05-21: 1 via ORAL
  Filled 2023-05-21: qty 1

## 2023-05-21 MED ORDER — NIFEDIPINE ER OSMOTIC RELEASE 30 MG PO TB24: 30.0000 mg | ORAL_TABLET | Freq: Every day | ORAL | Status: DC

## 2023-05-21 NOTE — Lactation Note (Signed)
This note was copied from a baby's chart. Lactation Consultation Note  Patient Name: Boy Delanda Rempfer [id number] Date: 05/21/2023 Age:31 hours, baby back to birth weight and over  Reason for consult: Follow-up assessment;Term (per mom milk is in and recently fed on the left breast. mom expressed she wasn't sure if the Rt Br was engorged. LC offered to assess and just full.) LC reviewed BF D/C teaching and encouraged mom now since her milk is in to establish and protect her milk supply to breast feed . If she is not latching , pump.  Mom aware of the [facility] resources after delivery.   Maternal Data    Feeding Mother's Current Feeding Choice: Breast Milk and Formula Nipple Type: Extra Slow Flow  LATCH Score - 8   Baby recently fed and LC unable to assess latch.   Lactation Tools Discussed/Used Tools: Pump;Flanges Breast pump type: Double-Electric Breast Pump;Manual Pump Education: Milk Storage;Setup, frequency, and cleaning;Other (comment) (reviewed) Reason for Pumping: PRN  Interventions Interventions: Breast feeding basics reviewed;Hand pump;DEBP;Education;LC Services brochure  Discharge Discharge Education: Engorgement and breast care;Warning signs for feeding baby Pump: Manual  Consult Status Consult Status: Complete Date: 05/21/23    Kathrin Greathouse 05/21/2023, 12:19 PM

## 2023-05-24 ENCOUNTER — Other Ambulatory Visit: Payer: Self-pay | Admitting: Obstetrics and Gynecology

## 2023-05-24 ENCOUNTER — Encounter: Payer: Self-pay | Admitting: *Deleted

## 2023-05-24 MED ORDER — OXYCODONE-ACETAMINOPHEN 7.5-325 MG PO TABS
1.0000 | ORAL_TABLET | Freq: Three times a day (TID) | ORAL | 0 refills | Status: DC | PRN
Start: 2023-05-24 — End: 2023-07-23

## 2023-05-24 NOTE — Progress Notes (Signed)
Refill of percocet sent , #20 due to very small amount being sent post c section.  No further refills to be given without evaluation.  Mariel Aloe, MD

## 2023-05-25 ENCOUNTER — Encounter: Payer: Self-pay | Admitting: Obstetrics and Gynecology

## 2023-05-25 ENCOUNTER — Ambulatory Visit: Payer: Medicaid Other

## 2023-05-25 VITALS — BP 136/80 | HR 102

## 2023-05-25 DIAGNOSIS — Z5189 Encounter for other specified aftercare: Secondary | ICD-10-CM

## 2023-05-25 NOTE — Progress Notes (Addendum)
..   Subjective:     Krystal Murray is a 32 y.o. female who presents to the clinic 1 weeks status post  c-section . Eating a regular diet without difficulty. Bowel movements are normal. Pain is not well controlled.  Medications being used: ibuprofen (OTC) and narcotic analgesics including oxycodone/acetaminophen (Percocet, Tylox).      Objective:    BP 136/80   Pulse (!) 102   LMP 08/17/2022 (Approximate)  General:  alert, cooperative, and no distress  Abdomen: soft, bowel sounds active, non-tender  Incision:   healing well, no drainage, no erythema, no hernia, no seroma, no swelling, no dehiscence     Assessment:    Doing well postoperatively.   Plan:    1. Continue any current medications. 2. Removed honeycomb dressing today in office. Wound care discussed. 3. Activity restrictions: No lifting more than 15-20lbs, no repetitive motions 4. Anticipated return to work:  To be determined . 5. Follow up: 5 weeks for postpartum 07/01/2023  Dr. Jolayne Panther assessed incision and provided pt with reassurance.    Katrina Stack, RN

## 2023-06-08 ENCOUNTER — Encounter: Payer: Self-pay | Admitting: Obstetrics and Gynecology

## 2023-06-11 ENCOUNTER — Encounter: Payer: Self-pay | Admitting: Obstetrics

## 2023-07-01 ENCOUNTER — Ambulatory Visit: Payer: Medicaid Other | Admitting: Obstetrics and Gynecology

## 2023-07-07 ENCOUNTER — Ambulatory Visit: Payer: Medicaid Other | Admitting: Student

## 2023-07-07 ENCOUNTER — Encounter: Payer: Self-pay | Admitting: Student

## 2023-07-07 DIAGNOSIS — Z3042 Encounter for surveillance of injectable contraceptive: Secondary | ICD-10-CM

## 2023-07-07 LAB — POCT URINE PREGNANCY: Preg Test, Ur: NEGATIVE

## 2023-07-07 MED ORDER — MEDROXYPROGESTERONE ACETATE 150 MG/ML IM SUSP
150.0000 mg | INTRAMUSCULAR | 0 refills | Status: DC
Start: 2023-07-07 — End: 2024-06-30

## 2023-07-07 MED ORDER — MEDROXYPROGESTERONE ACETATE 150 MG/ML IM SUSP
150.0000 mg | Freq: Once | INTRAMUSCULAR | Status: AC
Start: 2023-07-07 — End: 2023-07-07
  Administered 2023-07-07: 150 mg via INTRAMUSCULAR

## 2023-07-07 NOTE — Progress Notes (Signed)
Post Partum Visit Note  Krystal Murray is a 32 y.o. [phone] female who presents for a postpartum visit. She is 7 weeks postpartum following a repeat cesarean section.  I have fully reviewed the prenatal and intrapartum course. The delivery was at 39.1 gestational weeks.  Anesthesia: spinal. Postpartum course has been uncomplacated . Baby is doing well. Baby is feeding by bottle - Similac Advance. Bleeding no bleeding. Bowel function is normal. Bladder function is normal. Patient is sexually active. Contraception method is condoms. Postpartum depression screening: negative.   The pregnancy intention screening data noted above was reviewed. Potential methods of contraception were discussed. The patient elected to proceed with No data recorded.   Edinburgh Postnatal Depression Scale - 07/07/23 1541       Edinburgh Postnatal Depression Scale:  In the Past 7 Days   I have been able to laugh and see the funny side of things. 0    I have looked forward with enjoyment to things. 0    I have blamed myself unnecessarily when things went wrong. 0    I have been anxious or worried for no good reason. 0    I have felt scared or panicky for no good reason. 0    Things have been getting on top of me. 0    I have been so unhappy that I have had difficulty sleeping. 0    I have felt sad or miserable. 0    I have been so unhappy that I have been crying. 0    The thought of harming myself has occurred to me. 0    Edinburgh Postnatal Depression Scale Total 0             Health Maintenance Due  Topic Date Due   COVID-19 Vaccine (1 - 2023-24 season) Never done    The following portions of the patient's history were reviewed and updated as appropriate: allergies, current medications, past family history, past medical history, past social history, past surgical history, and problem list.  Review of Systems Pertinent items are noted in HPI.  Objective:  BP 104/66   Pulse 66   Ht 5\' 7"   (1.702 m)   Wt 215 lb 1.6 oz (97.6 kg)   LMP 08/17/2022 (Approximate)   BMI 33.69 kg/m    General:  alert, cooperative, and no distress   Breasts:  not indicated  Lungs: Normal work of breathing  Heart:  regular rate and rhythm  Abdomen: soft, non-tender; bowel sounds normal; no masses,  no organomegaly   Wound well approximated incision  GU exam:  not indicated       Assessment:   1. Encounter for routine postpartum follow-up  2. Encounter for management and injection of depo-Provera - POCT urine pregnancy - medroxyPROGESTERone (DEPO-PROVERA) injection 150 mg - medroxyPROGESTERone (DEPO-PROVERA) 150 MG/ML injection; Inject 1 mL (150 mg total) into the muscle every 3 (three) months.  Dispense: 1 mL; Refill: 0  Normal postpartum exam.   Plan:   Essential components of care per ACOG recommendations:  1.  Mood and well being: Patient with negative depression screening today. Reviewed local resources for support.  - Patient tobacco use? No.   - hx of drug use? No.    2. Infant care and feeding:  -Patient currently breastmilk feeding? No.  -Social determinants of health (SDOH) reviewed in EPIC. No concerns  3. Sexuality, contraception and birth spacing - Patient does not want a pregnancy in the next year.  Desired  family size is 5 children.  - Reviewed forms of contraception in tiered fashion. Patient desired Depo-Provera today.   - Discussed birth spacing of 18 months  4. Sleep and fatigue -Encouraged family/partner/community support of 4 hrs of uninterrupted sleep to help with mood and fatigue  5. Physical Recovery  - Discussed patients delivery and complications. She describes her labor as good. - Patient had a C-section repeat; no problems after deliver. Perineal healing reviewed. Patient expressed understanding - Patient has urinary incontinence? No. - Patient is safe to resume physical and sexual activity  6.  Health Maintenance - HM due items addressed Yes -  Last pap smear  Diagnosis  Date Value Ref Range Status  10/28/2022      - Negative for intraepithelial lesion or malignancy (NILM)   Pap smear not done at today's visit.  -Breast Cancer screening indicated? No.   7. Chronic Disease/Pregnancy Condition follow up: None  - PCP follow up  Corlis Hove, NP Center for [facility], [facility]

## 2023-07-14 ENCOUNTER — Telehealth: Payer: Medicaid Other | Admitting: Family Medicine

## 2023-07-14 DIAGNOSIS — M545 Low back pain, unspecified: Secondary | ICD-10-CM

## 2023-07-14 DIAGNOSIS — N898 Other specified noninflammatory disorders of vagina: Secondary | ICD-10-CM

## 2023-07-14 DIAGNOSIS — N644 Mastodynia: Secondary | ICD-10-CM

## 2023-07-14 DIAGNOSIS — M25519 Pain in unspecified shoulder: Secondary | ICD-10-CM

## 2023-07-14 NOTE — Progress Notes (Signed)
Because given the several symptoms you are having you need to be seen in person- as it looks like you have been having pain since your C section at the end of May. I feel your condition warrants further evaluation and I recommend that you be seen in a face to face visit.   NOTE: There will be NO CHARGE for this eVisit

## 2023-07-21 ENCOUNTER — Ambulatory Visit (HOSPITAL_COMMUNITY): Payer: Medicaid Other

## 2023-07-23 ENCOUNTER — Inpatient Hospital Stay (HOSPITAL_COMMUNITY): Payer: Medicaid Other

## 2023-07-23 ENCOUNTER — Inpatient Hospital Stay (HOSPITAL_COMMUNITY)
Admission: AD | Admit: 2023-07-23 | Discharge: 2023-07-23 | Disposition: A | Discharge status: Home / Self Care | Payer: Medicaid Other | Attending: Emergency Medicine | Admitting: Emergency Medicine

## 2023-07-23 ENCOUNTER — Other Ambulatory Visit: Payer: Self-pay

## 2023-07-23 DIAGNOSIS — N939 Abnormal uterine and vaginal bleeding, unspecified: Secondary | ICD-10-CM

## 2023-07-23 DIAGNOSIS — R1031 Right lower quadrant pain: Secondary | ICD-10-CM | POA: Insufficient documentation

## 2023-07-23 DIAGNOSIS — Z3202 Encounter for pregnancy test, result negative: Secondary | ICD-10-CM | POA: Insufficient documentation

## 2023-07-23 LAB — CBC WITH DIFFERENTIAL/PLATELET
Abs Immature Granulocytes: 0.02 10*3/uL (ref 0.00–0.07)
Basophils Absolute: 0 10*3/uL (ref 0.0–0.1)
Basophils Relative: 1 %
Eosinophils Absolute: 0.1 10*3/uL (ref 0.0–0.5)
Eosinophils Relative: 1 %
HCT: 38.3 % (ref 36.0–46.0)
Hemoglobin: 11.8 g/dL — ABNORMAL LOW (ref 12.0–15.0)
Immature Granulocytes: 0 %
Lymphocytes Relative: 45 %
Lymphs Abs: 2.8 10*3/uL (ref 0.7–4.0)
MCH: 27.1 pg (ref 26.0–34.0)
MCHC: 30.8 g/dL (ref 30.0–36.0)
MCV: 87.8 fL (ref 80.0–100.0)
Monocytes Absolute: 0.5 10*3/uL (ref 0.1–1.0)
Monocytes Relative: 8 %
Neutro Abs: 2.9 10*3/uL (ref 1.7–7.7)
Neutrophils Relative %: 45 %
Platelets: 219 10*3/uL (ref 150–400)
RBC: 4.36 MIL/uL (ref 3.87–5.11)
RDW: 15.8 % — ABNORMAL HIGH (ref 11.5–15.5)
WBC: 6.4 10*3/uL (ref 4.0–10.5)
nRBC: 0 % (ref 0.0–0.2)

## 2023-07-23 LAB — POCT PREGNANCY, URINE: Preg Test, Ur: NEGATIVE
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
## 2023-07-23 NOTE — Discharge Instructions (Addendum)
Please follow-up with your GYN regarding your symptoms and ER visit.  Today your labs and imaging are reassuring and I will need to follow-up with your GYN about your vaginal bleeding as this may be related to your menses.  If you continue to have vaginal bleeding despite medical therapy you may need a sonogram through GYN.  You may take Tylenol every 6 hours needed for pain.  If symptoms change or worsen please return to ER.
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
## 2023-09-10 ENCOUNTER — Ambulatory Visit: Payer: Medicaid Other | Admitting: Family Medicine

## 2023-09-28 ENCOUNTER — Ambulatory Visit: Payer: Medicaid Other

## 2023-11-09 ENCOUNTER — Ambulatory Visit: Payer: Medicaid Other | Admitting: Family Medicine

## 2024-01-28 ENCOUNTER — Encounter (HOSPITAL_BASED_OUTPATIENT_CLINIC_OR_DEPARTMENT_OTHER): Payer: Self-pay | Admitting: Emergency Medicine

## 2024-01-28 ENCOUNTER — Other Ambulatory Visit: Payer: Self-pay

## 2024-01-28 ENCOUNTER — Emergency Department (HOSPITAL_BASED_OUTPATIENT_CLINIC_OR_DEPARTMENT_OTHER)
Admission: EM | Admit: 2024-01-28 | Discharge: 2024-01-28 | Disposition: A | Discharge status: Home / Self Care | Payer: Medicaid Other | Attending: Emergency Medicine | Admitting: Emergency Medicine

## 2024-01-28 DIAGNOSIS — Z20822 Contact with and (suspected) exposure to covid-19: Secondary | ICD-10-CM | POA: Diagnosis not present

## 2024-01-28 DIAGNOSIS — J101 Influenza due to other identified influenza virus with other respiratory manifestations: Secondary | ICD-10-CM | POA: Insufficient documentation

## 2024-01-28 DIAGNOSIS — E876 Hypokalemia: Secondary | ICD-10-CM | POA: Insufficient documentation

## 2024-01-28 DIAGNOSIS — Z79899 Other long term (current) drug therapy: Secondary | ICD-10-CM | POA: Diagnosis not present

## 2024-01-28 DIAGNOSIS — R030 Elevated blood-pressure reading, without diagnosis of hypertension: Secondary | ICD-10-CM | POA: Insufficient documentation

## 2024-01-28 DIAGNOSIS — R519 Headache, unspecified: Secondary | ICD-10-CM | POA: Diagnosis present

## 2024-01-28 LAB — COMPREHENSIVE METABOLIC PANEL
ALT: 9 U/L (ref 0–44)
AST: 13 U/L — ABNORMAL LOW (ref 15–41)
Albumin: 4 g/dL (ref 3.5–5.0)
Alkaline Phosphatase: 41 U/L (ref 38–126)
Anion gap: 10 (ref 5–15)
BUN: 8 mg/dL (ref 6–20)
CO2: 22 mmol/L (ref 22–32)
Calcium: 9.1 mg/dL (ref 8.9–10.3)
Chloride: 103 mmol/L (ref 98–111)
Creatinine, Ser: 0.87 mg/dL (ref 0.44–1.00)
GFR, Estimated: >60 mL/min (ref >60)
Glucose, Bld: 98 mg/dL (ref 70–99)
Potassium: 3.4 mmol/L — ABNORMAL LOW (ref 3.5–5.1)
Sodium: 135 mmol/L (ref 135–145)
Total Bilirubin: 0.5 mg/dL (ref 0.0–1.2)
Total Protein: 7.7 g/dL (ref 6.5–8.1)

## 2024-01-28 LAB — CBC
HCT: 37.2 % (ref 36.0–46.0)
Hemoglobin: 12.1 g/dL (ref 12.0–15.0)
MCH: 26.7 pg (ref 26.0–34.0)
MCHC: 32.5 g/dL (ref 30.0–36.0)
MCV: 82.1 fL (ref 80.0–100.0)
Platelets: 166 10*3/uL (ref 150–400)
RBC: 4.53 MIL/uL (ref 3.87–5.11)
RDW: 14.5 % (ref 11.5–15.5)
WBC: 3.2 10*3/uL — ABNORMAL LOW (ref 4.0–10.5)
nRBC: 0 % (ref 0.0–0.2)

## 2024-01-28 LAB — RESP PANEL BY RT-PCR (RSV, FLU A&B, COVID)  RVPGX2
Influenza A by PCR: POSITIVE — AB
Influenza B by PCR: NEGATIVE
Resp Syncytial Virus by PCR: NEGATIVE
SARS Coronavirus 2 by RT PCR: NEGATIVE

## 2024-01-28 LAB — PREGNANCY, URINE: Preg Test, Ur: NEGATIVE

## 2024-01-28 MED ORDER — POTASSIUM CHLORIDE CRYS ER 20 MEQ PO TBCR
40.0000 meq | EXTENDED_RELEASE_TABLET | Freq: Once | ORAL | Status: AC
  Administered 2024-01-28: 40 meq via ORAL
  Filled 2024-01-28: qty 2

## 2024-01-28 MED ORDER — TRAMADOL HCL 50 MG PO TABS
50.0000 mg | ORAL_TABLET | Freq: Once | ORAL | Status: AC
  Administered 2024-01-28: 50 mg via ORAL
  Filled 2024-01-28: qty 1

## 2024-01-28 MED ORDER — ACETAMINOPHEN 500 MG PO TABS
1000.0000 mg | ORAL_TABLET | Freq: Once | ORAL | Status: AC
  Administered 2024-01-28: 1000 mg via ORAL
  Filled 2024-01-28: qty 2

## 2024-01-28 NOTE — ED Provider Notes (Signed)
 [place] EMERGENCY DEPARTMENT AT [facility] Provider Note   CSN: [id number] Arrival date & time: 01/28/24  9180     History  Chief Complaint  Patient presents with   Headache    Krystal Murray is a 33 y.o. female.  Pt with non prod cough, body aches, frontal headache, in past few days. No specific known ill contacts. No trouble breathing or swallowing. No acute, abrupt or severe head pain. No neck pain or stiffness. No sinus pain or drainage.  Indicates did have recent prolonged period, similar amount of bleeding to normal period, that ended two days ago. No current vaginal discharge or bleeding. No abd or pelvic pain. No dysuria.   The history is provided by the patient.  Headache Associated symptoms: congestion, cough and myalgias   Associated symptoms: no abdominal pain, no back pain, no diarrhea, no eye pain, no fever, no neck pain, no neck stiffness, no numbness, no sore throat, no vomiting and no weakness        Home Medications Prior to Admission medications   Medication Sig Start Date End Date Taking? Authorizing Provider  medroxyPROGESTERone  (DEPO-PROVERA ) 150 MG/ML injection Inject 1 mL (150 mg total) into the muscle every 3 (three) months. 07/07/23   Forlan, Nicole, NP      Allergies    Patient has no known allergies.    Review of Systems   Review of Systems  Constitutional:  Negative for chills and fever.  HENT:  Positive for congestion and rhinorrhea. Negative for sore throat.   Eyes:  Negative for pain, redness and visual disturbance.  Respiratory:  Positive for cough. Negative for shortness of breath.   Cardiovascular:  Negative for chest pain.  Gastrointestinal:  Negative for abdominal pain, diarrhea and vomiting.  Genitourinary:  Negative for dysuria and flank pain.  Musculoskeletal:  Positive for myalgias. Negative for back pain, neck pain and neck stiffness.  Skin:  Negative for rash.  Neurological:  Positive for headaches. Negative for  weakness and numbness.  Hematological:  Does not bruise/bleed easily.  Psychiatric/Behavioral:  Negative for confusion.     Physical Exam Updated Vital Signs BP (!) 147/87 (BP Location: Left Arm)   Pulse (!) 116   Temp 99.1 F (37.3 C) (Oral)   Resp (!) 22   Ht 1.651 m (5' 5)   SpO2 98%   BMI 34.86 kg/m  Physical Exam Vitals and nursing note reviewed.  Constitutional:      Appearance: Normal appearance. She is well-developed.  HENT:     Head: Atraumatic.     Comments: No sinus or temporal tenderness.     Nose: Nose normal.     Mouth/Throat:     Mouth: Mucous membranes are moist.     Pharynx: Oropharynx is clear. No oropharyngeal exudate or posterior oropharyngeal erythema.  Eyes:     General: No scleral icterus.    Conjunctiva/sclera: Conjunctivae normal.     Pupils: Pupils are equal, round, and reactive to light.  Neck:     Trachea: No tracheal deviation.     Comments: No stiffness or rigidity Cardiovascular:     Rate and Rhythm: Normal rate and regular rhythm.     Pulses: Normal pulses.     Heart sounds: Normal heart sounds. No murmur heard.    No friction rub. No gallop.  Pulmonary:     Effort: Pulmonary effort is normal. No respiratory distress.     Breath sounds: Normal breath sounds.  Abdominal:  General: Bowel sounds are normal. There is no distension.     Palpations: Abdomen is soft.     Tenderness: There is no abdominal tenderness.  Genitourinary:    Comments: No cva tenderness.  Musculoskeletal:        General: No swelling or tenderness.     Cervical back: Normal range of motion and neck supple. No rigidity. No muscular tenderness.     Right lower leg: No edema.     Left lower leg: No edema.  Lymphadenopathy:     Cervical: No cervical adenopathy.  Skin:    General: Skin is warm and dry.     Findings: No rash.  Neurological:     Mental Status: She is alert.     Comments: Alert, speech normal. Motor/sens grossly intact. Steady gait.    Psychiatric:        Mood and Affect: Mood normal.     ED Results / Procedures / Treatments   Labs (all labs ordered are listed, but only abnormal results are displayed) Results for orders placed or performed during the hospital encounter of 01/28/24  Resp panel by RT-PCR (RSV, Flu A&B, Covid) Anterior Nasal Swab   Collection Time: 01/28/24  8:27 AM   Specimen: Anterior Nasal Swab  Result Value Ref Range   SARS Coronavirus 2 by RT PCR NEGATIVE NEGATIVE   Influenza A by PCR POSITIVE (A) NEGATIVE   Influenza B by PCR NEGATIVE NEGATIVE   Resp Syncytial Virus by PCR NEGATIVE NEGATIVE  Pregnancy, urine   Collection Time: 01/28/24  9:41 AM  Result Value Ref Range   Preg Test, Ur NEGATIVE NEGATIVE  CBC   Collection Time: 01/28/24  9:41 AM  Result Value Ref Range   WBC 3.2 (L) 4.0 - 10.5 K/uL   RBC 4.53 3.87 - 5.11 MIL/uL   Hemoglobin 12.1 12.0 - 15.0 g/dL   HCT 62.7 63.9 - 53.9 %   MCV 82.1 80.0 - 100.0 fL   MCH 26.7 26.0 - 34.0 pg   MCHC 32.5 30.0 - 36.0 g/dL   RDW 85.4 88.4 - 84.4 %   Platelets 166 150 - 400 K/uL   nRBC 0.0 0.0 - 0.2 %  Comprehensive metabolic panel   Collection Time: 01/28/24  9:41 AM  Result Value Ref Range   Sodium 135 135 - 145 mmol/L   Potassium 3.4 (L) 3.5 - 5.1 mmol/L   Chloride 103 98 - 111 mmol/L   CO2 22 22 - 32 mmol/L   Glucose, Bld 98 70 - 99 mg/dL   BUN 8 6 - 20 mg/dL   Creatinine, Ser 9.12 0.44 - 1.00 mg/dL   Calcium  9.1 8.9 - 10.3 mg/dL   Total Protein 7.7 6.5 - 8.1 g/dL   Albumin  4.0 3.5 - 5.0 g/dL   AST 13 (L) 15 - 41 U/L   ALT 9 0 - 44 U/L   Alkaline Phosphatase 41 38 - 126 U/L   Total Bilirubin 0.5 0.0 - 1.2 mg/dL   GFR, Estimated >39 >39 mL/min   Anion gap 10 5 - 15     EKG None  Radiology No results found.  Procedures Procedures    Medications Ordered in ED Medications  traMADol  (ULTRAM ) tablet 50 mg (50 mg Oral Given 01/28/24 0911)    ED Course/ Medical Decision Making/ A&P  Medical Decision Making Problems Addressed: Elevated blood pressure reading: acute illness or injury Hypokalemia: acute illness or injury Influenza A: acute illness or injury with systemic symptoms that poses a threat to life or bodily functions  Amount and/or Complexity of Data Reviewed External Data Reviewed: notes. Labs: ordered. Decision-making details documented in ED Course.  Risk Prescription drug management.   Labs ordered.   Reviewed nursing notes and prior charts for additional history.   Pt requests pain medication - indicates has ride, does not have to drive. Ultram  po.   Labs reviewed/interpreted by me - flu pos. Hgb 12, preg neg.  K sl low. Kcl po.   No nv. Breathing comfortably.   Pt appears stable for d/c.  Rec pcp f/u.  Return precautions provided.          Final Clinical Impression(s) / ED Diagnoses Final diagnoses:  None    Rx / DC Orders ED Discharge Orders     None         Bernard Drivers, MD 01/28/24 1103

## 2024-01-28 NOTE — Discharge Instructions (Addendum)
 It was our pleasure to provide your ER care today - we hope that you feel better.  Drink plenty of fluids/stay well hydrated.  You may try nyquil, mucinex, robitussin or similar over the counter cold and flu medication for symptom relief.   Follow up with primary care doctor in two weeks if symptoms fail to improve/resolve. Your potassium level is mildly low - eat plenty of fruits and vegetables, and follow up with primary care doctor. Your blood pressure is high today - follow up with primary care doctor in the next couple weeks.   Return to ER if worse, new symptoms, increased trouble breathing, severe pain, or other emergency concern.   You were given pain meds in the ER - no driving for the next 6 hours.

## 2024-01-28 NOTE — ED Triage Notes (Signed)
 C/o severe headache and body aches x 2 days. Patient states she had "heavy bleeding" 3 days ago and thought she was having miscarriage and started feeling bad after she stopped bleeding. States no longer bleeding. Denies ABD pain or fevers.

## 2024-05-20 ENCOUNTER — Ambulatory Visit
Admission: EM | Admit: 2024-05-20 | Discharge: 2024-05-20 | Disposition: A | Discharge status: Home / Self Care | Attending: Emergency Medicine | Admitting: Emergency Medicine

## 2024-05-20 DIAGNOSIS — N898 Other specified noninflammatory disorders of vagina: Secondary | ICD-10-CM | POA: Diagnosis present

## 2024-05-20 DIAGNOSIS — R300 Dysuria: Secondary | ICD-10-CM | POA: Insufficient documentation

## 2024-05-20 DIAGNOSIS — R1030 Lower abdominal pain, unspecified: Secondary | ICD-10-CM | POA: Diagnosis present

## 2024-05-20 LAB — POCT URINALYSIS DIP (MANUAL ENTRY)
Bilirubin, UA: NEGATIVE
Glucose, UA: NEGATIVE mg/dL
Ketones, POC UA: NEGATIVE mg/dL
Nitrite, UA: NEGATIVE
Protein Ur, POC: 30 mg/dL — AB
Spec Grav, UA: 1.025 (ref 1.010–1.025)
Urobilinogen, UA: 0.2 U/dL
pH, UA: 6 (ref 5.0–8.0)

## 2024-05-20 LAB — POCT URINE PREGNANCY: Preg Test, Ur: NEGATIVE

## 2024-05-20 MED ORDER — METRONIDAZOLE 500 MG PO TABS
500.0000 mg | ORAL_TABLET | Freq: Two times a day (BID) | ORAL | 0 refills | Status: AC
Start: 2024-05-20 — End: ?

## 2024-05-20 NOTE — ED Provider Notes (Signed)
 Krystal Murray    CSN: [id number] Arrival date & time: 05/20/24  0803      History   Chief Complaint Chief Complaint  Patient presents with   Vaginal Discharge    HPI Krystal Murray is a 33 y.o. female.  Patient presents with 1 day history of vaginal discharge and bladder pressure.  She states this is similar to previous episodes of bacterial vaginitis.  She denies fever, chills, dysuria, hematuria, flank pain, pelvic pain, vomiting, diarrhea, constipation.  No OTC medications taken.  Her medical history includes chlamydia and bacterial vaginosis on 03/12/2024.  She denies current pregnancy or breastfeeding.     The history is provided by the patient and medical records.    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    Medical history non-contributory    Rh negative status during pregnancy 03/25/2023   Rhogam 03/25/23     SGA (small for gestational age) 03/25/2023   Status post C-section 02/07/2018   x2     Supervision of other normal pregnancy, antepartum 10/26/2022              Nursing Staff    Provider      Office Location    [facility]    Dating     05/24/2023, by Last Menstrual Period      [facility] Model    Galerius.Gant ] Traditional  [ ]  Centering  [ ]  Mom-Baby Dyad    Anatomy US      12/29/22, SGA, serial growth scans      Language     English                Flu Vaccine     Declined 11/24/2022    Genetic/Carrier Screen     NIPS:   low risk female   AFP:     Horizon: alpha thal carr   UTI (lower urinary tract infection)     Patient Active Problem List   Diagnosis Date Noted   Alpha thalassemia silent carrier 12/29/2022   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    Past Surgical History:  Procedure Laterality Date   CESAREAN  SECTION N/A 02/07/2018   Procedure: CESAREAN SECTION;  Surgeon: Othelia Blinks, MD;  Location: [facility];  Service: Obstetrics;  Laterality: N/A;   CESAREAN SECTION  01/28/2020   CESAREAN SECTION N/A 05/18/2023   Procedure: CESAREAN SECTION;  Surgeon: Malka Sea, DO;  Location: [facility];  Service: Obstetrics;  Laterality: N/A;    OB History     Gravida  7   Para  5   Term  5   Preterm  0   AB  2   Living  5      SAB  1   IAB  1   Ectopic  0   Multiple  0   Live Births  5            Home Medications    Prior to Admission medications   Medication Sig Start Date End Date Taking? Authorizing Provider  metroNIDAZOLE  (FLAGYL ) 500 MG tablet Take 1 tablet (500 mg total) by mouth 2 (two) times daily. 05/20/24  Yes Wellington Half, NP  medroxyPROGESTERone  (DEPO-PROVERA ) 150 MG/ML injection Inject 1 mL (150  mg total) into the muscle every 3 (three) months. 07/07/23   Denzil Flatten, NP    Family History Family History  Problem Relation Age of Onset   Cancer Maternal Grandmother        breastt   Cancer Maternal Aunt        uterine   Diabetes Maternal Aunt    Hypertension Maternal Aunt    Cancer Cousin        leaukemia    Social History Social History   Tobacco Use   Smoking status: Never    Passive exposure: Never   Smokeless tobacco: Never  Vaping Use   Vaping status: Never Used  Substance Use Topics   Alcohol use: Not Currently    Comment: not since confirmed pregnancy   Drug use: Not Currently    Types: Marijuana    Comment: not since confirmed pregnancy     Allergies   Patient has no known allergies.   Review of Systems Review of Systems  Constitutional:  Negative for chills and fever.  Gastrointestinal:  Positive for abdominal pain. Negative for blood in stool, constipation, diarrhea, nausea and vomiting.  Genitourinary:  Positive for vaginal discharge. Negative for dysuria, flank pain, frequency, hematuria and pelvic pain.   Skin:  Negative for color change and rash.     Physical Exam Triage Vital Signs ED Triage Vitals  Encounter Vitals Group     BP      Systolic BP Percentile      Diastolic BP Percentile      Pulse      Resp      Temp      Temp src      SpO2      Weight      Height      Head Circumference      Peak Flow      Pain Score      Pain Loc      Pain Education      Exclude from Growth Chart    No data found.  Updated Vital Signs BP 118/82   Pulse 76   Temp 97.8 F (36.6 C)   Resp 18   LMP 05/13/2024   SpO2 98%   Visual Acuity Right Eye Distance:   Left Eye Distance:   Bilateral Distance:    Right Eye Near:   Left Eye Near:    Bilateral Near:     Physical Exam Constitutional:      General: She is not in acute distress. HENT:     Mouth/Throat:     Mouth: Mucous membranes are moist.  Cardiovascular:     Rate and Rhythm: Normal rate and regular rhythm.  Pulmonary:     Effort: Pulmonary effort is normal. No respiratory distress.  Abdominal:     General: Bowel sounds are normal.     Palpations: Abdomen is soft.     Tenderness: There is no abdominal tenderness. There is no right CVA tenderness, left CVA tenderness, guarding or rebound.  Genitourinary:    Comments: Patient declines GU exam. Neurological:     Mental Status: She is alert.      UC Treatments / Results  Labs (all labs ordered are listed, but only abnormal results are displayed) Labs Reviewed  POCT URINALYSIS DIP (MANUAL ENTRY) - Abnormal; Notable for the following components:      Result Value   Clarity, UA turbid (*)    Blood, UA trace-lysed (*)    Protein Ur, POC =  30 (*)    Leukocytes, UA Trace (*)    All other components within normal limits  URINE CULTURE  POCT URINE PREGNANCY  CERVICOVAGINAL ANCILLARY ONLY    EKG   Radiology No results found.  Procedures Procedures (including critical care time)  Medications Ordered in UC Medications - No data to display  Initial  Impression / Assessment and Plan / UC Course  I have reviewed the triage vital signs and the nursing notes.  Pertinent labs & imaging results that were available during my care of the patient were reviewed by me and considered in my medical decision making (see chart for details).   Dysuria, vaginal discharge, lower abdominal pain.  Afebrile and vital signs are stable.  Urine culture pending.  Patient reports her current symptoms are similar to previous episodes of bacterial vaginitis.  Treating today with metronidazole .  Instructed patient to abstain from sexual activity until all test results are back and treatment is completed.  Discussed that we will call her if her test results are positive and additional treatment is needed.  Education provided on bacterial vaginitis and abdominal pain.  ED precautions discussed.  Instructed patient to follow-up with her PCP or gynecologist on Monday.  She agrees to plan of care.   Final Clinical Impressions(s) / UC Diagnoses   Final diagnoses:  Dysuria  Vaginal discharge  Lower abdominal pain     Discharge Instructions      Follow up with your primary care provider or gynecologist on Monday.  Go to the emergency department if you have worsening symptoms.   Take the metronidazole  as directed.   Your vaginal tests are pending.  If your test results are positive, we will call you.  You and your sexual partner(s) may require treatment at that time.  Do not have sexual activity for at least 7 days.    A urine culture is pending.  We will call you if treatment is needed.     ED Prescriptions     Medication Sig Dispense Auth. Provider   metroNIDAZOLE  (FLAGYL ) 500 MG tablet Take 1 tablet (500 mg total) by mouth 2 (two) times daily. 14 tablet Wellington Half, NP      PDMP not reviewed this encounter.   Wellington Half, NP 05/20/24 [phone]

## 2024-05-20 NOTE — Discharge Instructions (Addendum)
 Follow up with your primary care provider or gynecologist on Monday.  Go to the emergency department if you have worsening symptoms.   Take the metronidazole  as directed.   Your vaginal tests are pending.  If your test results are positive, we will call you.  You and your sexual partner(s) may require treatment at that time.  Do not have sexual activity for at least 7 days.    A urine culture is pending.  We will call you if treatment is needed.

## 2024-05-20 NOTE — ED Triage Notes (Signed)
 Patient to Urgent Care with complaints of vaginal discharge/ suprapubic pressure.  Symptoms started yesterday.   March-positive for BV/ chlamydia (unsure if she completed treatment).

## 2024-05-22 ENCOUNTER — Ambulatory Visit (HOSPITAL_COMMUNITY): Payer: Self-pay

## 2024-05-22 LAB — CERVICOVAGINAL ANCILLARY ONLY
Bacterial Vaginitis (gardnerella): POSITIVE — AB
Candida Glabrata: NEGATIVE
Candida Vaginitis: POSITIVE — AB
Chlamydia: NEGATIVE
Comment: NEGATIVE
Comment: NEGATIVE
Comment: NEGATIVE
Comment: NEGATIVE
Comment: NEGATIVE
Comment: NORMAL
Neisseria Gonorrhea: NEGATIVE
Trichomonas: NEGATIVE

## 2024-05-22 LAB — URINE CULTURE: Culture: 40000 — AB

## 2024-05-23 MED ORDER — FLUCONAZOLE 150 MG PO TABS: 150.0000 mg | ORAL_TABLET | Freq: Once | ORAL | 0 refills | Status: AC

## 2024-06-30 ENCOUNTER — Ambulatory Visit (INDEPENDENT_AMBULATORY_CARE_PROVIDER_SITE_OTHER): Admitting: Plastic Surgery

## 2024-06-30 ENCOUNTER — Encounter: Payer: Self-pay | Admitting: Plastic Surgery

## 2024-06-30 VITALS — BP 126/87 | HR 78 | Ht 66.0 in | Wt 231.4 lb

## 2024-06-30 DIAGNOSIS — L819 Disorder of pigmentation, unspecified: Secondary | ICD-10-CM | POA: Insufficient documentation

## 2024-06-30 DIAGNOSIS — L816 Other disorders of diminished melanin formation: Secondary | ICD-10-CM

## 2024-06-30 NOTE — Progress Notes (Signed)
 Patient ID: Krystal Murray Kitty, female    DOB: 02-Feb-1991, 33 y.o.   MRN: [id number]   Chief Complaint  Patient presents with   Consult         The patient is a 33 year old female here for evaluation of her lower lip.  The patient states that she is aware she had some anemia in the past.  She picks at her lips quite a bit and thinks that it may be a nervous thing that she does.  She now has hyperpigmentation of the lower lip but she does not believe it is caused from picking.  She would like to see if anything can be done to even out the color.    Review of Systems  Constitutional: Negative.   HENT: Negative.    Eyes: Negative.   Respiratory: Negative.    Cardiovascular: Negative.   Gastrointestinal: Negative.   Endocrine: Negative.   Genitourinary: Negative.   Musculoskeletal: Negative.   Skin:  Positive for color change.    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    Medical history non-contributory    Rh negative status during pregnancy 03/25/2023   Rhogam 03/25/23     SGA (small for gestational age) 03/25/2023   Status post C-section 02/07/2018   x2     Supervision of other normal pregnancy, antepartum 10/26/2022              Nursing Staff    Provider      Office Location    [facility]    Dating     05/24/2023, by Last Menstrual Period      [facility] Model    Galerius.Gant ] Traditional  [ ]  Centering  [ ]  Mom-Baby Dyad    Anatomy US      12/29/22, SGA, serial growth scans      Language     English                Flu Vaccine     Declined 11/24/2022    Genetic/Carrier Screen     NIPS:   low risk female   AFP:     Horizon: alpha thal carr   UTI (lower urinary tract infection)     Past Surgical History:  Procedure Laterality Date   CESAREAN SECTION N/A 02/07/2018   Procedure: CESAREAN SECTION;  Surgeon: Lorence Ozell CROME, MD;  Location: [facility];  Service: Obstetrics;  Laterality: N/A;   CESAREAN SECTION  01/28/2020   CESAREAN SECTION N/A 05/18/2023   Procedure: CESAREAN SECTION;  Surgeon: Barbra Lang PARAS, DO;  Location: [facility];  Service: Obstetrics;  Laterality: N/A;      Current Outpatient Medications:    metroNIDAZOLE  (FLAGYL ) 500 MG tablet, Take 1 tablet (500 mg total) by mouth 2 (two) times daily. (Patient not taking: Reported on 06/30/2024), Disp: 14 tablet, Rfl: 0   Objective:   Vitals:   06/30/24 0859  BP: 126/87  Pulse: 78  SpO2: 97%    Physical Exam Constitutional:      Appearance: Normal appearance.  HENT:     Head: Atraumatic.  Cardiovascular:     Rate and Rhythm: Normal rate.     Pulses: Normal pulses.  Skin:    General: Skin is warm.     Capillary Refill: Capillary refill takes less than 2 seconds.  Neurological:  Mental Status: She is alert and oriented to person, place, and time.  Psychiatric:        Mood and Affect: Mood normal.        Behavior: Behavior normal.        Thought Content: Thought content normal.        Judgment: Judgment normal.     Assessment & Plan:  Hypopigmentation of skin - Plan: Ambulatory referral to Dermatology  I have recommended that the patient see her primary care doc and make sure that her iron , zinc and vitamin B levels are within normal range.  She can also switch her toothpaste to sensitive toothpaste as it may be irritating her lips.  She should use Vaseline when she feels dry areas instead of picking them.  Then she is most certainly welcome to come back and have us  take another look at it.  I am concerned that if the picking does not stop that the pigment will not improve.  Pictures were obtained of the patient and placed in the chart with the patient's or guardian's permission.   Estefana RAMAN Jessah Danser, DO

## 2024-07-04 ENCOUNTER — Encounter: Payer: Self-pay | Admitting: Emergency Medicine

## 2024-07-04 ENCOUNTER — Ambulatory Visit
Admission: EM | Admit: 2024-07-04 | Discharge: 2024-07-04 | Disposition: A | Discharge status: Home / Self Care | Attending: Emergency Medicine | Admitting: Emergency Medicine

## 2024-07-04 DIAGNOSIS — M436 Torticollis: Secondary | ICD-10-CM | POA: Insufficient documentation

## 2024-07-04 DIAGNOSIS — N898 Other specified noninflammatory disorders of vagina: Secondary | ICD-10-CM | POA: Diagnosis not present

## 2024-07-04 MED ORDER — KETOROLAC TROMETHAMINE 30 MG/ML IJ SOLN
30.0000 mg | Freq: Once | INTRAMUSCULAR | Status: AC
  Administered 2024-07-04: 30 mg via INTRAMUSCULAR

## 2024-07-04 MED ORDER — PREDNISONE 10 MG (21) PO TBPK: ORAL_TABLET | Freq: Every day | ORAL | 0 refills | Status: AC

## 2024-07-04 MED ORDER — CYCLOBENZAPRINE HCL 10 MG PO TABS
10.0000 mg | ORAL_TABLET | Freq: Every day | ORAL | 0 refills | Status: AC
Start: 2024-07-04 — End: ?

## 2024-07-04 MED ORDER — FLUCONAZOLE 150 MG PO TABS: 150.0000 mg | ORAL_TABLET | ORAL | 0 refills | Status: AC | PRN

## 2024-07-04 NOTE — ED Triage Notes (Signed)
 Patient complains neck pain  that radiates to back x 2 days. Denies Injury. Also complains yeast infection, and vaginal itching. Rates pain 10/10. Reports she took a muscle relaxer last night with no relief. Does not know the name of the muscle relaxer.

## 2024-07-04 NOTE — Discharge Instructions (Addendum)
 Your pain is most likely caused by irritation to the muscles.  You have been given an injection of Toradol  to help reduce inflammation and help with pain and ideally you start to see relief in the hour  Starting tomorrow 2 prednisone  every morning with food to continue the process above May use Tylenol  or any topical medicines additionally  May use muscle relaxant at bedtime as needed for comfort  You may use heating pad in 15 minute intervals as needed for additional comfort  Begin stretching affected area daily for 10 minutes as tolerated to further loosen muscles   When lying down place pillow underneath neck and back  Can try sleeping without pillow on firm mattress   Practice good posture: head back, shoulders back, chest forward, pelvis back and weight distributed evenly on both legs  If pain persist after recommended treatment or reoccurs if may be beneficial to follow up with orthopedic specialist for evaluation, this doctor specializes in the bones and can manage your symptoms long-term with options such as but not limited to imaging, medications or physical therapy   Today you are being treated prophylactically for yeast.   Take diflucan  150 mg once, if symptoms still present in 3 days then you may take second pill   Yeast infections which are caused by a naturally occurring fungus called candida. Vaginosis is an inflammation of the vagina that can result in discharge, itching and pain. The cause is usually a change in the normal balance of vaginal bacteria or an infection. Vaginosis can also result from reduced estrogen levels after menopause.  Labs pending 2-3 days, you will be contacted if positive for any sti and treatment will be sent to the pharmacy, you will have to return to the clinic if positive for gonorrhea to receive treatment   Please refrain from having sex until labs results, if positive please refrain from having sex until treatment complete and symptoms resolve    If positive for Chlamydia  gonorrhea or trichomoniasis please notify partner or partners so they may tested as well  Moving forward, it is recommended you use some form of protection against the transmission of sti infections  such as condoms or dental dams with each sexual encounter    In addition:   Avoid baths, hot tubs and whirlpool spas.  Don't use scented or harsh soaps, such as those with deodorant or antibacterial action. Avoid irritants. These include scented tampons and pads. Wipe from front to back after using the toilet.  Don't douche. Your vagina doesn't require cleansing other than normal bathing.  Use a  condom. Wear cotton underwear, this fabric helps absorb moisture

## 2024-07-04 NOTE — ED Provider Notes (Signed)
 [name]    CSN: [id number] Arrival date & time: 07/04/24  0944      History   Chief Complaint Chief Complaint  Patient presents with   Back Pain   Torticollis   Vaginitis   Vaginal Itching    HPI Krystal Murray is a 33 y.o. female.   Patient presents for evaluation of posterior neck pain radiating to the upper back and into the shoulder blades beginning 2 days ago.  Started abruptly without precipitating event, injury or trauma.  Has been constant, limiting range of motion making it difficult to turn the neck and pain is exacerbated by all movement.  Associated tingling but denies numbness.  Has attempted use of a muscle relaxant without relief.  Patient presenting with vaginal itching and thick clumpy vaginal discharge with a mild odor present for 2 days.  Associated dysuria.  Sexually active, no known exposures.  Last menstrual period 06/19/2024.  Has not attempted treatment.  Denies urinary frequency, urgency, hematuria, abdominal or flank pain.  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    Medical history non-contributory    Rh negative status during pregnancy 03/25/2023   Rhogam 03/25/23     SGA (small for gestational age) 03/25/2023   Status post C-section 02/07/2018   x2     Supervision of other normal pregnancy, antepartum 10/26/2022              Nursing Staff    Provider      Office Location    [facility]    Dating     05/24/2023, by Last Menstrual Period      [facility] Model    Galerius.Gant ] Traditional  [ ]  Centering  [ ]  Mom-Baby Dyad    Anatomy US      12/29/22, SGA, serial growth scans      Language     English                Flu Vaccine     Declined 11/24/2022    Genetic/Carrier Screen     NIPS:   low risk female   AFP:     Horizon: alpha thal carr   UTI (lower urinary tract infection)     Patient Active Problem List    Diagnosis Date Noted   Hypopigmentation 06/30/2024   Alpha thalassemia silent carrier 12/29/2022   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    Past Surgical History:  Procedure Laterality Date   CESAREAN SECTION N/A 02/07/2018   Procedure: CESAREAN SECTION;  Surgeon: Lorence Ozell CROME, MD;  Location: [facility];  Service: Obstetrics;  Laterality: N/A;   CESAREAN SECTION  01/28/2020   CESAREAN SECTION N/A 05/18/2023   Procedure: CESAREAN SECTION;  Surgeon: Barbra Lang PARAS, DO;  Location: [facility];  Service: Obstetrics;  Laterality: N/A;    OB History     Gravida  7   Para  5   Term  5   Preterm  0   AB  2   Living  5      SAB  1   IAB  1   Ectopic  0   Multiple  0   Live Births  5  Home Medications    Prior to Admission medications   Medication Sig Start Date End Date Taking? Authorizing Provider  cyclobenzaprine  (FLEXERIL ) 10 MG tablet Take 1 tablet (10 mg total) by mouth at bedtime. 07/04/24  Yes Markeem Noreen R, NP  fluconazole  (DIFLUCAN ) 150 MG tablet Take 1 tablet (150 mg total) by mouth every three (3) days as needed for up to 2 doses. 07/04/24  Yes Annamaria Salah R, NP  predniSONE  (STERAPRED UNI-PAK 21 TAB) 10 MG (21) TBPK tablet Take by mouth daily. Take 6 tabs by mouth daily  for 1 days, then 5 tabs for 1 days, then 4 tabs for 1 days, then 3 tabs for 1 days, 2 tabs for 1 days, then 1 tab by mouth daily for 1 days 07/04/24  Yes Anisten Tomassi R, NP  metroNIDAZOLE  (FLAGYL ) 500 MG tablet Take 1 tablet (500 mg total) by mouth 2 (two) times daily. Patient not taking: Reported on 06/30/2024 05/20/24   Corlis Burnard DEL, NP    Family History Family History  Problem Relation Age of Onset   Cancer Maternal Grandmother        breastt   Cancer Maternal Aunt        uterine   Diabetes Maternal Aunt    Hypertension Maternal  Aunt    Cancer Cousin        leaukemia    Social History Social History   Tobacco Use   Smoking status: Never    Passive exposure: Never   Smokeless tobacco: Never  Vaping Use   Vaping status: Never Used  Substance Use Topics   Alcohol use: Not Currently    Comment: not since confirmed pregnancy   Drug use: Not Currently    Types: Marijuana    Comment: not since confirmed pregnancy     Allergies   Patient has no known allergies.   Review of Systems Review of Systems   Physical Exam Triage Vital Signs ED Triage Vitals  Encounter Vitals Group     BP 07/04/24 0956 114/77     Girls Systolic BP Percentile --      Girls Diastolic BP Percentile --      Boys Systolic BP Percentile --      Boys Diastolic BP Percentile --      Pulse Rate 07/04/24 0956 78     Resp 07/04/24 0956 18     Temp 07/04/24 0956 98.5 F (36.9 C)     Temp Source 07/04/24 0956 Oral     SpO2 07/04/24 0956 98 %     Weight --      Height --      Head Circumference --      Peak Flow --      Pain Score 07/04/24 1000 10     Pain Loc --      Pain Education --      Exclude from Growth Chart --    No data found.  Updated Vital Signs BP 114/77 (BP Location: Right Arm)   Pulse 78   Temp 98.5 F (36.9 C) (Oral)   Resp 18   LMP 06/19/2024 (Approximate)   SpO2 98%   Breastfeeding No   Visual Acuity Right Eye Distance:   Left Eye Distance:   Bilateral Distance:    Right Eye Near:   Left Eye Near:    Bilateral Near:     Physical Exam Constitutional:      Appearance: Normal appearance.  Eyes:     Extraocular Movements: Extraocular  movements intact.  Neck:     Comments: Tenderness present to the posterior of the neck extending between the shoulder blades, no rigidity or crepitus, limited range of motion,  unable to complete lateral turns, 2+ carotid pulses bilaterally Pulmonary:     Effort: Pulmonary effort is normal.  Abdominal:     Tenderness: There is no abdominal tenderness. There is  no guarding.  Genitourinary:    Comments: deferred Neurological:     Mental Status: She is alert and oriented to person, place, and time. Mental status is at baseline.      UC Treatments / Results  Labs (all labs ordered are listed, but only abnormal results are displayed) Labs Reviewed  CERVICOVAGINAL ANCILLARY ONLY    EKG   Radiology No results found.  Procedures Procedures (including critical care time)  Medications Ordered in UC Medications  ketorolac  (TORADOL ) 30 MG/ML injection 30 mg (30 mg Intramuscular Given 07/04/24 1021)    Initial Impression / Assessment and Plan / UC Course  I have reviewed the triage vital signs and the nursing notes.  Pertinent labs & imaging results that were available during my care of the patient were reviewed by me and considered in my medical decision making (see chart for details).  Torticollis, vaginal itching  Etiology most likely muscular, denies injury and no spinal tenderness noted on exam therefore imaging deferred, Toradol  IM given and prescribed prednisone  and Flexeril  for home use recommended RICE, heat massage stretching with activity as tolerated and walker referral given to orthopedics  Treating empirically for vaginal yeast, Diflucan  prescribed and discussed administration,STI labs pending will treat per protocol, advised abstinence until lab results, and/or treatment is complete, advised condom use during all sexual encounters moving, may follow-up with urgent care as needed  Final Clinical Impressions(s) / UC Diagnoses   Final diagnoses:  Torticollis  Vaginal itching     Discharge Instructions      Your pain is most likely caused by irritation to the muscles.  You have been given an injection of Toradol  to help reduce inflammation and help with pain and ideally you start to see relief in the hour  Starting tomorrow 2 prednisone  every morning with food to continue the process above May use Tylenol  or any topical  medicines additionally  May use muscle relaxant at bedtime as needed for comfort  You may use heating pad in 15 minute intervals as needed for additional comfort  Begin stretching affected area daily for 10 minutes as tolerated to further loosen muscles   When lying down place pillow underneath neck and back  Can try sleeping without pillow on firm mattress   Practice good posture: head back, shoulders back, chest forward, pelvis back and weight distributed evenly on both legs  If pain persist after recommended treatment or reoccurs if may be beneficial to follow up with orthopedic specialist for evaluation, this doctor specializes in the bones and can manage your symptoms long-term with options such as but not limited to imaging, medications or physical therapy   Today you are being treated prophylactically for yeast.   Take diflucan  150 mg once, if symptoms still present in 3 days then you may take second pill   Yeast infections which are caused by a naturally occurring fungus called candida. Vaginosis is an inflammation of the vagina that can result in discharge, itching and pain. The cause is usually a change in the normal balance of vaginal bacteria or an infection. Vaginosis can also result from reduced estrogen  levels after menopause.  Labs pending 2-3 days, you will be contacted if positive for any sti and treatment will be sent to the pharmacy, you will have to return to the clinic if positive for gonorrhea to receive treatment   Please refrain from having sex until labs results, if positive please refrain from having sex until treatment complete and symptoms resolve   If positive for Chlamydia  gonorrhea or trichomoniasis please notify partner or partners so they may tested as well  Moving forward, it is recommended you use some form of protection against the transmission of sti infections  such as condoms or dental dams with each sexual encounter    In addition:   Avoid  baths, hot tubs and whirlpool spas.  Don't use scented or harsh soaps, such as those with deodorant or antibacterial action. Avoid irritants. These include scented tampons and pads. Wipe from front to back after using the toilet.  Don't douche. Your vagina doesn't require cleansing other than normal bathing.  Use a  condom. Wear cotton underwear, this fabric helps absorb moisture       ED Prescriptions     Medication Sig Dispense Auth. Provider   predniSONE  (STERAPRED UNI-PAK 21 TAB) 10 MG (21) TBPK tablet Take by mouth daily. Take 6 tabs by mouth daily  for 1 days, then 5 tabs for 1 days, then 4 tabs for 1 days, then 3 tabs for 1 days, 2 tabs for 1 days, then 1 tab by mouth daily for 1 days 21 tablet Batu Cassin R, NP   cyclobenzaprine  (FLEXERIL ) 10 MG tablet Take 1 tablet (10 mg total) by mouth at bedtime. 10 tablet Pamala Hayman R, NP   fluconazole  (DIFLUCAN ) 150 MG tablet Take 1 tablet (150 mg total) by mouth every three (3) days as needed for up to 2 doses. 2 tablet Odyssey Vasbinder R, NP      PDMP not reviewed this encounter.   Teresa Shelba SAUNDERS, NP 07/04/24 1026

## 2024-07-05 LAB — CERVICOVAGINAL ANCILLARY ONLY
Bacterial Vaginitis (gardnerella): POSITIVE — AB
Candida Glabrata: NEGATIVE
Candida Vaginitis: POSITIVE — AB
Chlamydia: NEGATIVE
Comment: NEGATIVE
Comment: NEGATIVE
Comment: NEGATIVE
Comment: NEGATIVE
Comment: NEGATIVE
Comment: NORMAL
Neisseria Gonorrhea: NEGATIVE
Trichomonas: POSITIVE — AB

## 2024-07-07 ENCOUNTER — Ambulatory Visit (HOSPITAL_COMMUNITY): Payer: Self-pay

## 2024-07-07 MED ORDER — METRONIDAZOLE 500 MG PO TABS: 500.0000 mg | ORAL_TABLET | Freq: Two times a day (BID) | ORAL | 0 refills | Status: AC

## 2024-07-13 ENCOUNTER — Ambulatory Visit: Admitting: Obstetrics

## 2024-07-20 NOTE — Progress Notes (Deleted)
 Referring Provider Rudy Carlin LABOR, MD [address] [place],  [place] [address]   CC: No chief complaint on file.     Krystal Murray is an 33 y.o. female.  HPI: Patient is a 33 year old who follows up after initial consult with Dr. Lowery 06/30/2024 to discuss hypopigmentation of lower lip.  She reported picking at her lips and attributed to nervous habit.  However, believes that the color change is unrelated.  She inquired about whether or not something could be done to address the color discrepancy.  She was referred to dermatology and encouraged to follow-up with PCP to assess for vitamin deficiency.    No Known Allergies  Outpatient Encounter Medications as of 07/21/2024  Medication Sig   cyclobenzaprine  (FLEXERIL ) 10 MG tablet Take 1 tablet (10 mg total) by mouth at bedtime.   fluconazole  (DIFLUCAN ) 150 MG tablet Take 1 tablet (150 mg total) by mouth every three (3) days as needed for up to 2 doses.   metroNIDAZOLE  (FLAGYL ) 500 MG tablet Take 1 tablet (500 mg total) by mouth 2 (two) times daily. (Patient not taking: Reported on 06/30/2024)   predniSONE  (STERAPRED UNI-PAK 21 TAB) 10 MG (21) TBPK tablet Take by mouth daily. Take 6 tabs by mouth daily  for 1 days, then 5 tabs for 1 days, then 4 tabs for 1 days, then 3 tabs for 1 days, 2 tabs for 1 days, then 1 tab by mouth daily for 1 days   No facility-administered encounter medications on file as of 07/21/2024.     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    Medical history non-contributory    Rh negative status during pregnancy 03/25/2023   Rhogam 03/25/23     SGA (small for gestational age) 03/25/2023   Status post C-section 02/07/2018   x2     Supervision of other normal pregnancy, antepartum 10/26/2022              Nursing Staff    Provider      Office  Location    [facility]    Dating     05/24/2023, by Last Menstrual Period      [facility] Model    Galerius.Gant ] Traditional  [ ]  Centering  [ ]  Mom-Baby Dyad    Anatomy US      12/29/22, SGA, serial growth scans      Language     English                Flu Vaccine     Declined 11/24/2022    Genetic/Carrier Screen     NIPS:   low risk female   AFP:     Horizon: alpha thal carr   UTI (lower urinary tract infection)     Past Surgical History:  Procedure Laterality Date   CESAREAN SECTION N/A 02/07/2018   Procedure: CESAREAN SECTION;  Surgeon: Lorence Ozell CROME, MD;  Location: [facility];  Service: Obstetrics;  Laterality: N/A;   CESAREAN SECTION  01/28/2020   CESAREAN SECTION N/A 05/18/2023   Procedure: CESAREAN SECTION;  Surgeon: Barbra Lang PARAS, DO;  Location: [facility];  Service: Obstetrics;  Laterality: N/A;    Family History  Problem Relation Age of Onset   Cancer Maternal Grandmother        breastt   Cancer Maternal Aunt  uterine   Diabetes Maternal Aunt    Hypertension Maternal Aunt    Cancer Cousin        leaukemia    Social History   Social History Narrative   ** Merged History Encounter **         Review of Systems General: Denies fevers or chills Cardio: Denies chest pain Pulmonary: Denies difficulty breathing  Physical Exam    07/04/2024    9:56 AM 06/30/2024    8:59 AM 05/20/2024    8:27 AM  Vitals with BMI  Height  5' 6   Weight  231 lbs 6 oz   BMI  37.37   Systolic 114 126 881  Diastolic 77 87 82  Pulse 78 78 76    General:  No acute distress, nontoxic appearing  Respiratory: No increased work of breathing Neuro: Alert and oriented Psychiatric: Normal mood and affect   Assessment/Plan ***  Honora Seip PA-C 07/20/2024, 2:17 PM

## 2024-07-21 ENCOUNTER — Ambulatory Visit: Admitting: Physician Assistant

## 2024-07-27 ENCOUNTER — Ambulatory Visit: Admitting: Obstetrics

## 2024-07-28 ENCOUNTER — Ambulatory Visit
Admission: EM | Admit: 2024-07-28 | Discharge: 2024-07-28 | Disposition: A | Discharge status: Home / Self Care | Attending: Emergency Medicine | Admitting: Emergency Medicine

## 2024-07-28 DIAGNOSIS — R051 Acute cough: Secondary | ICD-10-CM

## 2024-07-28 DIAGNOSIS — U071 COVID-19: Secondary | ICD-10-CM

## 2024-07-28 LAB — POC SOFIA SARS ANTIGEN FIA: SARS Coronavirus 2 Ag: POSITIVE — AB

## 2024-07-28 MED ORDER — PROMETHAZINE-DM 6.25-15 MG/5ML PO SYRP: 5.0000 mL | ORAL_SOLUTION | Freq: Four times a day (QID) | ORAL | 0 refills | Status: AC | PRN

## 2024-07-28 MED ORDER — IBUPROFEN 600 MG PO TABS
600.0000 mg | ORAL_TABLET | Freq: Once | ORAL | Status: AC
  Administered 2024-07-28: 600 mg via ORAL

## 2024-07-28 MED ORDER — IBUPROFEN 600 MG PO TABS: 600.0000 mg | ORAL_TABLET | Freq: Four times a day (QID) | ORAL | 0 refills | Status: AC | PRN

## 2024-07-28 NOTE — ED Triage Notes (Signed)
 Patient to Urgent Care with complaints of body aches/ cough/ sore throat/ nasal congestion/ hot flashes. Denies any fevers.   Symptoms started yesterday morning.  Taking tylenol .

## 2024-07-28 NOTE — Discharge Instructions (Addendum)
 COVID test is positive.    Take the ibuprofen  as needed for fever or discomfort.  Take the Promethazine  DM as needed for cough.  Do not drive, operate machinery, drink alcohol, or perform dangerous activities while taking promethazine  as it may cause drowsiness.    Follow up with your primary care provider on Monday.  Go to the emergency department if you have worsening symptoms.

## 2024-07-28 NOTE — ED Provider Notes (Signed)
 [name]    CSN: [id number] Arrival date & time: 07/28/24  1848      History   Chief Complaint Chief Complaint  Patient presents with   Nasal Congestion   Generalized Body Aches   Cough    HPI Michelene LAURIS KEEPERS is a 33 y.o. female.  Patient presents with 1 day history of bodyaches, sore throat, congestion, cough.  She has been treating her symptoms with Tylenol  and ibuprofen .  No fever, vomiting, diarrhea.  The history is provided by the patient and medical records.    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    Medical history non-contributory    Rh negative status during pregnancy 03/25/2023   Rhogam 03/25/23     SGA (small for gestational age) 03/25/2023   Status post C-section 02/07/2018   x2     Supervision of other normal pregnancy, antepartum 10/26/2022              Nursing Staff    Provider      Office Location    [facility]    Dating     05/24/2023, by Last Menstrual Period      [facility] Model    Galerius.Gant ] Traditional  [ ]  Centering  [ ]  Mom-Baby Dyad    Anatomy US      12/29/22, SGA, serial growth scans      Language     English                Flu Vaccine     Declined 11/24/2022    Genetic/Carrier Screen     NIPS:   low risk female   AFP:     Horizon: alpha thal carr   UTI (lower urinary tract infection)     Patient Active Problem List   Diagnosis Date Noted   Hypopigmentation 06/30/2024   Alpha thalassemia silent carrier 12/29/2022   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    Past Surgical History:  Procedure Laterality Date   CESAREAN SECTION N/A 02/07/2018   Procedure: CESAREAN SECTION;  Surgeon: Lorence Ozell CROME, MD;  Location: [facility];  Service: Obstetrics;  Laterality: N/A;   CESAREAN  SECTION  01/28/2020   CESAREAN SECTION N/A 05/18/2023   Procedure: CESAREAN SECTION;  Surgeon: Barbra Lang PARAS, DO;  Location: [facility];  Service: Obstetrics;  Laterality: N/A;    OB History     Gravida  7   Para  5   Term  5   Preterm  0   AB  2   Living  5      SAB  1   IAB  1   Ectopic  0   Multiple  0   Live Births  5            Home Medications    Prior to Admission medications   Medication Sig Start Date End Date Taking? Authorizing Provider  ibuprofen  (ADVIL ) 600 MG tablet Take 1 tablet (600 mg total) by mouth every 6 (six) hours as needed. 07/28/24  Yes Corlis Burnard DEL, NP  promethazine -dextromethorphan (PROMETHAZINE -DM) 6.25-15 MG/5ML syrup Take 5 mLs by mouth 4 (four) times daily as needed. 07/28/24  Yes Corlis Burnard DEL, NP  cyclobenzaprine  (FLEXERIL ) 10 MG tablet Take 1  tablet (10 mg total) by mouth at bedtime. Patient not taking: Reported on 07/28/2024 07/04/24   Teresa Shelba SAUNDERS, NP  fluconazole  (DIFLUCAN ) 150 MG tablet Take 1 tablet (150 mg total) by mouth every three (3) days as needed for up to 2 doses. Patient not taking: Reported on 07/28/2024 07/04/24   Teresa Shelba SAUNDERS, NP  metroNIDAZOLE  (FLAGYL ) 500 MG tablet Take 1 tablet (500 mg total) by mouth 2 (two) times daily. Patient not taking: Reported on 06/30/2024 05/20/24   Corlis Burnard DEL, NP  predniSONE  (STERAPRED UNI-PAK 21 TAB) 10 MG (21) TBPK tablet Take by mouth daily. Take 6 tabs by mouth daily  for 1 days, then 5 tabs for 1 days, then 4 tabs for 1 days, then 3 tabs for 1 days, 2 tabs for 1 days, then 1 tab by mouth daily for 1 days Patient not taking: Reported on 07/28/2024 07/04/24   Teresa Shelba SAUNDERS, NP    Family History Family History  Problem Relation Age of Onset   Cancer Maternal Grandmother        breastt   Cancer Maternal Aunt        uterine   Diabetes Maternal Aunt    Hypertension Maternal Aunt    Cancer Cousin        leaukemia    Social History Social History   Tobacco Use    Smoking status: Never    Passive exposure: Never   Smokeless tobacco: Never  Vaping Use   Vaping status: Never Used  Substance Use Topics   Alcohol use: Not Currently    Comment: not since confirmed pregnancy   Drug use: Not Currently    Types: Marijuana    Comment: not since confirmed pregnancy     Allergies   Patient has no known allergies.   Review of Systems Review of Systems  Constitutional:  Negative for chills and fever.  HENT:  Positive for congestion, postnasal drip, rhinorrhea and sore throat.   Respiratory:  Positive for cough. Negative for shortness of breath.   Gastrointestinal:  Negative for diarrhea and vomiting.     Physical Exam Triage Vital Signs ED Triage Vitals  Encounter Vitals Group     BP 07/28/24 1923 114/79     Girls Systolic BP Percentile --      Girls Diastolic BP Percentile --      Boys Systolic BP Percentile --      Boys Diastolic BP Percentile --      Pulse Rate 07/28/24 1923 98     Resp 07/28/24 1923 18     Temp 07/28/24 1923 99.6 F (37.6 C)     Temp src --      SpO2 07/28/24 1923 98 %     Weight --      Height --      Head Circumference --      Peak Flow --      Pain Score 07/28/24 1937 10     Pain Loc --      Pain Education --      Exclude from Growth Chart --    No data found.  Updated Vital Signs BP 114/79   Pulse 98   Temp 99.6 F (37.6 C)   Resp 18   LMP 07/23/2024 (Approximate)   SpO2 98%   Visual Acuity Right Eye Distance:   Left Eye Distance:   Bilateral Distance:    Right Eye Near:   Left Eye Near:    Bilateral Near:  Physical Exam Constitutional:      General: She is not in acute distress. HENT:     Right Ear: Tympanic membrane normal.     Left Ear: Tympanic membrane normal.     Nose: Congestion and rhinorrhea present.     Mouth/Throat:     Mouth: Mucous membranes are moist.     Pharynx: Oropharynx is clear.  Cardiovascular:     Rate and Rhythm: Normal rate and regular rhythm.     Heart  sounds: Normal heart sounds.  Pulmonary:     Effort: Pulmonary effort is normal. No respiratory distress.     Breath sounds: Normal breath sounds.  Neurological:     Mental Status: She is alert.      UC Treatments / Results  Labs (all labs ordered are listed, but only abnormal results are displayed) Labs Reviewed  POC SOFIA SARS ANTIGEN FIA - Abnormal; Notable for the following components:      Result Value   SARS Coronavirus 2 Ag Positive (*)    All other components within normal limits    EKG   Radiology No results found.  Procedures Procedures (including critical care time)  Medications Ordered in UC Medications  ibuprofen  (ADVIL ) tablet 600 mg (600 mg Oral Given 07/28/24 1957)    Initial Impression / Assessment and Plan / UC Course  I have reviewed the triage vital signs and the nursing notes.  Pertinent labs & imaging results that were available during my care of the patient were reviewed by me and considered in my medical decision making (see chart for details).    COVID-19, acute cough.  Rapid COVID positive.  Treating with ibuprofen  as needed and Promethazine  DM as needed.  Precautions for drowsiness with promethazine  discussed.  Education provided on COVID-19.  ED precautions discussed.  Instructed patient to follow-up with her PCP on Monday.  She agrees to plan of care.  Final Clinical Impressions(s) / UC Diagnoses   Final diagnoses:  Acute cough  COVID-19     Discharge Instructions      COVID test is positive.    Take the ibuprofen  as needed for fever or discomfort.  Take the Promethazine  DM as needed for cough.  Do not drive, operate machinery, drink alcohol, or perform dangerous activities while taking promethazine  as it may cause drowsiness.    Follow up with your primary care provider on Monday.  Go to the emergency department if you have worsening symptoms.         ED Prescriptions     Medication Sig Dispense Auth. Provider   ibuprofen   (ADVIL ) 600 MG tablet Take 1 tablet (600 mg total) by mouth every 6 (six) hours as needed. 30 tablet Corlis Burnard DEL, NP   promethazine -dextromethorphan (PROMETHAZINE -DM) 6.25-15 MG/5ML syrup Take 5 mLs by mouth 4 (four) times daily as needed. 118 mL Corlis Burnard DEL, NP      PDMP not reviewed this encounter.   Corlis Burnard DEL, NP 07/28/24 2013

## 2024-08-08 ENCOUNTER — Ambulatory Visit: Admitting: Obstetrics

## 2024-08-21 ENCOUNTER — Other Ambulatory Visit: Payer: Self-pay

## 2024-08-21 ENCOUNTER — Emergency Department

## 2024-08-21 ENCOUNTER — Emergency Department
Admission: EM | Admit: 2024-08-21 | Discharge: 2024-08-21 | Disposition: A | Discharge status: Home / Self Care | Attending: Emergency Medicine | Admitting: Emergency Medicine

## 2024-08-21 DIAGNOSIS — N760 Acute vaginitis: Secondary | ICD-10-CM | POA: Diagnosis not present

## 2024-08-21 DIAGNOSIS — R109 Unspecified abdominal pain: Secondary | ICD-10-CM | POA: Diagnosis present

## 2024-08-21 DIAGNOSIS — U071 COVID-19: Secondary | ICD-10-CM | POA: Insufficient documentation

## 2024-08-21 DIAGNOSIS — B9689 Other specified bacterial agents as the cause of diseases classified elsewhere: Secondary | ICD-10-CM | POA: Diagnosis not present

## 2024-08-21 LAB — URINALYSIS, ROUTINE W REFLEX MICROSCOPIC
Bilirubin Urine: NEGATIVE
Glucose, UA: NEGATIVE mg/dL
Hgb urine dipstick: NEGATIVE
Ketones, ur: NEGATIVE mg/dL
Nitrite: NEGATIVE
Protein, ur: NEGATIVE mg/dL
Specific Gravity, Urine: 1.016 (ref 1.005–1.030)
pH: 6 (ref 5.0–8.0)

## 2024-08-21 LAB — CBC
HCT: 36.7 % (ref 36.0–46.0)
Hemoglobin: 11.4 g/dL — ABNORMAL LOW (ref 12.0–15.0)
MCH: 26.3 pg (ref 26.0–34.0)
MCHC: 31.1 g/dL (ref 30.0–36.0)
MCV: 84.6 fL (ref 80.0–100.0)
Platelets: 175 K/uL (ref 150–400)
RBC: 4.34 MIL/uL (ref 3.87–5.11)
RDW: 13.6 % (ref 11.5–15.5)
WBC: 4.1 K/uL (ref 4.0–10.5)
nRBC: 0 % (ref 0.0–0.2)

## 2024-08-21 LAB — CHLAMYDIA/NGC RT PCR (ARMC ONLY)
Chlamydia Tr: NOT DETECTED
N gonorrhoeae: NOT DETECTED

## 2024-08-21 LAB — COMPREHENSIVE METABOLIC PANEL WITH GFR
ALT: 11 U/L (ref 0–44)
AST: 16 U/L (ref 15–41)
Albumin: 3.9 g/dL (ref 3.5–5.0)
Alkaline Phosphatase: 39 U/L (ref 38–126)
Anion gap: 10 (ref 5–15)
BUN: 9 mg/dL (ref 6–20)
CO2: 23 mmol/L (ref 22–32)
Calcium: 9.1 mg/dL (ref 8.9–10.3)
Chloride: 103 mmol/L (ref 98–111)
Creatinine, Ser: 0.79 mg/dL (ref 0.44–1.00)
GFR, Estimated: >60 mL/min (ref >60)
Glucose, Bld: 102 mg/dL — ABNORMAL HIGH (ref 70–99)
Potassium: 3.5 mmol/L (ref 3.5–5.1)
Sodium: 136 mmol/L (ref 135–145)
Total Bilirubin: 0.8 mg/dL (ref 0.0–1.2)
Total Protein: 7.9 g/dL (ref 6.5–8.1)

## 2024-08-21 LAB — RESP PANEL BY RT-PCR (RSV, FLU A&B, COVID)  RVPGX2
Influenza A by PCR: NEGATIVE
Influenza B by PCR: NEGATIVE
Resp Syncytial Virus by PCR: NEGATIVE
SARS Coronavirus 2 by RT PCR: POSITIVE — AB

## 2024-08-21 LAB — POC URINE PREG, ED: Preg Test, Ur: NEGATIVE

## 2024-08-21 LAB — WET PREP, GENITAL
Sperm: NONE SEEN
Trich, Wet Prep: NONE SEEN
WBC, Wet Prep HPF POC: <10 (ref <10)
Yeast Wet Prep HPF POC: NONE SEEN

## 2024-08-21 LAB — LIPASE, BLOOD: Lipase: 26 U/L (ref 11–51)

## 2024-08-21 MED ORDER — MORPHINE SULFATE (PF) 4 MG/ML IV SOLN
4.0000 mg | Freq: Once | INTRAVENOUS | Status: AC
  Administered 2024-08-21: 4 mg via INTRAVENOUS
  Filled 2024-08-21: qty 1

## 2024-08-21 MED ORDER — LACTATED RINGERS IV BOLUS
1000.0000 mL | Freq: Once | INTRAVENOUS | Status: AC
  Administered 2024-08-21: 1000 mL via INTRAVENOUS

## 2024-08-21 MED ORDER — ACETAMINOPHEN 500 MG PO TABS
1000.0000 mg | ORAL_TABLET | Freq: Once | ORAL | Status: AC
  Administered 2024-08-21: 1000 mg via ORAL
  Filled 2024-08-21: qty 2

## 2024-08-21 MED ORDER — IOHEXOL 350 MG/ML SOLN
100.0000 mL | Freq: Once | INTRAVENOUS | Status: AC | PRN
  Administered 2024-08-21: 100 mL via INTRAVENOUS

## 2024-08-21 MED ORDER — METRONIDAZOLE 500 MG PO TABS: 500.0000 mg | ORAL_TABLET | Freq: Two times a day (BID) | ORAL | 0 refills | Status: AC

## 2024-08-21 MED ORDER — METRONIDAZOLE 500 MG PO TABS
500.0000 mg | ORAL_TABLET | Freq: Once | ORAL | Status: AC
  Administered 2024-08-21: 500 mg via ORAL
  Filled 2024-08-21: qty 1

## 2024-08-21 MED ORDER — ONDANSETRON HCL 4 MG/2ML IJ SOLN
4.0000 mg | Freq: Once | INTRAMUSCULAR | Status: AC
  Administered 2024-08-21: 4 mg via INTRAVENOUS
  Filled 2024-08-21: qty 2

## 2024-08-21 NOTE — ED Triage Notes (Signed)
 Abd this time AM. Pt sts that there is something wrong with her kidneys.

## 2024-08-21 NOTE — Discharge Instructions (Signed)
 Please take Tylenol  and ibuprofen /Advil  for your pain.  It is safe to take them together, or to alternate them every few hours.  Take up to 1000mg  of Tylenol  at a time, up to 4 times per day.  Do not take more than 4000 mg of Tylenol  in 24 hours.  For ibuprofen , take 400-600 mg, 3 - 4 times per day.  Flagyl /metronidazole  antibiotic for BV for the next week

## 2024-08-21 NOTE — ED Provider Notes (Signed)
 [facility] Provider Note    Event Date/Time   First MD Initiated Contact with Patient 08/21/24 1921     (approximate)   History   Abdominal Pain   HPI  Krystal Murray is a 33 y.o. female who presents to the ED for evaluation of Abdominal Pain   Patient presents for evaluation of right sided abdominal pain.  Reports 2-3 days of a URI, congestion.  Did not know that she had a fever, no nausea, emesis, stool or urinary changes.  Does report change of the consistency of her vaginal discharge, thicker than normal.   Physical Exam   Triage Vital Signs: ED Triage Vitals  Encounter Vitals Group     BP 08/21/24 1855 121/70     Girls Systolic BP Percentile --      Girls Diastolic BP Percentile --      Boys Systolic BP Percentile --      Boys Diastolic BP Percentile --      Pulse Rate 08/21/24 1855 (!) 114     Resp 08/21/24 1855 19     Temp 08/21/24 1855 (!) 100.7 F (38.2 C)     Temp Source 08/21/24 1855 Oral     SpO2 08/21/24 1855 100 %     Weight 08/21/24 1856 230 lb (104.3 kg)     Height 08/21/24 1856 5' 6 (1.676 m)     Head Circumference --      Peak Flow --      Pain Score 08/21/24 1855 8     Pain Loc --      Pain Education --      Exclude from Growth Chart --     Most recent vital signs: Vitals:   08/21/24 2030 08/21/24 2056  BP: 121/70 114/66  Pulse: 78 72  Resp:  18  Temp:  98.4 F (36.9 C)  SpO2: 98% 100%    General: Awake, no distress.  Cough and congestion CV:  Good peripheral perfusion.  Resp:  Normal effort.  Abd:  No distention.  Mild RLQ tenderness without peritoneal features.  Abdomen is otherwise benign. MSK:  No deformity noted.  Neuro:  No focal deficits appreciated. Other:     ED Results / Procedures / Treatments   Labs (all labs ordered are listed, but only abnormal results are displayed) Labs Reviewed  RESP PANEL BY RT-PCR (RSV, FLU A&B, COVID)  RVPGX2 - Abnormal; Notable for the following components:       Result Value   SARS Coronavirus 2 by RT PCR POSITIVE (*)    All other components within normal limits  WET PREP, GENITAL - Abnormal; Notable for the following components:   Clue Cells Wet Prep HPF POC PRESENT (*)    All other components within normal limits  COMPREHENSIVE METABOLIC PANEL WITH GFR - Abnormal; Notable for the following components:   Glucose, Bld 102 (*)    All other components within normal limits  CBC - Abnormal; Notable for the following components:   Hemoglobin 11.4 (*)    All other components within normal limits  URINALYSIS, ROUTINE W REFLEX MICROSCOPIC - Abnormal; Notable for the following components:   Color, Urine YELLOW (*)    APPearance CLOUDY (*)    Leukocytes,Ua TRACE (*)    Bacteria, UA RARE (*)    All other components within normal limits  CHLAMYDIA/NGC RT PCR ([facility] ONLY)            CHLAMYDIA/NGC RT PCR ([facility] ONLY)  LIPASE, BLOOD  POC URINE PREG, ED    EKG   RADIOLOGY CXR interpreted by me without evidence of acute cardiopulmonary pathology. CT abdomen/pelvis interpreted by me without evidence of acute pathology.  Official radiology report(s): CT ABDOMEN PELVIS W CONTRAST Result Date: 08/21/2024 CLINICAL DATA:  Right lower quadrant pain.  Fever. EXAM: CT ABDOMEN AND PELVIS WITH CONTRAST TECHNIQUE: Multidetector CT imaging of the abdomen and pelvis was performed using the standard protocol following bolus administration of intravenous contrast. RADIATION DOSE REDUCTION: This exam was performed according to the departmental dose-optimization program which includes automated exposure control, adjustment of the mA and/or kV according to patient size and/or use of iterative reconstruction technique. CONTRAST:  OMNIPAQUE  IOHEXOL  350 MG/ML SOLN COMPARISON:  CT 07/22/2022 FINDINGS: Lower chest: Clear lung bases. Hepatobiliary: No suspicious liver lesion. Minimal focal fatty infiltration adjacent to the falc gallbladder fossa. Decompressed  gallbladder without gallstone. No pericholecystic inflammation. No biliary dilatation. Iform Pancreas: Unremarkable. No pancreatic ductal dilatation or surrounding inflammatory changes. Spleen: Elongated but overall normal in size.  No focal abnormality. Adrenals/Urinary Tract: No adrenal nodule. No hydronephrosis, renal calculi, or evidence of renal inflammation. No suspicious renal lesion. Urinary bladder is near completely empty and not well assessed. Stomach/Bowel: The appendix is upper normal in thickness at 6 mm, unchanged from prior exam. There is a small amount of intraluminal fluid. Small focus of appendiceal air seen proximally (normal). No hyperemia, periappendiceal fat stranding or inflammation. No appendicolith. Scattered fluid throughout nondilated small bowel. No obstruction or inflammation. Small to moderate colonic stool burden. Vascular/Lymphatic: Normal caliber abdominal aorta. The portal vein is patent. No enlarged lymph nodes in the abdomen or pelvis. Reproductive: Simple appearing 3 cm right ovarian cyst. The uterus is anteverted. Other: Trace free fluid in the pelvis. No abdominal ascites. No free air. Small fat containing umbilical hernia. Musculoskeletal: There are no acute or suspicious osseous abnormalities. IMPRESSION: 1. Minimal fluid in the appendix nonspecific, but no inflammation or findings to suggest appendicitis. 2. Simple appearing 3 cm right ovarian cyst. No follow-up imaging is recommended. Reference: JACR 2020 Feb;17(2):248-254 3. Small fat containing umbilical hernia. Electronically Signed   By: Andrea Gasman M.D.   On: 08/21/2024 20:54   DG Chest 2 View Result Date: 08/21/2024 CLINICAL DATA:  Fever and cough EXAM: CHEST - 2 VIEW COMPARISON:  Chest x-ray 07/23/2023 FINDINGS: The heart size and mediastinal contours are within normal limits. Both lungs are clear. The visualized skeletal structures are unremarkable. IMPRESSION: No active cardiopulmonary disease.  Electronically Signed   By: Greig Pique M.D.   On: 08/21/2024 20:43    PROCEDURES and INTERVENTIONS:  Procedures  Medications  lactated ringers  bolus 1,000 mL (1,000 mLs Intravenous New Bag/Given 08/21/24 1946)  morphine  (PF) 4 MG/ML injection 4 mg (4 mg Intravenous Given 08/21/24 1942)  ondansetron  (ZOFRAN ) injection 4 mg (4 mg Intravenous Given 08/21/24 1940)  acetaminophen  (TYLENOL ) tablet 1,000 mg (1,000 mg Oral Given 08/21/24 1949)  iohexol  (OMNIPAQUE ) 350 MG/ML injection 100 mL (100 mLs Intravenous Contrast Given 08/21/24 2000)  metroNIDAZOLE  (FLAGYL ) tablet 500 mg (500 mg Oral Given 08/21/24 2120)     IMPRESSION / MDM / ASSESSMENT AND PLAN / ED COURSE  I reviewed the triage vital signs and the nursing notes.  Differential diagnosis includes, but is not limited to, acute appendicitis, sepsis, UTI or pyelonephritis, PID, BV, viral syndrome   {Patient presents with symptoms of an acute illness or injury that is potentially life-threatening.  Patient presents with respiratory congestion and abdominal  cramping with evidence of BV and COVID-19 suitable for outpatient management.  Minimal abdominal tenderness, generally benign exam.  Mostly complaining of her URI congestion.  Reassuring imaging and blood work.  Normal metabolic panel, lipase and CBC.  Urine without infectious features.  Started on Flagyl  for BV, doubt PID.  Discussed COVID management at home and ED return precautions.  Patient suitable for outpatient management.  Clinical Course as of 08/21/24 2143  Mon Aug 21, 2024  2135 Reassessed, feeling better.  We discussed COVID-positive and signs of BV. [DS]    Clinical Course User Index [DS] Claudene Rover, MD     FINAL CLINICAL IMPRESSION(S) / ED DIAGNOSES   Final diagnoses:  COVID-19  BV (bacterial vaginosis)     Rx / DC Orders   ED Discharge Orders          Ordered    metroNIDAZOLE  (FLAGYL ) 500 MG tablet  2 times daily        08/21/24 2130             Note:   This document was prepared using Dragon voice recognition software and may include unintentional dictation errors.   Claudene Rover, MD 08/21/24 [phone]

## 2024-09-07 ENCOUNTER — Other Ambulatory Visit: Payer: Self-pay

## 2024-09-07 ENCOUNTER — Encounter: Payer: Self-pay | Admitting: Intensive Care

## 2024-09-07 ENCOUNTER — Emergency Department
Admission: EM | Admit: 2024-09-07 | Discharge: 2024-09-07 | Disposition: A | Discharge status: Home / Self Care | Attending: Emergency Medicine | Admitting: Emergency Medicine

## 2024-09-07 DIAGNOSIS — N939 Abnormal uterine and vaginal bleeding, unspecified: Secondary | ICD-10-CM | POA: Insufficient documentation

## 2024-09-07 LAB — COMPREHENSIVE METABOLIC PANEL WITH GFR
ALT: 10 U/L (ref 0–44)
AST: 19 U/L (ref 15–41)
Albumin: 3.8 g/dL (ref 3.5–5.0)
Alkaline Phosphatase: 34 U/L — ABNORMAL LOW (ref 38–126)
Anion gap: 8 (ref 5–15)
BUN: 12 mg/dL (ref 6–20)
CO2: 23 mmol/L (ref 22–32)
Calcium: 8.7 mg/dL — ABNORMAL LOW (ref 8.9–10.3)
Chloride: 110 mmol/L (ref 98–111)
Creatinine, Ser: 0.9 mg/dL (ref 0.44–1.00)
GFR, Estimated: >60 mL/min (ref >60)
Glucose, Bld: 88 mg/dL (ref 70–99)
Potassium: 3.7 mmol/L (ref 3.5–5.1)
Sodium: 141 mmol/L (ref 135–145)
Total Bilirubin: 0.8 mg/dL (ref 0.0–1.2)
Total Protein: 7.3 g/dL (ref 6.5–8.1)

## 2024-09-07 LAB — CBC
HCT: 35.4 % — ABNORMAL LOW (ref 36.0–46.0)
Hemoglobin: 11.1 g/dL — ABNORMAL LOW (ref 12.0–15.0)
MCH: 26.9 pg (ref 26.0–34.0)
MCHC: 31.4 g/dL (ref 30.0–36.0)
MCV: 85.7 fL (ref 80.0–100.0)
Platelets: 260 K/uL (ref 150–400)
RBC: 4.13 MIL/uL (ref 3.87–5.11)
RDW: 14.1 % (ref 11.5–15.5)
WBC: 5.6 K/uL (ref 4.0–10.5)
nRBC: 0 % (ref 0.0–0.2)

## 2024-09-07 LAB — URINALYSIS, ROUTINE W REFLEX MICROSCOPIC
Bilirubin Urine: NEGATIVE
Glucose, UA: NEGATIVE mg/dL
Ketones, ur: NEGATIVE mg/dL
Leukocytes,Ua: NEGATIVE
Nitrite: NEGATIVE
Protein, ur: NEGATIVE mg/dL
Specific Gravity, Urine: 1.029 (ref 1.005–1.030)
pH: 5 (ref 5.0–8.0)

## 2024-09-07 LAB — LIPASE, BLOOD: Lipase: 27 U/L (ref 11–51)

## 2024-09-07 LAB — PREGNANCY, URINE: Preg Test, Ur: NEGATIVE

## 2024-09-07 MED ORDER — DICYCLOMINE HCL 10 MG PO CAPS: 10.0000 mg | ORAL_CAPSULE | Freq: Three times a day (TID) | ORAL | 0 refills | Status: DC | PRN

## 2024-09-07 MED ORDER — DICYCLOMINE HCL 10 MG PO CAPS: 10.0000 mg | ORAL_CAPSULE | Freq: Three times a day (TID) | ORAL | 0 refills | Status: AC | PRN

## 2024-09-07 MED ORDER — NORETHINDRONE-ETH ESTRADIOL 0.5-35 MG-MCG PO TABS: 1.0000 | ORAL_TABLET | Freq: Every day | ORAL | 6 refills | Status: DC

## 2024-09-07 MED ORDER — NORETHINDRONE-ETH ESTRADIOL 0.5-35 MG-MCG PO TABS: 1.0000 | ORAL_TABLET | Freq: Every day | ORAL | 6 refills | Status: AC

## 2024-09-07 NOTE — ED Triage Notes (Signed)
 Reports she started her period 08/21/24 and still bleeding. Reports flow is heavy and bright red.

## 2024-09-07 NOTE — ED Provider Notes (Signed)
 [facility] Provider Note    Event Date/Time   First MD Initiated Contact with Patient 09/07/24 1557     (approximate)   History   Vaginal Bleeding and Abdominal Pain   HPI  Krystal Murray is a 33 y.o. female  who presents to the emergency department today because of concern for vaginal bleeding and abdominal pain. The patient states that her period has been present for roughly 3 weeks.  She has had irregular periods since the birth of her child earlier this year.  This has been accompanied by some abdominal discomfort.  Patient denies any fevers or chills.      Physical Exam   Triage Vital Signs: ED Triage Vitals  Encounter Vitals Group     BP 09/07/24 1503 125/79     Girls Systolic BP Percentile --      Girls Diastolic BP Percentile --      Boys Systolic BP Percentile --      Boys Diastolic BP Percentile --      Pulse Rate 09/07/24 1503 62     Resp 09/07/24 1503 16     Temp 09/07/24 1503 98.6 F (37 C)     Temp Source 09/07/24 1503 Oral     SpO2 09/07/24 1503 100 %     Weight 09/07/24 1501 240 lb (108.9 kg)     Height 09/07/24 1501 6' (1.829 m)     Head Circumference --      Peak Flow --      Pain Score 09/07/24 1501 8     Pain Loc --      Pain Education --      Exclude from Growth Chart --     Most recent vital signs: Vitals:   09/07/24 1503  BP: 125/79  Pulse: 62  Resp: 16  Temp: 98.6 F (37 C)  SpO2: 100%   General: Awake, alert, oriented. CV:  Good peripheral perfusion. Regular rate and rhythm. Resp:  Normal effort. Lungs clear. Abd:  No distention. Non tender.   ED Results / Procedures / Treatments   Labs (all labs ordered are listed, but only abnormal results are displayed) Labs Reviewed  COMPREHENSIVE METABOLIC PANEL WITH GFR - Abnormal; Notable for the following components:      Result Value   Calcium  8.7 (*)    Alkaline Phosphatase 34 (*)    All other components within normal limits  CBC - Abnormal;  Notable for the following components:   Hemoglobin 11.1 (*)    HCT 35.4 (*)    All other components within normal limits  URINALYSIS, ROUTINE W REFLEX MICROSCOPIC - Abnormal; Notable for the following components:   Color, Urine YELLOW (*)    APPearance HAZY (*)    Hgb urine dipstick SMALL (*)    Bacteria, UA RARE (*)    All other components within normal limits  LIPASE, BLOOD  PREGNANCY, URINE     EKG  None   RADIOLOGY None  PROCEDURES:  Critical Care performed: No    MEDICATIONS ORDERED IN ED: Medications - No data to display   IMPRESSION / MDM / ASSESSMENT AND PLAN / ED COURSE  I reviewed the triage vital signs and the nursing notes.                              Differential diagnosis includes, but is not limited to, pregnancy, irregular menstrual cycle.  Patient's presentation  is most consistent with acute presentation with potential threat to life or bodily function.   Patient presented to the emergency department today because of concerns for irregular menses and abdominal pain.  Patient's blood work without significant anemia.  Patient is neither hypotensive nor tachycardic.  This time I do think probably irregular menstrual cycle.  Will start patient on birth control.  Did discuss blood clot risk with patient. Encouraged patient to follow up with her ob gyn.      FINAL CLINICAL IMPRESSION(S) / ED DIAGNOSES   Final diagnoses:  Vaginal bleeding       Note:  This document was prepared using Dragon voice recognition software and may include unintentional dictation errors.    Floy Roberts, MD 09/07/24 [phone]

## 2024-09-24 ENCOUNTER — Other Ambulatory Visit: Payer: Self-pay

## 2024-09-24 ENCOUNTER — Emergency Department

## 2024-09-24 ENCOUNTER — Emergency Department
Admission: EM | Admit: 2024-09-24 | Discharge: 2024-09-24 | Disposition: A | Discharge status: Home / Self Care | Attending: Emergency Medicine | Admitting: Emergency Medicine

## 2024-09-24 ENCOUNTER — Encounter: Payer: Self-pay | Admitting: Emergency Medicine

## 2024-09-24 DIAGNOSIS — S8991XA Unspecified injury of right lower leg, initial encounter: Secondary | ICD-10-CM | POA: Diagnosis present

## 2024-09-24 DIAGNOSIS — S93402A Sprain of unspecified ligament of left ankle, initial encounter: Secondary | ICD-10-CM | POA: Insufficient documentation

## 2024-09-24 DIAGNOSIS — W1842XA Slipping, tripping and stumbling without falling due to stepping into hole or opening, initial encounter: Secondary | ICD-10-CM | POA: Insufficient documentation

## 2024-09-24 DIAGNOSIS — S8391XA Sprain of unspecified site of right knee, initial encounter: Secondary | ICD-10-CM | POA: Insufficient documentation

## 2024-09-24 MED ORDER — NAPROXEN 500 MG PO TABS: 500.0000 mg | ORAL_TABLET | Freq: Two times a day (BID) | ORAL | 2 refills | Status: AC
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
## 2024-09-26 ENCOUNTER — Encounter: Payer: Self-pay | Admitting: Obstetrics

## 2024-09-27 ENCOUNTER — Telehealth: Payer: Self-pay
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
## 2024-10-09 ENCOUNTER — Ambulatory Visit: Admitting: Obstetrics

## 2024-11-11 ENCOUNTER — Ambulatory Visit
Admission: EM | Admit: 2024-11-11 | Discharge: 2024-11-11 | Disposition: A | Discharge status: Home / Self Care | Attending: Emergency Medicine | Admitting: Emergency Medicine

## 2024-11-11 ENCOUNTER — Encounter: Payer: Self-pay | Admitting: Emergency Medicine

## 2024-11-11 DIAGNOSIS — R319 Hematuria, unspecified: Secondary | ICD-10-CM | POA: Insufficient documentation

## 2024-11-11 LAB — POCT URINE DIPSTICK
Bilirubin, UA: NEGATIVE
Glucose, UA: NEGATIVE mg/dL
Ketones, POC UA: NEGATIVE mg/dL
Leukocytes, UA: NEGATIVE
Nitrite, UA: NEGATIVE
POC PROTEIN,UA: NEGATIVE
Spec Grav, UA: 1.02 (ref 1.010–1.025)
Urobilinogen, UA: 0.2 U/dL
pH, UA: 7 (ref 5.0–8.0)

## 2024-11-11 MED ORDER — NITROFURANTOIN MONOHYD MACRO 100 MG PO CAPS: 100.0000 mg | ORAL_CAPSULE | Freq: Two times a day (BID) | ORAL | 0 refills | Status: AC

## 2024-11-11 NOTE — ED Triage Notes (Signed)
 Patient reports LLQ pain x 2 days. Patient reports blood in urine that started this morning. Patient has not taken anything for symptoms. Rates pain 5/10.

## 2024-11-11 NOTE — Discharge Instructions (Signed)
 Today you are evaluated for the blood in your urine and possible vaginal bleeding w  Urinalysis today is negative for bladder infection, has been sent to the lab for 3 days to determine if bacteria is truly present  You will be started on antibiotic as you are symptomatic, take Macrobid  twice daily for 5 days  It is also possible that your symptoms could be related to a kidney stone, definitive testing for kidney stone is a CT scan which is unavailable in the urgent care setting therefore if all of your testing today is negative and you continue to have symptoms please schedule follow-up appointment with your primary doctor  [facility] labs checking for yeast gonorrhea chlamydia trichomoniasis and bacterial vaginosis is pending for 2 to 3 days, you will be notified of positive test results only and additional medicine sent to the pharmacy as needed  You may use over-the-counter Azo to help minimize your symptoms until antibiotic removes bacteria, this medication will turn your urine orange  Increase your fluid intake through use of water   As always practice good hygiene, wiping front to back and avoidance of scented vaginal products to prevent further irritation  If symptoms continue to persist after use of medication or recur please follow-up with urgent care or your primary doctor as needed

## 2024-11-11 NOTE — ED Provider Notes (Signed)
 [name]    CSN: [id number] Arrival date & time: 11/11/24  1015      History   Chief Complaint Chief Complaint  Patient presents with   Abdominal Pain   Hematuria    HPI Krystal Murray is a 33 y.o. female.   Patient presents for evaluation of left lower quadrant abdominal pain beginning 2 days ago and hematuria beginning today, concern for possible vaginal bleeding as she has noticed urine when wiping as well.  No known exposures.  Last menstrual period 11/01/2024.  Denies frequency, dysuria, flank pain, fever, vaginal discharge itching or odor.   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    Medical history non-contributory    Rh negative status during pregnancy 03/25/2023   Rhogam 03/25/23     SGA (small for gestational age) 03/25/2023   Status post C-section 02/07/2018   x2     Supervision of other normal pregnancy, antepartum 10/26/2022              Nursing Staff    Provider      Office Location    [facility]    Dating     05/24/2023, by Last Menstrual Period      [facility] Model    GALERIUS.GANT ] Traditional  [ ]  Centering  [ ]  Mom-Baby Dyad    Anatomy US      12/29/22, SGA, serial growth scans      Language     English                Flu Vaccine     Declined 11/24/2022    Genetic/Carrier Screen     NIPS:   low risk female   AFP:     Horizon: alpha thal carr   UTI (lower urinary tract infection)     Patient Active Problem List   Diagnosis Date Noted   Hypopigmentation 06/30/2024   Alpha thalassemia silent carrier 12/29/2022   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    Past Surgical History:  Procedure Laterality Date   CESAREAN SECTION N/A 02/07/2018   Procedure: CESAREAN SECTION;  Surgeon: Lorence Ozell CROME, MD;   Location: [facility];  Service: Obstetrics;  Laterality: N/A;   CESAREAN SECTION  01/28/2020   CESAREAN SECTION N/A 05/18/2023   Procedure: CESAREAN SECTION;  Surgeon: Barbra Lang PARAS, DO;  Location: [facility];  Service: Obstetrics;  Laterality: N/A;    OB History     Gravida  7   Para  5   Term  5   Preterm  0   AB  2   Living  5      SAB  1   IAB  1   Ectopic  0   Multiple  0   Live Births  5            Home Medications    Prior to Admission medications   Medication Sig Start Date End Date Taking? Authorizing Provider  cyclobenzaprine  (FLEXERIL ) 10 MG tablet Take 1 tablet (10 mg total) by mouth at bedtime. Patient not taking: Reported on 07/28/2024 07/04/24   Teresa Shelba SAUNDERS, NP  dicyclomine  (BENTYL ) 10 MG capsule Take 1 capsule (10 mg total) by mouth 3 (three) times daily as needed. 09/07/24  Cleaster, Lauren A, PA-C  fluconazole  (DIFLUCAN ) 150 MG tablet Take 1 tablet (150 mg total) by mouth every three (3) days as needed for up to 2 doses. Patient not taking: Reported on 07/28/2024 07/04/24   Teresa Shelba SAUNDERS, NP  ibuprofen  (ADVIL ) 600 MG tablet Take 1 tablet (600 mg total) by mouth every 6 (six) hours as needed. 07/28/24   Corlis Burnard DEL, NP  metroNIDAZOLE  (FLAGYL ) 500 MG tablet Take 1 tablet (500 mg total) by mouth 2 (two) times daily. Patient not taking: Reported on 06/30/2024 05/20/24   Corlis Burnard DEL, NP  naproxen  (NAPROSYN ) 500 MG tablet Take 1 tablet (500 mg total) by mouth 2 (two) times daily with a meal. 09/24/24   Arlander Charleston, MD  norethindrone -ethinyl estradiol  (NECON) 0.5-35 MG-MCG tablet Take 1 tablet by mouth daily. 09/07/24 09/07/25  Cleaster Tinnie LABOR, PA-C  predniSONE  (STERAPRED UNI-PAK 21 TAB) 10 MG (21) TBPK tablet Take by mouth daily. Take 6 tabs by mouth daily  for 1 days, then 5 tabs for 1 days, then 4 tabs for 1 days, then 3 tabs for 1 days, 2 tabs for 1 days, then 1 tab by mouth daily for 1 days Patient not taking: Reported on 07/28/2024  07/04/24   Teresa Shelba SAUNDERS, NP  promethazine -dextromethorphan (PROMETHAZINE -DM) 6.25-15 MG/5ML syrup Take 5 mLs by mouth 4 (four) times daily as needed. 07/28/24   Corlis Burnard DEL, NP    Family History Family History  Problem Relation Age of Onset   Cancer Maternal Grandmother        breastt   Cancer Maternal Aunt        uterine   Diabetes Maternal Aunt    Hypertension Maternal Aunt    Cancer Cousin        leaukemia    Social History Social History   Tobacco Use   Smoking status: Never    Passive exposure: Never   Smokeless tobacco: Never  Vaping Use   Vaping status: Never Used  Substance Use Topics   Alcohol use: Yes    Comment: occ   Drug use: Not Currently    Types: Marijuana     Allergies   Patient has no known allergies.   Review of Systems Review of Systems  Genitourinary:  Positive for hematuria and pelvic pain. Negative for decreased urine volume, difficulty urinating, dyspareunia, dysuria, enuresis, flank pain, frequency, genital sores, menstrual problem, urgency, vaginal bleeding, vaginal discharge and vaginal pain.     Physical Exam Triage Vital Signs ED Triage Vitals  Encounter Vitals Group     BP 11/11/24 1102 118/68     Girls Systolic BP Percentile --      Girls Diastolic BP Percentile --      Boys Systolic BP Percentile --      Boys Diastolic BP Percentile --      Pulse Rate 11/11/24 1102 73     Resp 11/11/24 1102 18     Temp 11/11/24 1102 98.1 F (36.7 C)     Temp Source 11/11/24 1102 Oral     SpO2 11/11/24 1102 98 %     Weight --      Height --      Head Circumference --      Peak Flow --      Pain Score 11/11/24 1058 5     Pain Loc --      Pain Education --      Exclude from Growth Chart --    No data found.  Updated Vital Signs BP 118/68 (BP Location: Left Arm)   Pulse 73   Temp 98.1 F (36.7 C) (Oral)   Resp 18   LMP 11/01/2024 (Approximate)   SpO2 98%   Breastfeeding No   Visual Acuity Right Eye Distance:   Left Eye  Distance:   Bilateral Distance:    Right Eye Near:   Left Eye Near:    Bilateral Near:     Physical Exam Constitutional:      Appearance: Normal appearance.  Eyes:     Extraocular Movements: Extraocular movements intact.  Pulmonary:     Effort: Pulmonary effort is normal.  Abdominal:     Tenderness: There is abdominal tenderness in the suprapubic area. There is no right CVA tenderness, left CVA tenderness or guarding.  Neurological:     Mental Status: She is alert and oriented to person, place, and time.      UC Treatments / Results  Labs (all labs ordered are listed, but only abnormal results are displayed) Labs Reviewed  URINE CULTURE  POCT URINE DIPSTICK    EKG   Radiology No results found.  Procedures Procedures (including critical care time)  Medications Ordered in UC Medications - No data to display  Initial Impression / Assessment and Plan / UC Course  I have reviewed the triage vital signs and the nursing notes.  Pertinent labs & imaging results that were available during my care of the patient were reviewed by me and considered in my medical decision making (see chart for details).  Hematuria  Urinalysis showing hemoglobin, negative for nitrates and leukocytes, sent for culture, empirically placed on Macrobid  as she is symptomatic, alternative etiologies include vaginal infections versus kidney stone, discussed this with patient, STI labs pending and will treat per protocol, advised that if all testing today is negative and she remains symptomatic she is to follow-up with her family doctor for further evaluation and management, recommended over-the-counter medications and additional nonpharmacological supportive care Final Clinical Impressions(s) / UC Diagnoses   Final diagnoses:  Hematuria, unspecified type   Discharge Instructions   None    ED Prescriptions   None    PDMP not reviewed this encounter.   Teresa Shelba SAUNDERS, NP 11/11/24  1139

## 2024-11-12 ENCOUNTER — Ambulatory Visit: Payer: Self-pay | Admitting: Emergency Medicine

## 2024-11-12 LAB — URINE CULTURE: Culture: NO GROWTH

## 2024-11-14 LAB — CERVICOVAGINAL ANCILLARY ONLY
Bacterial Vaginitis (gardnerella): POSITIVE — AB
Candida Glabrata: NEGATIVE
Candida Vaginitis: NEGATIVE
Chlamydia: NEGATIVE
Comment: NEGATIVE
Comment: NEGATIVE
Comment: NEGATIVE
Comment: NEGATIVE
Comment: NEGATIVE
Comment: NORMAL
Neisseria Gonorrhea: NEGATIVE
Trichomonas: NEGATIVE

## 2024-11-14 MED ORDER — METRONIDAZOLE 500 MG PO TABS: 500.0000 mg | ORAL_TABLET | Freq: Two times a day (BID) | ORAL | 0 refills | Status: AC

## 2025-02-12 ENCOUNTER — Ambulatory Visit: Admitting: Dermatology
